# Patient Record
Sex: Female | Born: 1945
Health system: Southern US, Community
[De-identification: ages and names within clinical notes are randomized; demographics above are authoritative.]

## PROBLEM LIST (undated history)

## (undated) DIAGNOSIS — N3941 Urge incontinence: Secondary | ICD-10-CM

## (undated) DIAGNOSIS — Z8719 Personal history of other diseases of the digestive system: Secondary | ICD-10-CM

## (undated) DIAGNOSIS — R413 Other amnesia: Secondary | ICD-10-CM

## (undated) DIAGNOSIS — M1990 Unspecified osteoarthritis, unspecified site: Secondary | ICD-10-CM

## (undated) DIAGNOSIS — F329 Major depressive disorder, single episode, unspecified: Secondary | ICD-10-CM

## (undated) DIAGNOSIS — Z794 Long term (current) use of insulin: Secondary | ICD-10-CM

## (undated) DIAGNOSIS — Z8709 Personal history of other diseases of the respiratory system: Secondary | ICD-10-CM

## (undated) DIAGNOSIS — F319 Bipolar disorder, unspecified: Secondary | ICD-10-CM

## (undated) DIAGNOSIS — Z973 Presence of spectacles and contact lenses: Secondary | ICD-10-CM

## (undated) DIAGNOSIS — E785 Hyperlipidemia, unspecified: Secondary | ICD-10-CM

## (undated) DIAGNOSIS — F419 Anxiety disorder, unspecified: Secondary | ICD-10-CM

## (undated) DIAGNOSIS — Z853 Personal history of malignant neoplasm of breast: Secondary | ICD-10-CM

## (undated) DIAGNOSIS — M5030 Other cervical disc degeneration, unspecified cervical region: Secondary | ICD-10-CM

## (undated) DIAGNOSIS — Z923 Personal history of irradiation: Secondary | ICD-10-CM

## (undated) DIAGNOSIS — N393 Stress incontinence (female) (male): Secondary | ICD-10-CM

## (undated) DIAGNOSIS — E119 Type 2 diabetes mellitus without complications: Secondary | ICD-10-CM

## (undated) DIAGNOSIS — C801 Malignant (primary) neoplasm, unspecified: Secondary | ICD-10-CM

## (undated) DIAGNOSIS — I10 Essential (primary) hypertension: Secondary | ICD-10-CM

## (undated) DIAGNOSIS — E11319 Type 2 diabetes mellitus with unspecified diabetic retinopathy without macular edema: Secondary | ICD-10-CM

## (undated) DIAGNOSIS — N950 Postmenopausal bleeding: Secondary | ICD-10-CM

## (undated) DIAGNOSIS — F32A Depression, unspecified: Secondary | ICD-10-CM

## (undated) DIAGNOSIS — J45909 Unspecified asthma, uncomplicated: Secondary | ICD-10-CM

## (undated) DIAGNOSIS — M21379 Foot drop, unspecified foot: Secondary | ICD-10-CM

## (undated) HISTORY — PX: JOINT REPLACEMENT: SHX530

## (undated) HISTORY — PX: KNEE ARTHROSCOPY: SUR90

## (undated) HISTORY — DX: Other amnesia: R41.3

## (undated) HISTORY — PX: TONSILLECTOMY: SUR1361

## (undated) HISTORY — DX: Foot drop, unspecified foot: M21.379

---

## 1987-05-10 DIAGNOSIS — Z8719 Personal history of other diseases of the digestive system: Secondary | ICD-10-CM

## 1987-05-10 HISTORY — PX: DILATION AND CURETTAGE OF UTERUS: SHX78

## 1987-05-10 HISTORY — DX: Personal history of other diseases of the digestive system: Z87.19

## 1993-05-09 HISTORY — PX: BREAST LUMPECTOMY: SHX2

## 2001-09-04 ENCOUNTER — Encounter: Payer: Self-pay | Admitting: Family Medicine

## 2001-09-04 ENCOUNTER — Ambulatory Visit (HOSPITAL_COMMUNITY): Admission: RE | Admit: 2001-09-04 | Discharge: 2001-09-04 | Payer: Self-pay | Admitting: Family Medicine

## 2002-02-07 ENCOUNTER — Ambulatory Visit (HOSPITAL_COMMUNITY): Admission: RE | Admit: 2002-02-07 | Discharge: 2002-02-07 | Payer: Self-pay | Admitting: Gastroenterology

## 2002-05-22 ENCOUNTER — Encounter: Admission: RE | Admit: 2002-05-22 | Discharge: 2002-05-22 | Payer: Self-pay | Admitting: Family Medicine

## 2002-05-22 ENCOUNTER — Encounter: Payer: Self-pay | Admitting: Family Medicine

## 2002-09-16 ENCOUNTER — Encounter: Payer: Self-pay | Admitting: Family Medicine

## 2002-09-16 ENCOUNTER — Ambulatory Visit (HOSPITAL_COMMUNITY): Admission: RE | Admit: 2002-09-16 | Discharge: 2002-09-16 | Payer: Self-pay | Admitting: Family Medicine

## 2003-10-22 ENCOUNTER — Ambulatory Visit (HOSPITAL_COMMUNITY): Admission: RE | Admit: 2003-10-22 | Discharge: 2003-10-22 | Payer: Self-pay | Admitting: Family Medicine

## 2003-10-27 ENCOUNTER — Encounter: Admission: RE | Admit: 2003-10-27 | Discharge: 2003-10-27 | Payer: Self-pay | Admitting: Family Medicine

## 2004-01-27 ENCOUNTER — Encounter: Admission: RE | Admit: 2004-01-27 | Discharge: 2004-01-27 | Payer: Self-pay | Admitting: Family Medicine

## 2004-07-19 ENCOUNTER — Other Ambulatory Visit: Admission: RE | Admit: 2004-07-19 | Discharge: 2004-07-19 | Payer: Self-pay | Admitting: Family Medicine

## 2004-10-19 ENCOUNTER — Encounter: Admission: RE | Admit: 2004-10-19 | Discharge: 2004-10-19 | Payer: Self-pay | Admitting: Family Medicine

## 2005-01-14 ENCOUNTER — Encounter: Admission: RE | Admit: 2005-01-14 | Discharge: 2005-01-14 | Payer: Self-pay | Admitting: Family Medicine

## 2005-08-26 ENCOUNTER — Other Ambulatory Visit: Admission: RE | Admit: 2005-08-26 | Discharge: 2005-08-26 | Payer: Self-pay | Admitting: Family Medicine

## 2005-08-30 ENCOUNTER — Encounter: Admission: RE | Admit: 2005-08-30 | Discharge: 2005-08-30 | Payer: Self-pay | Admitting: Family Medicine

## 2005-10-27 ENCOUNTER — Encounter: Admission: RE | Admit: 2005-10-27 | Discharge: 2005-10-27 | Payer: Self-pay | Admitting: Family Medicine

## 2005-12-27 ENCOUNTER — Ambulatory Visit (HOSPITAL_COMMUNITY): Admission: RE | Admit: 2005-12-27 | Discharge: 2005-12-27 | Payer: Self-pay | Admitting: Orthopedic Surgery

## 2005-12-27 ENCOUNTER — Encounter: Payer: Self-pay | Admitting: Vascular Surgery

## 2006-09-18 ENCOUNTER — Other Ambulatory Visit: Admission: RE | Admit: 2006-09-18 | Discharge: 2006-09-18 | Payer: Self-pay | Admitting: Family Medicine

## 2006-12-04 ENCOUNTER — Encounter: Admission: RE | Admit: 2006-12-04 | Discharge: 2006-12-04 | Payer: Self-pay | Admitting: Family Medicine

## 2007-11-01 ENCOUNTER — Other Ambulatory Visit: Admission: RE | Admit: 2007-11-01 | Discharge: 2007-11-01 | Payer: Self-pay | Admitting: Family Medicine

## 2007-12-05 ENCOUNTER — Encounter: Admission: RE | Admit: 2007-12-05 | Discharge: 2007-12-05 | Payer: Self-pay | Admitting: Family Medicine

## 2008-01-09 ENCOUNTER — Encounter: Admission: RE | Admit: 2008-01-09 | Discharge: 2008-01-09 | Payer: Self-pay | Admitting: Family Medicine

## 2008-12-31 ENCOUNTER — Encounter: Admission: RE | Admit: 2008-12-31 | Discharge: 2008-12-31 | Payer: Self-pay | Admitting: Family Medicine

## 2009-11-04 ENCOUNTER — Other Ambulatory Visit: Admission: RE | Admit: 2009-11-04 | Discharge: 2009-11-04 | Payer: Self-pay | Admitting: Family Medicine

## 2009-12-16 ENCOUNTER — Encounter: Admission: RE | Admit: 2009-12-16 | Discharge: 2009-12-16 | Payer: Self-pay | Admitting: Family Medicine

## 2010-01-15 ENCOUNTER — Ambulatory Visit (HOSPITAL_COMMUNITY): Admission: RE | Admit: 2010-01-15 | Discharge: 2010-01-15 | Payer: Self-pay | Admitting: Family Medicine

## 2010-09-24 NOTE — Op Note (Signed)
   NAME:  [name], [name]                          ACCOUNT NO.:  [id number]   MEDICAL RECORD NO.:  [id number]                   PATIENT TYPE:  AMB   LOCATION:  ENDO                                 FACILITY:  [facility]   PHYSICIAN:  James L. Malon Kindle., M.D.          DATE OF BIRTH:  1945/09/30   DATE OF PROCEDURE:  02/07/2002  DATE OF DISCHARGE:                                 OPERATIVE REPORT   PROCEDURE:  Colonoscopy.   MEDICATIONS:  Fentanyl 50 mcg, Versed 5 mg IV.   INDICATIONS:  Colon cancer screening and change in bowel habits and  abdominal bloating.   DESCRIPTION OF PROCEDURE:  The procedure had been explained to the patient  and consent obtained.  With the patient in the left lateral decubitus  position, the Olympus pediatric video colonoscope inserted and advanced  under direct visualization.  The prep was adequate.  There was some sticky,  adherent stool.  We were able to reach the cecum and the ileocecal valve was  seen, as well as the appendiceal orifice.  The scope was withdrawn and the  cecum, ascending colon, hepatic flexure, transverse colon, splenic flexure,  descending and sigmoid colon were seen well.  Moderate diverticulosis in the  sigmoid colon.  No polyps or other lesions seen.  The scope was withdrawn.  The patient tolerated the procedure well, maintained on low-flow oxygen and  pulse oximeter throughout the procedure.   ASSESSMENT:  1. No evidence of colon polyps.  2. Moderate diverticulosis.   PLAN:  Will give a diverticulosis handout sheet and check the stool for  blood on a yearly basis.                                                James L. Malon Kindle., M.D.    Waldron Session  D:  02/07/2002  T:  02/07/2002  Job:  [id number]   cc:   Duncan Dull, M.D.  [address]  [place]  [place] [address]  Fax: [phone]

## 2010-12-20 ENCOUNTER — Other Ambulatory Visit (HOSPITAL_COMMUNITY): Payer: Self-pay | Admitting: Family Medicine

## 2010-12-20 DIAGNOSIS — Z1231 Encounter for screening mammogram for malignant neoplasm of breast: Secondary | ICD-10-CM

## 2011-01-17 ENCOUNTER — Ambulatory Visit (HOSPITAL_COMMUNITY)
Admission: RE | Admit: 2011-01-17 | Discharge: 2011-01-17 | Disposition: A | Discharge status: Home / Self Care | Payer: Federal, State, Local not specified - PPO | Source: Ambulatory Visit | Attending: Family Medicine | Admitting: Family Medicine

## 2011-01-17 DIAGNOSIS — Z1231 Encounter for screening mammogram for malignant neoplasm of breast: Secondary | ICD-10-CM | POA: Insufficient documentation

## 2011-05-18 DIAGNOSIS — F200 Paranoid schizophrenia: Secondary | ICD-10-CM | POA: Diagnosis not present

## 2011-07-26 DIAGNOSIS — E7800 Pure hypercholesterolemia, unspecified: Secondary | ICD-10-CM | POA: Diagnosis not present

## 2011-07-26 DIAGNOSIS — R809 Proteinuria, unspecified: Secondary | ICD-10-CM | POA: Diagnosis not present

## 2011-07-26 DIAGNOSIS — I10 Essential (primary) hypertension: Secondary | ICD-10-CM | POA: Diagnosis not present

## 2011-08-03 DIAGNOSIS — M79609 Pain in unspecified limb: Secondary | ICD-10-CM | POA: Diagnosis not present

## 2011-08-03 DIAGNOSIS — B351 Tinea unguium: Secondary | ICD-10-CM | POA: Diagnosis not present

## 2011-10-27 ENCOUNTER — Other Ambulatory Visit: Payer: Self-pay | Admitting: Family Medicine

## 2011-10-27 ENCOUNTER — Other Ambulatory Visit (HOSPITAL_COMMUNITY)
Admission: RE | Admit: 2011-10-27 | Discharge: 2011-10-27 | Disposition: A | Discharge status: Home / Self Care | Payer: Medicare Other | Source: Ambulatory Visit | Attending: Family Medicine | Admitting: Family Medicine

## 2011-10-27 DIAGNOSIS — M899 Disorder of bone, unspecified: Secondary | ICD-10-CM | POA: Diagnosis not present

## 2011-10-27 DIAGNOSIS — Z124 Encounter for screening for malignant neoplasm of cervix: Secondary | ICD-10-CM | POA: Insufficient documentation

## 2011-10-27 DIAGNOSIS — Z0000 Encounter for general adult medical examination without abnormal findings: Secondary | ICD-10-CM | POA: Diagnosis not present

## 2011-10-27 DIAGNOSIS — Z79899 Other long term (current) drug therapy: Secondary | ICD-10-CM | POA: Diagnosis not present

## 2011-10-27 DIAGNOSIS — Z1211 Encounter for screening for malignant neoplasm of colon: Secondary | ICD-10-CM | POA: Diagnosis not present

## 2011-10-27 DIAGNOSIS — E1169 Type 2 diabetes mellitus with other specified complication: Secondary | ICD-10-CM | POA: Diagnosis not present

## 2011-10-27 DIAGNOSIS — E7800 Pure hypercholesterolemia, unspecified: Secondary | ICD-10-CM | POA: Diagnosis not present

## 2011-12-02 DIAGNOSIS — G5760 Lesion of plantar nerve, unspecified lower limb: Secondary | ICD-10-CM | POA: Diagnosis not present

## 2011-12-02 DIAGNOSIS — M19079 Primary osteoarthritis, unspecified ankle and foot: Secondary | ICD-10-CM | POA: Diagnosis not present

## 2011-12-02 DIAGNOSIS — M779 Enthesopathy, unspecified: Secondary | ICD-10-CM | POA: Diagnosis not present

## 2011-12-02 DIAGNOSIS — M79609 Pain in unspecified limb: Secondary | ICD-10-CM | POA: Diagnosis not present

## 2011-12-19 ENCOUNTER — Other Ambulatory Visit (HOSPITAL_COMMUNITY): Payer: Self-pay | Admitting: Family Medicine

## 2011-12-19 DIAGNOSIS — Z1231 Encounter for screening mammogram for malignant neoplasm of breast: Secondary | ICD-10-CM

## 2011-12-23 DIAGNOSIS — M899 Disorder of bone, unspecified: Secondary | ICD-10-CM | POA: Diagnosis not present

## 2012-01-24 ENCOUNTER — Ambulatory Visit (HOSPITAL_COMMUNITY)
Admission: RE | Admit: 2012-01-24 | Discharge: 2012-01-24 | Disposition: A | Discharge status: Home / Self Care | Payer: Medicare Other | Source: Ambulatory Visit | Attending: Family Medicine | Admitting: Family Medicine

## 2012-01-24 DIAGNOSIS — Z1231 Encounter for screening mammogram for malignant neoplasm of breast: Secondary | ICD-10-CM | POA: Diagnosis not present

## 2012-01-26 DIAGNOSIS — E119 Type 2 diabetes mellitus without complications: Secondary | ICD-10-CM | POA: Diagnosis not present

## 2012-01-26 DIAGNOSIS — E7800 Pure hypercholesterolemia, unspecified: Secondary | ICD-10-CM | POA: Diagnosis not present

## 2012-02-02 DIAGNOSIS — I10 Essential (primary) hypertension: Secondary | ICD-10-CM | POA: Diagnosis not present

## 2012-02-02 DIAGNOSIS — Z23 Encounter for immunization: Secondary | ICD-10-CM | POA: Diagnosis not present

## 2012-02-02 DIAGNOSIS — E7800 Pure hypercholesterolemia, unspecified: Secondary | ICD-10-CM | POA: Diagnosis not present

## 2012-02-02 DIAGNOSIS — R809 Proteinuria, unspecified: Secondary | ICD-10-CM | POA: Diagnosis not present

## 2012-02-14 DIAGNOSIS — M1710 Unilateral primary osteoarthritis, unspecified knee: Secondary | ICD-10-CM | POA: Diagnosis not present

## 2012-03-14 DIAGNOSIS — H52209 Unspecified astigmatism, unspecified eye: Secondary | ICD-10-CM | POA: Diagnosis not present

## 2012-03-14 DIAGNOSIS — E119 Type 2 diabetes mellitus without complications: Secondary | ICD-10-CM | POA: Diagnosis not present

## 2012-08-01 DIAGNOSIS — R809 Proteinuria, unspecified: Secondary | ICD-10-CM | POA: Diagnosis not present

## 2012-08-01 DIAGNOSIS — E7800 Pure hypercholesterolemia, unspecified: Secondary | ICD-10-CM | POA: Diagnosis not present

## 2012-08-08 DIAGNOSIS — I10 Essential (primary) hypertension: Secondary | ICD-10-CM | POA: Diagnosis not present

## 2012-08-08 DIAGNOSIS — R809 Proteinuria, unspecified: Secondary | ICD-10-CM | POA: Diagnosis not present

## 2012-08-08 DIAGNOSIS — E7800 Pure hypercholesterolemia, unspecified: Secondary | ICD-10-CM | POA: Diagnosis not present

## 2012-10-02 DIAGNOSIS — Z1211 Encounter for screening for malignant neoplasm of colon: Secondary | ICD-10-CM | POA: Diagnosis not present

## 2012-11-27 DIAGNOSIS — M1710 Unilateral primary osteoarthritis, unspecified knee: Secondary | ICD-10-CM | POA: Diagnosis not present

## 2012-12-10 DIAGNOSIS — R809 Proteinuria, unspecified: Secondary | ICD-10-CM | POA: Diagnosis not present

## 2012-12-10 DIAGNOSIS — I10 Essential (primary) hypertension: Secondary | ICD-10-CM | POA: Diagnosis not present

## 2012-12-10 DIAGNOSIS — E7800 Pure hypercholesterolemia, unspecified: Secondary | ICD-10-CM | POA: Diagnosis not present

## 2013-01-08 DIAGNOSIS — M1710 Unilateral primary osteoarthritis, unspecified knee: Secondary | ICD-10-CM | POA: Diagnosis not present

## 2013-01-15 DIAGNOSIS — M1710 Unilateral primary osteoarthritis, unspecified knee: Secondary | ICD-10-CM | POA: Diagnosis not present

## 2013-01-22 DIAGNOSIS — M1710 Unilateral primary osteoarthritis, unspecified knee: Secondary | ICD-10-CM | POA: Diagnosis not present

## 2013-02-19 DIAGNOSIS — M1710 Unilateral primary osteoarthritis, unspecified knee: Secondary | ICD-10-CM | POA: Diagnosis not present

## 2013-02-20 DIAGNOSIS — E782 Mixed hyperlipidemia: Secondary | ICD-10-CM | POA: Diagnosis not present

## 2013-02-20 DIAGNOSIS — N952 Postmenopausal atrophic vaginitis: Secondary | ICD-10-CM | POA: Diagnosis not present

## 2013-02-20 DIAGNOSIS — Z23 Encounter for immunization: Secondary | ICD-10-CM | POA: Diagnosis not present

## 2013-02-20 DIAGNOSIS — Z853 Personal history of malignant neoplasm of breast: Secondary | ICD-10-CM | POA: Diagnosis not present

## 2013-02-20 DIAGNOSIS — Z79899 Other long term (current) drug therapy: Secondary | ICD-10-CM | POA: Diagnosis not present

## 2013-02-20 DIAGNOSIS — E119 Type 2 diabetes mellitus without complications: Secondary | ICD-10-CM | POA: Diagnosis not present

## 2013-02-20 DIAGNOSIS — M899 Disorder of bone, unspecified: Secondary | ICD-10-CM | POA: Diagnosis not present

## 2013-02-20 DIAGNOSIS — Z0000 Encounter for general adult medical examination without abnormal findings: Secondary | ICD-10-CM | POA: Diagnosis not present

## 2013-03-13 DIAGNOSIS — D235 Other benign neoplasm of skin of trunk: Secondary | ICD-10-CM | POA: Diagnosis not present

## 2013-03-18 ENCOUNTER — Other Ambulatory Visit (HOSPITAL_COMMUNITY): Payer: Self-pay | Admitting: Family Medicine

## 2013-03-18 DIAGNOSIS — Z1231 Encounter for screening mammogram for malignant neoplasm of breast: Secondary | ICD-10-CM

## 2013-03-25 DIAGNOSIS — E7800 Pure hypercholesterolemia, unspecified: Secondary | ICD-10-CM | POA: Diagnosis not present

## 2013-03-27 DIAGNOSIS — Z124 Encounter for screening for malignant neoplasm of cervix: Secondary | ICD-10-CM | POA: Diagnosis not present

## 2013-03-27 DIAGNOSIS — Z1231 Encounter for screening mammogram for malignant neoplasm of breast: Secondary | ICD-10-CM | POA: Diagnosis not present

## 2013-03-27 DIAGNOSIS — Z9189 Other specified personal risk factors, not elsewhere classified: Secondary | ICD-10-CM | POA: Diagnosis not present

## 2013-03-29 ENCOUNTER — Ambulatory Visit (HOSPITAL_COMMUNITY): Payer: Federal, State, Local not specified - PPO

## 2013-04-11 DIAGNOSIS — R809 Proteinuria, unspecified: Secondary | ICD-10-CM | POA: Diagnosis not present

## 2013-04-11 DIAGNOSIS — E7800 Pure hypercholesterolemia, unspecified: Secondary | ICD-10-CM | POA: Diagnosis not present

## 2013-04-15 DIAGNOSIS — R809 Proteinuria, unspecified: Secondary | ICD-10-CM | POA: Diagnosis not present

## 2013-04-15 DIAGNOSIS — E7800 Pure hypercholesterolemia, unspecified: Secondary | ICD-10-CM | POA: Diagnosis not present

## 2013-04-15 DIAGNOSIS — I10 Essential (primary) hypertension: Secondary | ICD-10-CM | POA: Diagnosis not present

## 2013-05-07 DIAGNOSIS — E119 Type 2 diabetes mellitus without complications: Secondary | ICD-10-CM | POA: Diagnosis not present

## 2013-05-07 DIAGNOSIS — H52209 Unspecified astigmatism, unspecified eye: Secondary | ICD-10-CM | POA: Diagnosis not present

## 2013-05-09 HISTORY — PX: KNEE ARTHROSCOPY: SUR90

## 2013-08-07 DIAGNOSIS — IMO0002 Reserved for concepts with insufficient information to code with codable children: Secondary | ICD-10-CM | POA: Diagnosis not present

## 2013-08-07 DIAGNOSIS — M1710 Unilateral primary osteoarthritis, unspecified knee: Secondary | ICD-10-CM | POA: Diagnosis not present

## 2013-08-13 DIAGNOSIS — M1710 Unilateral primary osteoarthritis, unspecified knee: Secondary | ICD-10-CM | POA: Diagnosis not present

## 2013-08-13 DIAGNOSIS — IMO0002 Reserved for concepts with insufficient information to code with codable children: Secondary | ICD-10-CM | POA: Diagnosis not present

## 2013-08-15 DIAGNOSIS — E7800 Pure hypercholesterolemia, unspecified: Secondary | ICD-10-CM | POA: Diagnosis not present

## 2013-08-15 DIAGNOSIS — IMO0001 Reserved for inherently not codable concepts without codable children: Secondary | ICD-10-CM | POA: Diagnosis not present

## 2013-10-21 DIAGNOSIS — IMO0001 Reserved for inherently not codable concepts without codable children: Secondary | ICD-10-CM | POA: Diagnosis not present

## 2013-10-21 DIAGNOSIS — E7800 Pure hypercholesterolemia, unspecified: Secondary | ICD-10-CM | POA: Diagnosis not present

## 2013-10-21 DIAGNOSIS — I10 Essential (primary) hypertension: Secondary | ICD-10-CM | POA: Diagnosis not present

## 2013-10-21 DIAGNOSIS — R809 Proteinuria, unspecified: Secondary | ICD-10-CM | POA: Diagnosis not present

## 2013-11-06 DIAGNOSIS — M1710 Unilateral primary osteoarthritis, unspecified knee: Secondary | ICD-10-CM | POA: Diagnosis not present

## 2013-11-13 DIAGNOSIS — M1710 Unilateral primary osteoarthritis, unspecified knee: Secondary | ICD-10-CM | POA: Diagnosis not present

## 2013-11-20 DIAGNOSIS — M1710 Unilateral primary osteoarthritis, unspecified knee: Secondary | ICD-10-CM | POA: Diagnosis not present

## 2013-12-16 DIAGNOSIS — N9089 Other specified noninflammatory disorders of vulva and perineum: Secondary | ICD-10-CM | POA: Diagnosis not present

## 2014-01-09 DIAGNOSIS — M1710 Unilateral primary osteoarthritis, unspecified knee: Secondary | ICD-10-CM | POA: Diagnosis not present

## 2014-02-14 DIAGNOSIS — E780 Pure hypercholesterolemia: Secondary | ICD-10-CM | POA: Diagnosis not present

## 2014-02-14 DIAGNOSIS — I10 Essential (primary) hypertension: Secondary | ICD-10-CM | POA: Diagnosis not present

## 2014-02-14 DIAGNOSIS — R809 Proteinuria, unspecified: Secondary | ICD-10-CM | POA: Diagnosis not present

## 2014-02-14 DIAGNOSIS — E1165 Type 2 diabetes mellitus with hyperglycemia: Secondary | ICD-10-CM | POA: Diagnosis not present

## 2014-02-21 DIAGNOSIS — Z23 Encounter for immunization: Secondary | ICD-10-CM | POA: Diagnosis not present

## 2014-03-05 DIAGNOSIS — D225 Melanocytic nevi of trunk: Secondary | ICD-10-CM | POA: Diagnosis not present

## 2014-03-05 DIAGNOSIS — L814 Other melanin hyperpigmentation: Secondary | ICD-10-CM | POA: Diagnosis not present

## 2014-03-05 DIAGNOSIS — L821 Other seborrheic keratosis: Secondary | ICD-10-CM | POA: Diagnosis not present

## 2014-03-05 DIAGNOSIS — L817 Pigmented purpuric dermatosis: Secondary | ICD-10-CM | POA: Diagnosis not present

## 2014-03-06 DIAGNOSIS — F339 Major depressive disorder, recurrent, unspecified: Secondary | ICD-10-CM | POA: Diagnosis not present

## 2014-03-06 DIAGNOSIS — Z0000 Encounter for general adult medical examination without abnormal findings: Secondary | ICD-10-CM | POA: Diagnosis not present

## 2014-03-06 DIAGNOSIS — Z23 Encounter for immunization: Secondary | ICD-10-CM | POA: Diagnosis not present

## 2014-03-06 DIAGNOSIS — E782 Mixed hyperlipidemia: Secondary | ICD-10-CM | POA: Diagnosis not present

## 2014-03-06 DIAGNOSIS — M8580 Other specified disorders of bone density and structure, unspecified site: Secondary | ICD-10-CM | POA: Diagnosis not present

## 2014-03-06 DIAGNOSIS — E1165 Type 2 diabetes mellitus with hyperglycemia: Secondary | ICD-10-CM | POA: Diagnosis not present

## 2014-03-06 DIAGNOSIS — I10 Essential (primary) hypertension: Secondary | ICD-10-CM | POA: Diagnosis not present

## 2014-04-25 DIAGNOSIS — Z1231 Encounter for screening mammogram for malignant neoplasm of breast: Secondary | ICD-10-CM | POA: Diagnosis not present

## 2014-04-25 DIAGNOSIS — Z01419 Encounter for gynecological examination (general) (routine) without abnormal findings: Secondary | ICD-10-CM | POA: Diagnosis not present

## 2014-05-07 DIAGNOSIS — E119 Type 2 diabetes mellitus without complications: Secondary | ICD-10-CM | POA: Diagnosis not present

## 2014-05-07 DIAGNOSIS — H524 Presbyopia: Secondary | ICD-10-CM | POA: Diagnosis not present

## 2014-05-07 DIAGNOSIS — H52202 Unspecified astigmatism, left eye: Secondary | ICD-10-CM | POA: Diagnosis not present

## 2014-06-13 DIAGNOSIS — E11359 Type 2 diabetes mellitus with proliferative diabetic retinopathy without macular edema: Secondary | ICD-10-CM | POA: Diagnosis not present

## 2014-06-13 DIAGNOSIS — Z01810 Encounter for preprocedural cardiovascular examination: Secondary | ICD-10-CM | POA: Diagnosis not present

## 2014-06-13 DIAGNOSIS — Z853 Personal history of malignant neoplasm of breast: Secondary | ICD-10-CM | POA: Diagnosis not present

## 2014-06-13 DIAGNOSIS — E785 Hyperlipidemia, unspecified: Secondary | ICD-10-CM | POA: Diagnosis not present

## 2014-06-17 ENCOUNTER — Ambulatory Visit: Payer: Self-pay | Admitting: Orthopedic Surgery

## 2014-06-17 DIAGNOSIS — E780 Pure hypercholesterolemia: Secondary | ICD-10-CM | POA: Diagnosis not present

## 2014-06-17 DIAGNOSIS — R809 Proteinuria, unspecified: Secondary | ICD-10-CM | POA: Diagnosis not present

## 2014-06-17 DIAGNOSIS — E1165 Type 2 diabetes mellitus with hyperglycemia: Secondary | ICD-10-CM | POA: Diagnosis not present

## 2014-06-26 DIAGNOSIS — E11359 Type 2 diabetes mellitus with proliferative diabetic retinopathy without macular edema: Secondary | ICD-10-CM | POA: Diagnosis not present

## 2014-06-26 DIAGNOSIS — E785 Hyperlipidemia, unspecified: Secondary | ICD-10-CM | POA: Diagnosis not present

## 2014-06-26 DIAGNOSIS — Z01810 Encounter for preprocedural cardiovascular examination: Secondary | ICD-10-CM | POA: Diagnosis not present

## 2014-06-26 DIAGNOSIS — Z853 Personal history of malignant neoplasm of breast: Secondary | ICD-10-CM | POA: Diagnosis not present

## 2014-06-30 ENCOUNTER — Other Ambulatory Visit (HOSPITAL_COMMUNITY): Payer: Self-pay | Admitting: *Deleted

## 2014-06-30 NOTE — Patient Instructions (Addendum)
[name]  06/30/2014   Your procedure is scheduled on: 07/07/14   Report to [facility] Main  Entrance and follow signs to               [place] at 8:30 AM.   Call this number if you have problems the morning of surgery [phone]   Remember:  Do not eat food or drink liquids :After Midnight.    Take these medicines the morning of surgery with A SIP OF WATER: ALLEGRA IF NEEDED                                You may not have any metal on your body including hair pins and              piercings  Do not wear jewelry, make-up, lotions, powders or perfumes.             Do not wear nail polish.  Do not shave  48 hours prior to surgery.              Men may shave face and neck.   Do not bring valuables to the hospital. [place].  Contacts, dentures or bridgework may not be worn into surgery.  Leave suitcase in the car. After surgery it may be brought to your room.     Patients discharged the day of surgery will not be allowed to drive home.  Name and phone number of your driver:  Special Instructions: N/A              Please read over the following fact sheets you were given: _____________________________________________________________________                                                     [place]  Before surgery, you can play an important role.  Because skin is not sterile, your skin needs to be as free of germs as possible.  You can reduce the number of germs on your skin by washing with CHG (chlorahexidine gluconate) soap before surgery.  CHG is an antiseptic cleaner which kills germs and bonds with the skin to continue killing germs even after washing. Please DO NOT use if you have an allergy to CHG or antibacterial soaps.  If your skin becomes reddened/irritated stop using the CHG and inform your nurse when you arrive at Short Stay. Do not shave  (including legs and underarms) for at least 48 hours prior to the first CHG shower.  You may shave your face. Please follow these instructions carefully:   1.  Shower with CHG Soap the night before surgery and the  morning of Surgery.   2.  If you choose to wash your hair, wash your hair first as usual with your  normal  Shampoo.   3.  After you shampoo, rinse your hair and body thoroughly to remove the  shampoo.  4.  Use CHG as you would any other liquid soap.  You can apply chg directly  to the skin and wash . Gently wash with scrungie or clean wascloth    5.  Apply the CHG Soap to your body ONLY FROM THE NECK DOWN.   Do not use on open                           Wound or open sores. Avoid contact with eyes, ears mouth and genitals (private parts).                        Genitals (private parts) with your normal soap.              6.  Wash thoroughly, paying special attention to the area where your surgery  will be performed.   7.  Thoroughly rinse your body with warm water from the neck down.   8.  DO NOT shower/wash with your normal soap after using and rinsing off  the CHG Soap .                9.  Pat yourself dry with a clean towel.             10.  Wear clean pajamas.             11.  Place clean sheets on your bed the night of your first shower and do not  sleep with pets.  Day of Surgery : Do not apply any lotions/deodorants the morning of surgery.  Please wear clean clothes to the hospital/surgery center.  FAILURE TO FOLLOW THESE INSTRUCTIONS MAY RESULT IN THE CANCELLATION OF YOUR SURGERY    PATIENT SIGNATURE_________________________________  ______________________________________________________________________     [name]  An incentive spirometer is a tool that can help keep your lungs clear and active. This tool measures how well you are filling your lungs with each breath. Taking long deep breaths may help  reverse or decrease the chance of developing breathing (pulmonary) problems (especially infection) following:  A long period of time when you are unable to move or be active. BEFORE THE PROCEDURE   If the spirometer includes an indicator to show your best effort, your nurse or respiratory therapist will set it to a desired goal.  If possible, sit up straight or lean slightly forward. Try not to slouch.  Hold the incentive spirometer in an upright position. INSTRUCTIONS FOR USE   Sit on the edge of your bed if possible, or sit up as far as you can in bed or on a chair.  Hold the incentive spirometer in an upright position.  Breathe out normally.  Place the mouthpiece in your mouth and seal your lips tightly around it.  Breathe in slowly and as deeply as possible, raising the piston or the ball toward the top of the column.  Hold your breath for 3-5 seconds or for as long as possible. Allow the piston or ball to fall to the bottom of the column.  Remove the mouthpiece from your mouth and breathe out normally.  Rest for a few seconds and repeat Steps 1 through 7 at least 10 times every 1-2 hours when you are awake. Take your time and take a few normal breaths between deep breaths.  The spirometer may include an indicator to show your best effort. Use the indicator as a goal to work toward during  each repetition.  After each set of 10 deep breaths, practice coughing to be sure your lungs are clear. If you have an incision (the cut made at the time of surgery), support your incision when coughing by placing a pillow or rolled up towels firmly against it. Once you are able to get out of bed, walk around indoors and cough well. You may stop using the incentive spirometer when instructed by your caregiver.  RISKS AND COMPLICATIONS  Take your time so you do not get dizzy or light-headed.  If you are in pain, you may need to take or ask for pain medication before doing incentive spirometry.  It is harder to take a deep breath if you are having pain. AFTER USE  Rest and breathe slowly and easily.  It can be helpful to keep track of a log of your progress. Your caregiver can provide you with a simple table to help with this. If you are using the spirometer at home, follow these instructions: [place] IF:   You are having difficultly using the spirometer.  You have trouble using the spirometer as often as instructed.  Your pain medication is not giving enough relief while using the spirometer.  You develop fever of 100.5 F (38.1 C) or higher. SEEK IMMEDIATE MEDICAL CARE IF:   You cough up bloody sputum that had not been present before.  You develop fever of 102 F (38.9 C) or greater.  You develop worsening pain at or near the incision site. MAKE SURE YOU:   Understand these instructions.  Will watch your condition.  Will get help right away if you are not doing well or get worse. Document Released: 09/05/2006 Document Revised: 07/18/2011 Document Reviewed: 11/06/2006 Exit[employer] Patient Information 2014 [employer], [place].   ________________________________________________________________________  WHAT IS A BLOOD TRANSFUSION? Blood Transfusion Information  A transfusion is the replacement of blood or some of its parts. Blood is made up of multiple cells which provide different functions.  Red blood cells carry oxygen and are used for blood loss replacement.  White blood cells fight against infection.  Platelets control bleeding.  Plasma helps clot blood.  Other blood products are available for specialized needs, such as hemophilia or other clotting disorders. BEFORE THE TRANSFUSION  Who gives blood for transfusions?   Healthy volunteers who are fully evaluated to make sure their blood is safe. This is blood bank blood. Transfusion therapy is the safest it has ever been in the practice of medicine. Before blood is taken from a donor, a complete  history is taken to make sure that person has no history of diseases nor engages in risky social behavior (examples are intravenous drug use or sexual activity with multiple partners). The donor's travel history is screened to minimize risk of transmitting infections, such as malaria. The donated blood is tested for signs of infectious diseases, such as HIV and hepatitis. The blood is then tested to be sure it is compatible with you in order to minimize the chance of a transfusion reaction. If you or a relative donates blood, this is often done in anticipation of surgery and is not appropriate for emergency situations. It takes many days to process the donated blood. RISKS AND COMPLICATIONS Although transfusion therapy is very safe and saves many lives, the main dangers of transfusion include:   Getting an infectious disease.  Developing a transfusion reaction. This is an allergic reaction to something in the blood you were given. Every precaution is taken to prevent  this. The decision to have a blood transfusion has been considered carefully by your caregiver before blood is given. Blood is not given unless the benefits outweigh the risks. AFTER THE TRANSFUSION  Right after receiving a blood transfusion, you will usually feel much better and more energetic. This is especially true if your red blood cells have gotten low (anemic). The transfusion raises the level of the red blood cells which carry oxygen, and this usually causes an energy increase.  The nurse administering the transfusion will monitor you carefully for complications. HOME CARE INSTRUCTIONS  No special instructions are needed after a transfusion. You may find your energy is better. Speak with your caregiver about any limitations on activity for underlying diseases you may have. SEEK MEDICAL CARE IF:   Your condition is not improving after your transfusion.  You develop redness or irritation at the intravenous (IV) site. SEEK  IMMEDIATE MEDICAL CARE IF:  Any of the following symptoms occur over the next 12 hours:  Shaking chills.  You have a temperature by mouth above 102 F (38.9 C), not controlled by medicine.  Chest, back, or muscle pain.  People around you feel you are not acting correctly or are confused.  Shortness of breath or difficulty breathing.  Dizziness and fainting.  You get a rash or develop hives.  You have a decrease in urine output.  Your urine turns a dark color or changes to pink, red, or brown. Any of the following symptoms occur over the next 10 days:  You have a temperature by mouth above 102 F (38.9 C), not controlled by medicine.  Shortness of breath.  Weakness after normal activity.  The white part of the eye turns yellow (jaundice).  You have a decrease in the amount of urine or are urinating less often.  Your urine turns a dark color or changes to pink, red, or brown. Document Released: 04/22/2000 Document Revised: 07/18/2011 Document Reviewed: 12/10/2007 [facility] Patient Information 2014 [place], [place].  _______________________________________________________________________

## 2014-07-01 ENCOUNTER — Encounter (HOSPITAL_COMMUNITY)
Admission: RE | Admit: 2014-07-01 | Discharge: 2014-07-01 | Disposition: A | Discharge status: Home / Self Care | Payer: Medicare Other | Source: Ambulatory Visit | Attending: Orthopedic Surgery | Admitting: Orthopedic Surgery

## 2014-07-01 ENCOUNTER — Encounter (HOSPITAL_COMMUNITY): Payer: Self-pay

## 2014-07-01 DIAGNOSIS — M179 Osteoarthritis of knee, unspecified: Secondary | ICD-10-CM | POA: Insufficient documentation

## 2014-07-01 DIAGNOSIS — Z01818 Encounter for other preprocedural examination: Secondary | ICD-10-CM | POA: Diagnosis not present

## 2014-07-01 HISTORY — DX: Bipolar disorder, unspecified: F31.9

## 2014-07-01 HISTORY — DX: Anxiety disorder, unspecified: F41.9

## 2014-07-01 HISTORY — DX: Personal history of malignant neoplasm of breast: Z85.3

## 2014-07-01 HISTORY — DX: Hyperlipidemia, unspecified: E78.5

## 2014-07-01 HISTORY — DX: Major depressive disorder, single episode, unspecified: F32.9

## 2014-07-01 HISTORY — DX: Type 2 diabetes mellitus without complications: E11.9

## 2014-07-01 HISTORY — DX: Stress incontinence (female) (male): N39.3

## 2014-07-01 HISTORY — DX: Unspecified osteoarthritis, unspecified site: M19.90

## 2014-07-01 HISTORY — DX: Personal history of other diseases of the digestive system: Z87.19

## 2014-07-01 HISTORY — DX: Essential (primary) hypertension: I10

## 2014-07-01 HISTORY — DX: Malignant (primary) neoplasm, unspecified: C80.1

## 2014-07-01 HISTORY — DX: Depression, unspecified: F32.A

## 2014-07-01 LAB — URINALYSIS, ROUTINE W REFLEX MICROSCOPIC
BILIRUBIN URINE: NEGATIVE
GLUCOSE, UA: NEGATIVE mg/dL
HGB URINE DIPSTICK: NEGATIVE
KETONES UR: NEGATIVE mg/dL
Leukocytes, UA: NEGATIVE
Nitrite: NEGATIVE
Protein, ur: NEGATIVE mg/dL
Specific Gravity, Urine: 1.014 (ref 1.005–1.030)
Urobilinogen, UA: 0.2 mg/dL (ref 0.0–1.0)
pH: 5.5 (ref 5.0–8.0)

## 2014-07-01 LAB — PROTIME-INR
INR: 0.92 (ref 0.00–1.49)
Prothrombin Time: 12.4 seconds (ref 11.6–15.2)

## 2014-07-01 LAB — CBC
HCT: 36.7 % (ref 36.0–46.0)
Hemoglobin: 12 g/dL (ref 12.0–15.0)
MCH: 28.1 pg (ref 26.0–34.0)
MCHC: 32.7 g/dL (ref 30.0–36.0)
MCV: 85.9 fL (ref 78.0–100.0)
Platelets: 331 10*3/uL (ref 150–400)
RBC: 4.27 MIL/uL (ref 3.87–5.11)
RDW: 13.9 % (ref 11.5–15.5)
WBC: 4.9 10*3/uL (ref 4.0–10.5)

## 2014-07-01 LAB — COMPREHENSIVE METABOLIC PANEL
ALK PHOS: 41 U/L (ref 39–117)
ALT: 14 U/L (ref 0–35)
AST: 22 U/L (ref 0–37)
Albumin: 4.2 g/dL (ref 3.5–5.2)
Anion gap: 10 (ref 5–15)
BUN: 18 mg/dL (ref 6–23)
CO2: 29 mmol/L (ref 19–32)
Calcium: 10.1 mg/dL (ref 8.4–10.5)
Chloride: 102 mmol/L (ref 96–112)
Creatinine, Ser: 0.68 mg/dL (ref 0.50–1.10)
GFR calc non Af Amer: 87 mL/min — ABNORMAL LOW (ref >90)
GLUCOSE: 80 mg/dL (ref 70–99)
POTASSIUM: 5.1 mmol/L (ref 3.5–5.1)
SODIUM: 141 mmol/L (ref 135–145)
TOTAL PROTEIN: 7.1 g/dL (ref 6.0–8.3)
Total Bilirubin: 0.5 mg/dL (ref 0.3–1.2)

## 2014-07-01 LAB — APTT: aPTT: 27 seconds (ref 24–37)

## 2014-07-01 LAB — ABO/RH: ABO/RH(D): A POS

## 2014-07-01 LAB — SURGICAL PCR SCREEN
MRSA, PCR: NEGATIVE
STAPHYLOCOCCUS AUREUS: NEGATIVE

## 2014-07-01 NOTE — Progress Notes (Signed)
EKG from office on chart with stress test / clearance from Dr. Wynonia Lawman

## 2014-07-01 NOTE — H&P (Signed)
TOTAL KNEE ADMISSION H&P  Patient is being admitted for right total knee arthroplasty.  Subjective:  Chief Complaint:right knee pain.  HPI: [name], 69 y.o. female, has a history of pain and functional disability in the right knee due to arthritis and has failed non-surgical conservative treatments for greater than 12 weeks to includeNSAID's and/or analgesics, corticosteriod injections, viscosupplementation injections and activity modification.  Onset of symptoms was gradual, starting 9 years ago with gradually worsening course since that time. The patient noted prior procedures on the knee to include  arthroscopy and menisectomy on the right knee(s).  Patient currently rates pain in the right knee(s) at 8 out of 10 with activity. Patient has night pain, worsening of pain with activity and weight bearing, pain that interferes with activities of daily living, pain with passive range of motion, crepitus and joint swelling.  Patient has evidence of periarticular osteophytes and joint space narrowing by imaging studies. There is no active infection.   Past Medical History  Diagnosis Date  . Hypertension   . Hyperlipidemia   . Arthritis   . History of duodenal ulcer 1989  . Stress incontinence   . Diabetes mellitus without complication   . Anxiety   . Depression   . Bipolar 1 disorder   . History of breast cancer 1995    lumpectomy and radiation left breast  . Cancer     hx breast cancer    Past Surgical History  Procedure Laterality Date  . Tonsillectomy    . Dilation and curettage of uterus  1989  . Knee arthroscopy      rt knee  . Breast lumpectomy  1995    left breast / radiation     Current outpatient prescriptions:  .  acetaminophen (TYLENOL) 500 MG tablet, Take 500-1,000 mg by mouth every 6 (six) hours as needed (For pain.)., Disp: , Rfl:  .  ARIPiprazole (ABILIFY) 30 MG tablet, Take 30 mg by mouth at bedtime., Disp: , Rfl:  .  aspirin EC 81 MG tablet, Take 81 mg by  mouth at bedtime., Disp: , Rfl:  .  Calcium Carbonate-Vitamin D 500-125 MG-UNIT TABS, Take 2 tablets by mouth daily with supper., Disp: , Rfl:  .  clonazePAM (KLONOPIN) 1 MG tablet, Take 2-3 mg by mouth at bedtime., Disp: , Rfl:  .  Coenzyme Q10 200 MG capsule, Take 200 mg by mouth daily after breakfast., Disp: , Rfl:  .  fexofenadine (ALLEGRA) 180 MG tablet, Take 180 mg by mouth daily as needed for allergies., Disp: , Rfl:  .  ibuprofen (ADVIL,MOTRIN) 200 MG tablet, Take 200 mg by mouth every 6 (six) hours as needed (For pain.)., Disp: , Rfl:  .  insulin lispro protamine-lispro (HUMALOG 75/25 MIX) (75-25) 100 UNIT/ML SUSP injection, Inject 10-14 Units into the skin 2 (two) times daily. She takes 10 units in the morning and 14 units in the evening., Disp: , Rfl:  .  Krill Oil 300 MG CAPS, Take 600 mg by mouth daily after supper., Disp: , Rfl:  .  metFORMIN (GLUCOPHAGE) 500 MG tablet, Take 1,000 mg by mouth 2 (two) times daily after a meal., Disp: , Rfl:  .  Multiple Vitamin (MULTIVITAMIN WITH MINERALS) TABS tablet, Take 1 tablet by mouth daily with supper., Disp: , Rfl:  .  OVER THE COUNTER MEDICATION, Take 1,000 mg by mouth every morning. Cumin Oil and Tumeric Capsule 500mg , Disp: , Rfl:  .  pravastatin (PRAVACHOL) 20 MG tablet, Take 20 mg by  mouth daily after breakfast., Disp: , Rfl:  .  ramipril (ALTACE) 5 MG capsule, Take 5 mg by mouth at bedtime., Disp: , Rfl:  .  sitaGLIPtin (JANUVIA) 100 MG tablet, Take 100 mg by mouth daily with breakfast., Disp: , Rfl:   Allergies  Allergen Reactions  . Latex Rash  . Penicillins Rash  . Sulfa Antibiotics Rash    History  Substance Use Topics  . Smoking status: Never Smoker   . Smokeless tobacco: No  . Alcohol Use: Yes     Comment: occasional      Review of Systems  Constitutional: Negative.   HENT: Negative.   Eyes: Negative.   Respiratory: Positive for shortness of breath. Negative for cough, hemoptysis, sputum production and wheezing.         SOB with exertion  Cardiovascular: Negative.   Gastrointestinal: Positive for constipation. Negative for heartburn, nausea, vomiting, abdominal pain, diarrhea, blood in stool and melena.  Genitourinary: Positive for frequency. Negative for dysuria, urgency, hematuria and flank pain.       Positive for incontinence  Musculoskeletal: Positive for joint pain. Negative for myalgias, back pain, falls and neck pain.       Right knee pain  Skin: Negative.   Neurological: Negative.   Endo/Heme/Allergies: Negative.   Psychiatric/Behavioral: Negative.     Objective:  Physical Exam  Constitutional: She is oriented to person, place, and time. She appears well-developed. No distress.  Overweight  HENT:  Head: Normocephalic and atraumatic.  Right Ear: External ear normal.  Left Ear: External ear normal.  Mouth/Throat: Oropharynx is clear and moist.  Eyes: Conjunctivae are normal.  Neck: Normal range of motion. Neck supple.  Cardiovascular: Normal rate, regular rhythm, normal heart sounds and intact distal pulses.   No murmur heard. Respiratory: Effort normal and breath sounds normal. No respiratory distress. She has no wheezes.  GI: Soft. Bowel sounds are normal. She exhibits no distension. There is no tenderness.  Musculoskeletal:       Right hip: Normal.       Left hip: Normal.       Right knee: She exhibits decreased range of motion and swelling. She exhibits no effusion and no erythema. Tenderness found. Medial joint line and lateral joint line tenderness noted.       Left knee: Normal.       Right lower leg: She exhibits no tenderness and no swelling.       Left lower leg: She exhibits no tenderness and no swelling.  There is marked crepitus on range of motion in the right knee. Range of motion about 10-120 degrees. There is no instability.  Neurological: She is alert and oriented to person, place, and time. She has normal strength and normal reflexes. No sensory deficit.  Skin: No  rash noted. She is not diaphoretic. No erythema.  Psychiatric: She has a normal mood and affect. Her behavior is normal.    Vital signs in last 24 hours: Temp:  [98.6 F (37 C)] 98.6 F (37 C) (02/23 0910) Pulse Rate:  [68] 68 (02/23 0910) Resp:  [16] 16 (02/23 0910) BP: (112)/(59) 112/59 mmHg (02/23 0910) SpO2:  [98 %] 98 % (02/23 0910) Weight:  [79.153 kg (174 lb 8 oz)] 79.153 kg (174 lb 8 oz) (02/23 0910)    Imaging Review Plain radiographs demonstrate severe degenerative joint disease of the right knee(s). The overall alignment ismild varus. The bone quality appears to be good for age and reported activity level.  Assessment/Plan:  End stage primary osteoarthritis, right knee   The patient history, physical examination, clinical judgment of the provider and imaging studies are consistent with end stage degenerative joint disease of the right knee(s) and total knee arthroplasty is deemed medically necessary. The treatment options including medical management, injection therapy arthroscopy and arthroplasty were discussed at length. The risks and benefits of total knee arthroplasty were presented and reviewed. The risks due to aseptic loosening, infection, stiffness, patella tracking problems, thromboembolic complications and other imponderables were discussed. The patient acknowledged the explanation, agreed to proceed with the plan and consent was signed. Patient is being admitted for inpatient treatment for surgery, pain control, PT, OT, prophylactic antibiotics, VTE prophylaxis, progressive ambulation and ADL's and discharge planning. The patient is planning to be discharged home with home health services    TXA IV  PCP: Dr. Darcus Austin Cardio. Dr. Carolan Shiver, PA-C

## 2014-07-07 ENCOUNTER — Encounter (HOSPITAL_COMMUNITY)
Admission: RE | Disposition: A | Discharge status: Home Health | Payer: Self-pay | Source: Ambulatory Visit | Attending: Orthopedic Surgery

## 2014-07-07 ENCOUNTER — Inpatient Hospital Stay (HOSPITAL_COMMUNITY): Payer: Medicare Other | Admitting: Anesthesiology

## 2014-07-07 ENCOUNTER — Inpatient Hospital Stay (HOSPITAL_COMMUNITY)
Admission: RE | Admit: 2014-07-07 | Discharge: 2014-07-09 | DRG: 470 | Disposition: A | Discharge status: Home Health | Payer: Medicare Other | Source: Ambulatory Visit | Attending: Orthopedic Surgery | Admitting: Orthopedic Surgery

## 2014-07-07 ENCOUNTER — Encounter (HOSPITAL_COMMUNITY): Payer: Self-pay | Admitting: *Deleted

## 2014-07-07 DIAGNOSIS — E785 Hyperlipidemia, unspecified: Secondary | ICD-10-CM | POA: Diagnosis present

## 2014-07-07 DIAGNOSIS — Z8711 Personal history of peptic ulcer disease: Secondary | ICD-10-CM

## 2014-07-07 DIAGNOSIS — E119 Type 2 diabetes mellitus without complications: Secondary | ICD-10-CM | POA: Diagnosis present

## 2014-07-07 DIAGNOSIS — M1990 Unspecified osteoarthritis, unspecified site: Secondary | ICD-10-CM | POA: Diagnosis not present

## 2014-07-07 DIAGNOSIS — M179 Osteoarthritis of knee, unspecified: Secondary | ICD-10-CM | POA: Diagnosis not present

## 2014-07-07 DIAGNOSIS — Z79899 Other long term (current) drug therapy: Secondary | ICD-10-CM

## 2014-07-07 DIAGNOSIS — Z794 Long term (current) use of insulin: Secondary | ICD-10-CM | POA: Diagnosis not present

## 2014-07-07 DIAGNOSIS — M25561 Pain in right knee: Secondary | ICD-10-CM | POA: Diagnosis not present

## 2014-07-07 DIAGNOSIS — Z7982 Long term (current) use of aspirin: Secondary | ICD-10-CM | POA: Diagnosis not present

## 2014-07-07 DIAGNOSIS — F319 Bipolar disorder, unspecified: Secondary | ICD-10-CM | POA: Diagnosis present

## 2014-07-07 DIAGNOSIS — Z853 Personal history of malignant neoplasm of breast: Secondary | ICD-10-CM

## 2014-07-07 DIAGNOSIS — I10 Essential (primary) hypertension: Secondary | ICD-10-CM | POA: Diagnosis present

## 2014-07-07 DIAGNOSIS — M1711 Unilateral primary osteoarthritis, right knee: Principal | ICD-10-CM | POA: Diagnosis present

## 2014-07-07 DIAGNOSIS — M1710 Unilateral primary osteoarthritis, unspecified knee: Secondary | ICD-10-CM | POA: Diagnosis present

## 2014-07-07 DIAGNOSIS — Z01812 Encounter for preprocedural laboratory examination: Secondary | ICD-10-CM

## 2014-07-07 HISTORY — PX: TOTAL KNEE ARTHROPLASTY: SHX125

## 2014-07-07 LAB — GLUCOSE, CAPILLARY
GLUCOSE-CAPILLARY: 107 mg/dL — AB (ref 70–99)
Glucose-Capillary: 111 mg/dL — ABNORMAL HIGH (ref 70–99)
Glucose-Capillary: 108 mg/dL — ABNORMAL HIGH (ref 70–99)
Glucose-Capillary: 161 mg/dL — ABNORMAL HIGH (ref 70–99)
Glucose-Capillary: 326 mg/dL — ABNORMAL HIGH (ref 70–99)

## 2014-07-07 LAB — TYPE AND SCREEN
ABO/RH(D): A POS
Antibody Screen: NEGATIVE

## 2014-07-07 SURGERY — ARTHROPLASTY, KNEE, TOTAL
Anesthesia: Spinal | Site: Knee | Laterality: Right

## 2014-07-07 MED ORDER — INSULIN ASPART PROT & ASPART (70-30 MIX) 100 UNIT/ML ~~LOC~~ SUSP
10.0000 [IU] | Freq: Every day | SUBCUTANEOUS | Status: DC
  Administered 2014-07-08 – 2014-07-09 (×2): 10 [IU] via SUBCUTANEOUS
  Filled 2014-07-07: qty 10

## 2014-07-07 MED ORDER — MIDAZOLAM HCL 2 MG/2ML IJ SOLN
INTRAMUSCULAR | Status: AC
  Filled 2014-07-07: qty 2

## 2014-07-07 MED ORDER — BUPIVACAINE HCL (PF) 0.25 % IJ SOLN
INTRAMUSCULAR | Status: AC
  Filled 2014-07-07: qty 30

## 2014-07-07 MED ORDER — 0.9 % SODIUM CHLORIDE (POUR BTL) OPTIME
TOPICAL | Status: DC | PRN
  Administered 2014-07-07: 1000 mL

## 2014-07-07 MED ORDER — TRAMADOL HCL 50 MG PO TABS: 50.0000 mg | ORAL_TABLET | Freq: Four times a day (QID) | ORAL | Status: DC | PRN

## 2014-07-07 MED ORDER — FLEET ENEMA 7-19 GM/118ML RE ENEM: 1.0000 | ENEMA | Freq: Once | RECTAL | Status: AC | PRN

## 2014-07-07 MED ORDER — BISACODYL 10 MG RE SUPP: 10.0000 mg | Freq: Every day | RECTAL | Status: DC | PRN

## 2014-07-07 MED ORDER — INSULIN ASPART 100 UNIT/ML ~~LOC~~ SOLN
5.0000 [IU] | Freq: Once | SUBCUTANEOUS | Status: AC
  Administered 2014-07-07: 5 [IU] via SUBCUTANEOUS

## 2014-07-07 MED ORDER — PHENOL 1.4 % MT LIQD
1.0000 | OROMUCOSAL | Status: DC | PRN
  Filled 2014-07-07: qty 177

## 2014-07-07 MED ORDER — ACETAMINOPHEN 10 MG/ML IV SOLN
1000.0000 mg | Freq: Once | INTRAVENOUS | Status: AC
  Administered 2014-07-07: 1000 mg via INTRAVENOUS
  Filled 2014-07-07: qty 100

## 2014-07-07 MED ORDER — RIVAROXABAN 10 MG PO TABS
10.0000 mg | ORAL_TABLET | Freq: Every day | ORAL | Status: DC
  Administered 2014-07-08 – 2014-07-09 (×2): 10 mg via ORAL
  Filled 2014-07-07 (×3): qty 1

## 2014-07-07 MED ORDER — PHENYLEPHRINE HCL 10 MG/ML IJ SOLN
INTRAMUSCULAR | Status: DC | PRN
  Administered 2014-07-07 (×3): 80 ug via INTRAVENOUS

## 2014-07-07 MED ORDER — ONDANSETRON HCL 4 MG/2ML IJ SOLN: 4.0000 mg | Freq: Four times a day (QID) | INTRAMUSCULAR | Status: DC | PRN

## 2014-07-07 MED ORDER — MIDAZOLAM HCL 5 MG/5ML IJ SOLN
INTRAMUSCULAR | Status: DC | PRN
  Administered 2014-07-07: 1 mg via INTRAVENOUS

## 2014-07-07 MED ORDER — METHOCARBAMOL 500 MG PO TABS
500.0000 mg | ORAL_TABLET | Freq: Four times a day (QID) | ORAL | Status: DC | PRN
  Administered 2014-07-08 – 2014-07-09 (×2): 500 mg via ORAL
  Filled 2014-07-07 (×3): qty 1

## 2014-07-07 MED ORDER — FENTANYL CITRATE 0.05 MG/ML IJ SOLN
INTRAMUSCULAR | Status: AC
  Filled 2014-07-07: qty 2

## 2014-07-07 MED ORDER — SODIUM CHLORIDE 0.9 % IJ SOLN
INTRAMUSCULAR | Status: DC | PRN
  Administered 2014-07-07: 30 mL

## 2014-07-07 MED ORDER — CEFAZOLIN SODIUM-DEXTROSE 2-3 GM-% IV SOLR
2.0000 g | Freq: Four times a day (QID) | INTRAVENOUS | Status: AC
  Administered 2014-07-07 (×2): 2 g via INTRAVENOUS
  Filled 2014-07-07 (×2): qty 50

## 2014-07-07 MED ORDER — TRANEXAMIC ACID 100 MG/ML IV SOLN
1000.0000 mg | INTRAVENOUS | Status: AC
  Administered 2014-07-07: 1000 mg via INTRAVENOUS
  Filled 2014-07-07: qty 10

## 2014-07-07 MED ORDER — PROPOFOL 10 MG/ML IV BOLUS
INTRAVENOUS | Status: AC
  Filled 2014-07-07: qty 20

## 2014-07-07 MED ORDER — METOCLOPRAMIDE HCL 10 MG PO TABS: 5.0000 mg | ORAL_TABLET | Freq: Three times a day (TID) | ORAL | Status: DC | PRN

## 2014-07-07 MED ORDER — DOCUSATE SODIUM 100 MG PO CAPS
100.0000 mg | ORAL_CAPSULE | Freq: Two times a day (BID) | ORAL | Status: DC
  Administered 2014-07-07 – 2014-07-09 (×5): 100 mg via ORAL

## 2014-07-07 MED ORDER — CEFAZOLIN SODIUM-DEXTROSE 2-3 GM-% IV SOLR
INTRAVENOUS | Status: AC
  Filled 2014-07-07: qty 50

## 2014-07-07 MED ORDER — POLYETHYLENE GLYCOL 3350 17 G PO PACK: 17.0000 g | PACK | Freq: Every day | ORAL | Status: DC | PRN

## 2014-07-07 MED ORDER — DIPHENHYDRAMINE HCL 12.5 MG/5ML PO ELIX: 12.5000 mg | ORAL_SOLUTION | ORAL | Status: DC | PRN

## 2014-07-07 MED ORDER — BUPIVACAINE IN DEXTROSE 0.75-8.25 % IT SOLN
INTRATHECAL | Status: DC | PRN
  Administered 2014-07-07: 2 mL via INTRATHECAL

## 2014-07-07 MED ORDER — CLONAZEPAM 1 MG PO TABS
2.0000 mg | ORAL_TABLET | Freq: Every day | ORAL | Status: DC
  Administered 2014-07-07 – 2014-07-08 (×2): 2 mg via ORAL
  Filled 2014-07-07 (×2): qty 2

## 2014-07-07 MED ORDER — MORPHINE SULFATE 2 MG/ML IJ SOLN: 1.0000 mg | INTRAMUSCULAR | Status: DC | PRN

## 2014-07-07 MED ORDER — ONDANSETRON HCL 4 MG/2ML IJ SOLN
INTRAMUSCULAR | Status: DC | PRN
  Administered 2014-07-07: 4 mg via INTRAVENOUS

## 2014-07-07 MED ORDER — ACETAMINOPHEN 650 MG RE SUPP: 650.0000 mg | Freq: Four times a day (QID) | RECTAL | Status: DC | PRN

## 2014-07-07 MED ORDER — BUPIVACAINE LIPOSOME 1.3 % IJ SUSP
20.0000 mL | Freq: Once | INTRAMUSCULAR | Status: DC
  Filled 2014-07-07: qty 20

## 2014-07-07 MED ORDER — ACETAMINOPHEN 325 MG PO TABS
650.0000 mg | ORAL_TABLET | Freq: Four times a day (QID) | ORAL | Status: DC | PRN
  Administered 2014-07-09: 650 mg via ORAL
  Filled 2014-07-07 (×2): qty 2

## 2014-07-07 MED ORDER — DEXAMETHASONE SODIUM PHOSPHATE 10 MG/ML IJ SOLN
INTRAMUSCULAR | Status: DC | PRN
  Administered 2014-07-07: 10 mg via INTRAVENOUS

## 2014-07-07 MED ORDER — LINAGLIPTIN 5 MG PO TABS
5.0000 mg | ORAL_TABLET | Freq: Every day | ORAL | Status: DC
  Administered 2014-07-08 – 2014-07-09 (×2): 5 mg via ORAL
  Filled 2014-07-07 (×2): qty 1

## 2014-07-07 MED ORDER — SODIUM CHLORIDE 0.9 % IV SOLN: INTRAVENOUS | Status: DC

## 2014-07-07 MED ORDER — BUPIVACAINE LIPOSOME 1.3 % IJ SUSP
INTRAMUSCULAR | Status: DC | PRN
  Administered 2014-07-07: 20 mL

## 2014-07-07 MED ORDER — METOCLOPRAMIDE HCL 5 MG/ML IJ SOLN: 5.0000 mg | Freq: Three times a day (TID) | INTRAMUSCULAR | Status: DC | PRN

## 2014-07-07 MED ORDER — PHENYLEPHRINE 40 MCG/ML (10ML) SYRINGE FOR IV PUSH (FOR BLOOD PRESSURE SUPPORT)
PREFILLED_SYRINGE | INTRAVENOUS | Status: AC
  Filled 2014-07-07: qty 10

## 2014-07-07 MED ORDER — ONDANSETRON HCL 4 MG PO TABS: 4.0000 mg | ORAL_TABLET | Freq: Four times a day (QID) | ORAL | Status: DC | PRN

## 2014-07-07 MED ORDER — PROPOFOL INFUSION 10 MG/ML OPTIME
INTRAVENOUS | Status: DC | PRN
  Administered 2014-07-07: 75 ug/kg/min via INTRAVENOUS

## 2014-07-07 MED ORDER — LACTATED RINGERS IV SOLN
INTRAVENOUS | Status: DC
  Administered 2014-07-07: 1000 mL via INTRAVENOUS

## 2014-07-07 MED ORDER — SODIUM CHLORIDE 0.9 % IJ SOLN
INTRAMUSCULAR | Status: AC
  Filled 2014-07-07: qty 50

## 2014-07-07 MED ORDER — ARIPIPRAZOLE 15 MG PO TABS
30.0000 mg | ORAL_TABLET | Freq: Every day | ORAL | Status: DC
  Administered 2014-07-07 – 2014-07-08 (×2): 30 mg via ORAL
  Filled 2014-07-07 (×3): qty 2

## 2014-07-07 MED ORDER — ACETAMINOPHEN 500 MG PO TABS
1000.0000 mg | ORAL_TABLET | Freq: Four times a day (QID) | ORAL | Status: AC
  Administered 2014-07-07 – 2014-07-08 (×4): 1000 mg via ORAL
  Filled 2014-07-07 (×4): qty 2

## 2014-07-07 MED ORDER — BUPIVACAINE HCL 0.25 % IJ SOLN
INTRAMUSCULAR | Status: DC | PRN
  Administered 2014-07-07: 20 mL

## 2014-07-07 MED ORDER — LACTATED RINGERS IV SOLN
INTRAVENOUS | Status: DC | PRN
  Administered 2014-07-07 (×2): via INTRAVENOUS

## 2014-07-07 MED ORDER — METHOCARBAMOL 1000 MG/10ML IJ SOLN
500.0000 mg | Freq: Four times a day (QID) | INTRAVENOUS | Status: DC | PRN
  Administered 2014-07-07: 500 mg via INTRAVENOUS
  Filled 2014-07-07 (×2): qty 5

## 2014-07-07 MED ORDER — METFORMIN HCL 500 MG PO TABS
1000.0000 mg | ORAL_TABLET | Freq: Two times a day (BID) | ORAL | Status: DC
  Administered 2014-07-07 – 2014-07-08 (×2): 1000 mg via ORAL
  Filled 2014-07-07 (×4): qty 2

## 2014-07-07 MED ORDER — LORATADINE 10 MG PO TABS
10.0000 mg | ORAL_TABLET | Freq: Every day | ORAL | Status: DC
  Filled 2014-07-07 (×3): qty 1

## 2014-07-07 MED ORDER — FENTANYL CITRATE 0.05 MG/ML IJ SOLN
INTRAMUSCULAR | Status: DC | PRN
  Administered 2014-07-07: 100 ug via INTRAVENOUS

## 2014-07-07 MED ORDER — RAMIPRIL 5 MG PO CAPS
5.0000 mg | ORAL_CAPSULE | Freq: Every day | ORAL | Status: DC
  Administered 2014-07-08: 5 mg via ORAL
  Filled 2014-07-07 (×3): qty 1

## 2014-07-07 MED ORDER — CHLORHEXIDINE GLUCONATE 4 % EX LIQD: 60.0000 mL | Freq: Once | CUTANEOUS | Status: DC

## 2014-07-07 MED ORDER — MENTHOL 3 MG MT LOZG
1.0000 | LOZENGE | OROMUCOSAL | Status: DC | PRN
  Filled 2014-07-07: qty 9

## 2014-07-07 MED ORDER — KETOROLAC TROMETHAMINE 15 MG/ML IJ SOLN
7.5000 mg | Freq: Four times a day (QID) | INTRAMUSCULAR | Status: AC | PRN
  Administered 2014-07-07: 7.5 mg via INTRAVENOUS

## 2014-07-07 MED ORDER — KETOROLAC TROMETHAMINE 15 MG/ML IJ SOLN
INTRAMUSCULAR | Status: AC
  Filled 2014-07-07: qty 1

## 2014-07-07 MED ORDER — DEXAMETHASONE SODIUM PHOSPHATE 10 MG/ML IJ SOLN
10.0000 mg | Freq: Once | INTRAMUSCULAR | Status: DC
  Filled 2014-07-07: qty 1

## 2014-07-07 MED ORDER — INSULIN ASPART 100 UNIT/ML ~~LOC~~ SOLN
0.0000 [IU] | Freq: Three times a day (TID) | SUBCUTANEOUS | Status: DC
  Administered 2014-07-07 – 2014-07-08 (×4): 3 [IU] via SUBCUTANEOUS
  Administered 2014-07-09 (×2): 5 [IU] via SUBCUTANEOUS

## 2014-07-07 MED ORDER — SODIUM CHLORIDE 0.9 % IV SOLN
INTRAVENOUS | Status: DC
  Administered 2014-07-07: 17:00:00 via INTRAVENOUS

## 2014-07-07 MED ORDER — SODIUM CHLORIDE 0.9 % IR SOLN
Status: DC | PRN
  Administered 2014-07-07: 1000 mL

## 2014-07-07 MED ORDER — DEXAMETHASONE SODIUM PHOSPHATE 10 MG/ML IJ SOLN: 10.0000 mg | Freq: Once | INTRAMUSCULAR | Status: DC

## 2014-07-07 MED ORDER — CEFAZOLIN SODIUM-DEXTROSE 2-3 GM-% IV SOLR
2.0000 g | INTRAVENOUS | Status: AC
  Administered 2014-07-07: 2 g via INTRAVENOUS

## 2014-07-07 MED ORDER — OXYCODONE HCL 5 MG PO TABS
5.0000 mg | ORAL_TABLET | ORAL | Status: DC | PRN
  Administered 2014-07-07: 10 mg via ORAL
  Administered 2014-07-07: 5 mg via ORAL
  Administered 2014-07-08: 10 mg via ORAL
  Administered 2014-07-08 – 2014-07-09 (×4): 5 mg via ORAL
  Administered 2014-07-09: 10 mg via ORAL
  Filled 2014-07-07 (×2): qty 1
  Filled 2014-07-07: qty 2
  Filled 2014-07-07 (×2): qty 1
  Filled 2014-07-07 (×3): qty 2

## 2014-07-07 SURGICAL SUPPLY — 72 items
BAG DECANTER FOR FLEXI CONT (MISCELLANEOUS) ×1 IMPLANT
BAG SPEC THK2 15X12 ZIP CLS (MISCELLANEOUS) ×1
BAG ZIPLOCK 12X15 (MISCELLANEOUS) ×3 IMPLANT
BANDAGE ELASTIC 6 VELCRO ST LF (GAUZE/BANDAGES/DRESSINGS) ×3 IMPLANT
BANDAGE ESMARK 6X9 LF (GAUZE/BANDAGES/DRESSINGS) ×1 IMPLANT
BLADE SAG 18X100X1.27 (BLADE) ×3 IMPLANT
BLADE SAW SGTL 11.0X1.19X90.0M (BLADE) ×3 IMPLANT
BNDG CMPR 9X6 STRL LF SNTH (GAUZE/BANDAGES/DRESSINGS) ×1
BNDG ESMARK 6X9 LF (GAUZE/BANDAGES/DRESSINGS) ×3
BOWL SMART MIX CTS (DISPOSABLE) ×3 IMPLANT
CAPT KNEE TOTAL 3 ATTUNE ×2 IMPLANT
CATH FOLEY LATEX FREE 16FR (CATHETERS) ×2 IMPLANT
CEMENT HV SMART SET (Cement) ×6 IMPLANT
CLOSURE WOUND 1/2 X4 (GAUZE/BANDAGES/DRESSINGS) ×1
CUFF TOURN SGL QUICK 34 (TOURNIQUET CUFF) ×3
CUFF TRNQT CYL 34X4X40X1 (TOURNIQUET CUFF) ×1 IMPLANT
DECANTER SPIKE VIAL GLASS SM (MISCELLANEOUS) ×3 IMPLANT
DRAPE EXTREMITY T 121X128X90 (DRAPE) ×3 IMPLANT
DRAPE POUCH INSTRU U-SHP 10X18 (DRAPES) ×3 IMPLANT
DRAPE U-SHAPE 47X51 STRL (DRAPES) ×3 IMPLANT
DRSG ADAPTIC 3X8 NADH LF (GAUZE/BANDAGES/DRESSINGS) ×3 IMPLANT
DRSG PAD ABDOMINAL 8X10 ST (GAUZE/BANDAGES/DRESSINGS) ×3 IMPLANT
DURAPREP 26ML APPLICATOR (WOUND CARE) ×3 IMPLANT
ELECT REM PT RETURN 9FT ADLT (ELECTROSURGICAL) ×3
ELECTRODE REM PT RTRN 9FT ADLT (ELECTROSURGICAL) ×1 IMPLANT
EVACUATOR 1/8 PVC DRAIN (DRAIN) ×3 IMPLANT
FACESHIELD WRAPAROUND (MASK) ×12 IMPLANT
FACESHIELD WRAPAROUND OR TEAM (MASK) ×5 IMPLANT
GAUZE SPONGE 4X4 12PLY STRL (GAUZE/BANDAGES/DRESSINGS) ×3 IMPLANT
GLOVE BIO SURGEON STRL SZ7.5 (GLOVE) IMPLANT
GLOVE BIO SURGEON STRL SZ8 (GLOVE) ×1 IMPLANT
GLOVE BIOGEL PI IND STRL 6.5 (GLOVE) IMPLANT
GLOVE BIOGEL PI IND STRL 7.0 (GLOVE) IMPLANT
GLOVE BIOGEL PI IND STRL 7.5 (GLOVE) IMPLANT
GLOVE BIOGEL PI IND STRL 8 (GLOVE) ×1 IMPLANT
GLOVE BIOGEL PI INDICATOR 6.5 (GLOVE) ×2
GLOVE BIOGEL PI INDICATOR 7.0 (GLOVE) ×2
GLOVE BIOGEL PI INDICATOR 7.5 (GLOVE) ×2
GLOVE BIOGEL PI INDICATOR 8 (GLOVE) ×2
GLOVE SURG SS PI 6.5 STRL IVOR (GLOVE) ×2 IMPLANT
GLOVE SURG SS PI 7.5 STRL IVOR (GLOVE) ×2 IMPLANT
GLOVE SURG SS PI 8.0 STRL IVOR (GLOVE) ×4 IMPLANT
GOWN STRL REIN 2XL XLG LVL4 (GOWN DISPOSABLE) ×2 IMPLANT
GOWN STRL REUS W/TWL LRG LVL3 (GOWN DISPOSABLE) ×5 IMPLANT
GOWN STRL REUS W/TWL XL LVL3 (GOWN DISPOSABLE) ×2 IMPLANT
HANDPIECE INTERPULSE COAX TIP (DISPOSABLE) ×3
IMMOBILIZER KNEE 20 (SOFTGOODS) ×3
IMMOBILIZER KNEE 20 THIGH 36 (SOFTGOODS) ×1 IMPLANT
KIT BASIN OR (CUSTOM PROCEDURE TRAY) ×3 IMPLANT
MANIFOLD NEPTUNE II (INSTRUMENTS) ×3 IMPLANT
NDL SAFETY ECLIPSE 18X1.5 (NEEDLE) ×2 IMPLANT
NEEDLE HYPO 18GX1.5 SHARP (NEEDLE) ×6
NS IRRIG 1000ML POUR BTL (IV SOLUTION) ×3 IMPLANT
PACK TOTAL JOINT (CUSTOM PROCEDURE TRAY) ×3 IMPLANT
PADDING CAST COTTON 6X4 STRL (CAST SUPPLIES) ×9 IMPLANT
PEN SKIN MARKING BROAD (MISCELLANEOUS) ×3 IMPLANT
POSITIONER SURGICAL ARM (MISCELLANEOUS) ×3 IMPLANT
SET HNDPC FAN SPRY TIP SCT (DISPOSABLE) ×1 IMPLANT
STRIP CLOSURE SKIN 1/2X4 (GAUZE/BANDAGES/DRESSINGS) ×3 IMPLANT
SUCTION FRAZIER 12FR DISP (SUCTIONS) ×3 IMPLANT
SUT MNCRL AB 4-0 PS2 18 (SUTURE) ×3 IMPLANT
SUT VIC AB 2-0 CT1 27 (SUTURE) ×9
SUT VIC AB 2-0 CT1 TAPERPNT 27 (SUTURE) ×3 IMPLANT
SUT VLOC 180 0 24IN GS25 (SUTURE) ×3 IMPLANT
SYR 20CC LL (SYRINGE) ×3 IMPLANT
SYR 50ML LL SCALE MARK (SYRINGE) ×3 IMPLANT
TOWEL OR 17X26 10 PK STRL BLUE (TOWEL DISPOSABLE) ×3 IMPLANT
TOWEL OR NON WOVEN STRL DISP B (DISPOSABLE) IMPLANT
TRAY FOLEY CATH 14FRSI W/METER (CATHETERS) ×1 IMPLANT
WATER STERILE IRR 1500ML POUR (IV SOLUTION) ×3 IMPLANT
WRAP KNEE MAXI GEL POST OP (GAUZE/BANDAGES/DRESSINGS) ×3 IMPLANT
YANKAUER SUCT BULB TIP 10FT TU (MISCELLANEOUS) ×3 IMPLANT

## 2014-07-07 NOTE — Interval H&P Note (Signed)
History and Physical Interval Note:  07/07/2014 9:30 AM  [name]  has presented today for surgery, with the diagnosis of OA RIGHT KNEE  The various methods of treatment have been discussed with the patient and family. After consideration of risks, benefits and other options for treatment, the patient has consented to  Procedure(s): RIGHT TOTAL KNEE ARTHROPLASTY (Right) as a surgical intervention .  The patient's history has been reviewed, patient examined, no change in status, stable for surgery.  I have reviewed the patient's chart and labs.  Questions were answered to the patient's satisfaction.     [name]

## 2014-07-07 NOTE — Op Note (Signed)
Pre-operative diagnosis- Osteoarthritis  Right knee(s)  Post-operative diagnosis- Osteoarthritis Right knee(s)  Procedure-  Right  Total Knee Arthroplasty  Surgeon- Dione Plover. Tanija Germani, MD  Assistant- Ardeen Jourdain, PA-C   Anesthesia-  Spinal  EBL-* No blood loss amount entered *   Drains Hemovac  Tourniquet time-  Total Tourniquet Time Documented: Thigh (Right) - 30 minutes Total: Thigh (Right) - 30 minutes     Complications- None  Condition-PACU - hemodynamically stable.   Brief Clinical Note  [name] is a 69 y.o. year old female with end stage OA of her right knee with progressively worsening pain and dysfunction. She has constant pain, with activity and at rest and significant functional deficits with difficulties even with ADLs. She has had extensive non-op management including analgesics, injections of cortisone, and home exercise program, but remains in significant pain with significant dysfunction.Radiographs show bone on bone arthritis lateral and patellofemoral. She presents now for right Total Knee Arthroplasty.    Procedure in detail---   The patient is brought into the operating room and positioned supine on the operating table. After successful administration of  Spinal,   a tourniquet is placed high on the  Right thigh(s) and the lower extremity is prepped and draped in the usual sterile fashion. Time out is performed by the operating team and then the  Right lower extremity is wrapped in Esmarch, knee flexed and the tourniquet inflated to 300 mmHg.       A midline incision is made with a ten blade through the subcutaneous tissue to the level of the extensor mechanism. A fresh blade is used to make a medial parapatellar arthrotomy. Soft tissue over the proximal medial tibia is subperiosteally elevated to the joint line with a knife and into the semimembranosus bursa with a Cobb elevator. Soft tissue over the proximal lateral tibia is elevated with attention being  paid to avoiding the patellar tendon on the tibial tubercle. The patella is everted, knee flexed 90 degrees and the ACL and PCL are removed. Findings are sevee patellofemoral bone on bone changes with bony erosion of the patella and exposed bone medial and lateral compartments.        The drill is used to create a starting hole in the distal femur and the canal is thoroughly irrigated with sterile saline to remove the fatty contents. The 5 degree Right  valgus alignment guide is placed into the femoral canal and the distal femoral cutting block is pinned to remove 9 mm off the distal femur. Resection is made with an oscillating saw.      The tibia is subluxed forward and the menisci are removed. The extramedullary alignment guide is placed referencing proximally at the medial aspect of the tibial tubercle and distally along the second metatarsal axis and tibial crest. The block is pinned to remove 32mm off the more deficient medial  side. Resection is made with an oscillating saw. Size 5is the most appropriate size for the tibia and the proximal tibia is prepared with the modular drill and keel punch for that size.      The femoral sizing guide is placed and size 5 is most appropriate. Rotation is marked off the epicondylar axis and confirmed by creating a rectangular flexion gap at 90 degrees. The size 5 cutting block is pinned in this rotation and the anterior, posterior and chamfer cuts are made with the oscillating saw. The intercondylar block is then placed and that cut is made.  Trial size 5 tibial component, trial size 5 posterior stabilized femur and a 6  mm posterior stabilized rotating platform insert trial is placed. Full extension is achieved with excellent varus/valgus and anterior/posterior balance throughout full range of motion. The patella is everted and thickness measured to be 22  mm. Free hand resection is taken to 12 mm, a 38 template is placed, lug holes are drilled, trial patella is  placed, and it tracks normally. Osteophytes are removed off the posterior femur with the trial in place. All trials are removed and the cut bone surfaces prepared with pulsatile lavage. Cement is mixed and once ready for implantation, the size 5 tibial implant, size  5 posterior stabilized femoral component, and the size 38 patella are cemented in place and the patella is held with the clamp. The trial insert is placed and the knee held in full extension. The Exparel (20 ml mixed with 30 ml saline) and .25% Bupivicaine, are injected into the extensor mechanism, posterior capsule, medial and lateral gutters and subcutaneous tissues.  All extruded cement is removed and once the cement is hard the permanent 6 mm posterior stabilized rotating platform insert is placed into the tibial tray.      The wound is copiously irrigated with saline solution and the extensor mechanism closed over a hemovac drain with #1 V-loc suture. The tourniquet is released for a total tourniquet time of 30  minutes. Flexion against gravity is 140 degrees and the patella tracks normally. Subcutaneous tissue is closed with 2.0 vicryl and subcuticular with running 4.0 Monocryl. The incision is cleaned and dried and steri-strips and a bulky sterile dressing are applied. The limb is placed into a knee immobilizer and the patient is awakened and transported to recovery in stable condition.      Please note that a surgical assistant was a medical necessity for this procedure in order to perform it in a safe and expeditious manner. Surgical assistant was necessary to retract the ligaments and vital neurovascular structures to prevent injury to them and also necessary for proper positioning of the limb to allow for anatomic placement of the prosthesis.   Dione Plover Harim Bi, MD    07/07/2014, 12:17 PM

## 2014-07-07 NOTE — Anesthesia Postprocedure Evaluation (Signed)
  Anesthesia Post-op Note  Patient: [name]  Procedure(s) Performed: Procedure(s): RIGHT TOTAL KNEE ARTHROPLASTY (Right)  Patient Location: PACU  Anesthesia Type:Spinal  Level of Consciousness: awake and alert   Airway and Oxygen Therapy: Patient Spontanous Breathing  Post-op Pain: none  Post-op Assessment: Post-op Vital signs reviewed  Post-op Vital Signs: Reviewed  Last Vitals:  Filed Vitals:   07/07/14 1528  BP: 121/64  Pulse: 72  Temp: 36.4 C  Resp: 16    Complications: No apparent anesthesia complications

## 2014-07-07 NOTE — Transfer of Care (Signed)
Immediate Anesthesia Transfer of Care Note  Patient: [name]  Procedure(s) Performed: Procedure(s) (LRB): RIGHT TOTAL KNEE ARTHROPLASTY (Right)  Patient Location: PACU  Anesthesia Type: Spinal  Level of Consciousness: sedated, patient cooperative and responds to stimulation  Airway & Oxygen Therapy: Patient Spontanous Breathing and Patient connected to face mask oxgen  Post-op Assessment: Report given to PACU RN and Post -op Vital signs reviewed and stable  Post vital signs: Reviewed and stable  Complications: No apparent anesthesia complications

## 2014-07-07 NOTE — Anesthesia Preprocedure Evaluation (Addendum)
Anesthesia Evaluation  Patient identified by MRN, date of birth, ID band Patient awake    Reviewed: Allergy & Precautions, NPO status , Patient's Chart, lab work & pertinent test results  Airway Mallampati: II  TM Distance: >3 FB Neck ROM: Full    Dental  (+) Teeth Intact, Dental Advisory Given   Pulmonary neg pulmonary ROS,  breath sounds clear to auscultation        Cardiovascular hypertension, Pt. on medications Rhythm:Regular Rate:Normal     Neuro/Psych Anxiety Depression Bipolar Disorder negative neurological ROS     GI/Hepatic negative GI ROS, Neg liver ROS,   Endo/Other  diabetes, Type 2, Insulin Dependent  Renal/GU negative Renal ROS     Musculoskeletal  (+) Arthritis -,   Abdominal   Peds  Hematology negative hematology ROS (+)   Anesthesia Other Findings   Reproductive/Obstetrics                            Anesthesia Physical Anesthesia Plan  ASA: II  Anesthesia Plan: Spinal   Post-op Pain Management:    Induction: Intravenous  Airway Management Planned: Natural Airway and Simple Face Mask  Additional Equipment:   Intra-op Plan:   Post-operative Plan:   Informed Consent: I have reviewed the patients History and Physical, chart, labs and discussed the procedure including the risks, benefits and alternatives for the proposed anesthesia with the patient or authorized representative who has indicated his/her understanding and acceptance.     Plan Discussed with: CRNA  Anesthesia Plan Comments:        Anesthesia Quick Evaluation

## 2014-07-07 NOTE — Anesthesia Procedure Notes (Addendum)
Spinal Patient location during procedure: OR Start time: 07/07/2014 11:25 AM Staffing Anesthesiologist: Suzette Battiest E Resident/CRNA: KEY, KRISTOPHER Performed by: resident/CRNA  Preanesthetic Checklist Completed: patient identified, site marked, surgical consent, pre-op evaluation, timeout performed, IV checked, risks and benefits discussed and monitors and equipment checked Spinal Block Patient position: sitting Prep: ChloraPrep Patient monitoring: heart rate, cardiac monitor, continuous pulse ox and blood pressure Location: L3-4 Injection technique: single-shot Needle Needle type: Spinocan  Needle gauge: 24 G Needle length: 9 cm Needle insertion depth: 6.5 cm Assessment Sensory level: T10 Additional Notes Easy pass, first attempt, skin infiltration with Lidocaine 1.5cc.  +CSF start and end of injection, -heme, -para.

## 2014-07-07 NOTE — Progress Notes (Signed)
Utilization review completed.  

## 2014-07-08 LAB — BASIC METABOLIC PANEL
Anion gap: 7 (ref 5–15)
BUN: 15 mg/dL (ref 6–23)
CHLORIDE: 103 mmol/L (ref 96–112)
CO2: 26 mmol/L (ref 19–32)
CREATININE: 0.62 mg/dL (ref 0.50–1.10)
Calcium: 8.7 mg/dL (ref 8.4–10.5)
GFR calc non Af Amer: 90 mL/min — ABNORMAL LOW (ref >90)
GLUCOSE: 193 mg/dL — AB (ref 70–99)
Potassium: 4.1 mmol/L (ref 3.5–5.1)
Sodium: 136 mmol/L (ref 135–145)

## 2014-07-08 LAB — CBC
HCT: 29.8 % — ABNORMAL LOW (ref 36.0–46.0)
Hemoglobin: 9.9 g/dL — ABNORMAL LOW (ref 12.0–15.0)
MCH: 28.1 pg (ref 26.0–34.0)
MCHC: 33.2 g/dL (ref 30.0–36.0)
MCV: 84.7 fL (ref 78.0–100.0)
Platelets: 293 10*3/uL (ref 150–400)
RBC: 3.52 MIL/uL — AB (ref 3.87–5.11)
RDW: 13.9 % (ref 11.5–15.5)
WBC: 6.4 10*3/uL (ref 4.0–10.5)

## 2014-07-08 LAB — GLUCOSE, CAPILLARY
Glucose-Capillary: 218 mg/dL — ABNORMAL HIGH (ref 70–99)
Glucose-Capillary: 181 mg/dL — ABNORMAL HIGH (ref 70–99)
Glucose-Capillary: 250 mg/dL — ABNORMAL HIGH (ref 70–99)

## 2014-07-08 MED ORDER — SODIUM CHLORIDE 0.9 % IV BOLUS (SEPSIS)
250.0000 mL | Freq: Once | INTRAVENOUS | Status: AC
  Administered 2014-07-08: 250 mL via INTRAVENOUS

## 2014-07-08 NOTE — Progress Notes (Signed)
Physical Therapy Treatment Patient Details Name: [name] MRN: [id number] DOB: 08-31-1945 Today's Date: 07/08/2014    History of Present Illness 69 yo female s/p RTKA 07/07/14.     PT Comments    Progressing with mobility. Slow but steady gait. Plan is for d/c on tomorrow. Will plan to practice steps with husband present.   Follow Up Recommendations  Home health PT;Supervision/Assistance - 24 hour     Equipment Recommendations  None recommended by PT    Recommendations for Other Services OT consult     Precautions / Restrictions Precautions Precautions: Knee;Fall Required Braces or Orthoses: Knee Immobilizer - Right Knee Immobilizer - Right: Discontinue once straight leg raise with < 10 degree lag Restrictions Weight Bearing Restrictions: No RLE Weight Bearing: Weight bearing as tolerated    Mobility  Bed Mobility Overal bed mobility: Needs Assistance Bed Mobility: Sit to Supine     Supine to sit: Min assist Sit to supine: Min assist   General bed mobility comments: small amount of assist for R LE onto bed  Transfers Overall transfer level: Needs assistance Equipment used: Rolling walker (2 wheeled) Transfers: Sit to/from Stand Sit to Stand: Min assist         General transfer comment: assist to rise, stabilize, control descent. VCs safety, technique, hand placement  Ambulation/Gait Ambulation/Gait assistance: Min assist Ambulation Distance (Feet): 65 Feet Assistive device: Rolling walker (2 wheeled) Gait Pattern/deviations: Step-to pattern;Decreased stride length;Antalgic;Trunk flexed     General Gait Details: assist to stabilize. VCs safety, sequence. slow gait speed.    Stairs            Wheelchair Mobility    Modified Rankin (Stroke Patients Only)       Balance                                    Cognition Arousal/Alertness: Awake/alert Behavior During Therapy: WFL for tasks assessed/performed Overall Cognitive  Status: Within Functional Limits for tasks assessed                      Exercises    General Comments        Pertinent Vitals/Pain Pain Assessment: 0-10 Pain Score: 5  Pain Location: R knee Pain Descriptors / Indicators: Aching;Sore;Throbbing Pain Intervention(s): Monitored during session;Ice applied;Repositioned    Home Living Family/patient expects to be discharged to:: Private residence Living Arrangements: Spouse/significant other   Type of Home: House Home Access: Stairs to enter   Home Layout: Two level;Able to live on main level with bedroom/bathroom Home Equipment: Gilford Rile - 2 wheels;Crutches;Cane - single point Additional Comments: borrowed walker    Prior Function Level of Independence: Independent          PT Goals (current goals can now be found in the care plan section) Acute Rehab PT Goals Patient Stated Goal: regain independence PT Goal Formulation: With patient Time For Goal Achievement: 07/15/14 Potential to Achieve Goals: Good Progress towards PT goals: Progressing toward goals    Frequency  7X/week    PT Plan Current plan remains appropriate    Co-evaluation             End of Session Equipment Utilized During Treatment: Gait belt;Right knee immobilizer Activity Tolerance: Patient tolerated treatment well Patient left: in bed;with call bell/phone within reach;with family/visitor present     Time: 1093-2355 PT Time Calculation (min) (ACUTE ONLY): 18 min  Charges:  $  Gait Training: 8-22 mins                    G Codes:      Weston Anna, MPT Pager: [phone]

## 2014-07-08 NOTE — Progress Notes (Signed)
   Subjective: 1 Day Post-Op Procedure(s) (LRB): RIGHT TOTAL KNEE ARTHROPLASTY (Right) Patient reports pain as mild.   Patient seen in rounds with Dr. Wynelle Link.  Able to get some rest last night. Patient is well, but has had some minor complaints of pain in the knee, requiring pain medications We will start therapy today.  Plan is to go Home after hospital stay.  Objective: Vital signs in last 24 hours: Temp:  [97 F (36.1 C)-98.1 F (36.7 C)] 97.8 F (36.6 C) (03/01 0532) Pulse Rate:  [55-91] 64 (03/01 0532) Resp:  [14-19] 16 (03/01 0532) BP: (97-127)/(47-64) 105/47 mmHg (03/01 0532) SpO2:  [95 %-100 %] 99 % (03/01 0532) Weight:  [79.153 kg (174 lb 8 oz)] 79.153 kg (174 lb 8 oz) (02/29 0853)  Intake/Output from previous day:  Intake/Output Summary (Last 24 hours) at 07/08/14 0840 Last data filed at 07/08/14 0631  Gross per 24 hour  Intake   2295 ml  Output   3540 ml  Net  -1245 ml    Intake/Output this shift: UOP 1000  Labs:  Recent Labs  07/08/14 0440  HGB 9.9*    Recent Labs  07/08/14 0440  WBC 6.4  RBC 3.52*  HCT 29.8*  PLT 293    Recent Labs  07/08/14 0440  NA 136  K 4.1  CL 103  CO2 26  BUN 15  CREATININE 0.62  GLUCOSE 193*  CALCIUM 8.7   No results for input(s): LABPT, INR in the last 72 hours.  EXAM General - Patient is Alert, Appropriate and Oriented Extremity - Neurovascular intact Sensation intact distally Dorsiflexion/Plantar flexion intact Dressing - dressing C/D/I Motor Function - intact, moving foot and toes well on exam.  Hemovac pulled without difficulty.  Past Medical History  Diagnosis Date  . Hypertension   . Hyperlipidemia   . Arthritis   . History of duodenal ulcer 1989  . Stress incontinence   . Diabetes mellitus without complication   . Anxiety   . Depression   . Bipolar 1 disorder   . History of breast cancer 1995    lumpectomy and radiation left breast  . Cancer     hx breast cancer     Assessment/Plan: 1 Day Post-Op Procedure(s) (LRB): RIGHT TOTAL KNEE ARTHROPLASTY (Right) Principal Problem:   OA (osteoarthritis) of knee  Estimated body mass index is 29.25 kg/(m^2) as calculated from the following:   Height as of this encounter: 5' 4.75" (1.645 m).   Weight as of this encounter: 79.153 kg (174 lb 8 oz). Up with therapy Plan for discharge tomorrow Discharge home with home health  DVT Prophylaxis - Xarelto Weight-Bearing as tolerated to right leg D/C O2 and Pulse OX and try on Room Air Soft pressures last night.  Fluids this morning and recheck BP after bolus.  Arlee Muslim, PA-C Orthopaedic Surgery 07/08/2014, 8:40 AM

## 2014-07-08 NOTE — Evaluation (Signed)
Physical Therapy Evaluation Patient Details Name: ANNALEESE GUIER MRN: [id number] DOB: 10/01/1945 Today's Date: 07/08/2014   History of Present Illness  69 yo female s/p RTKA 07/07/14.   Clinical Impression  On eval, pt required Min assist for mobility-able to ambulate ~65 feet with RW. Pt tolerated activity well. Pain rated 5-6/10 with activity.     Follow Up Recommendations Home health PT;Supervision/Assistance - 24 hour    Equipment Recommendations  None recommended by PT (pt/husband state they will borrow DME)    Recommendations for Other Services OT consult     Precautions / Restrictions Precautions Precautions: Fall;Knee Required Braces or Orthoses: Knee Immobilizer - Right Knee Immobilizer - Right: Discontinue once straight leg raise with < 10 degree lag Restrictions Weight Bearing Restrictions: No RLE Weight Bearing: Weight bearing as tolerated      Mobility  Bed Mobility Overal bed mobility: Needs Assistance Bed Mobility: Supine to Sit     Supine to sit: Min assist     General bed mobility comments: Assist for R LE.   Transfers Overall transfer level: Needs assistance Equipment used: Rolling walker (2 wheeled) Transfers: Sit to/from Stand Sit to Stand: Min assist         General transfer comment: assist to rise, stabilize, control descent. VCs safety, technique, hand placement  Ambulation/Gait Ambulation/Gait assistance: Min assist Ambulation Distance (Feet): 65 Feet Assistive device: Rolling walker (2 wheeled) Gait Pattern/deviations: Step-to pattern;Trunk flexed;Decreased stride length     General Gait Details: assist to stabilize. VCs safety, sequence. slow gait speed.   Stairs            Wheelchair Mobility    Modified Rankin (Stroke Patients Only)       Balance                                             Pertinent Vitals/Pain Pain Assessment: 0-10 Pain Score: 5  Pain Location: R knee Pain Descriptors /  Indicators: Aching;Sore Pain Intervention(s): Monitored during session;Ice applied;Repositioned    Home Living Family/patient expects to be discharged to:: Private residence Living Arrangements: Spouse/significant other   Type of Home: House Home Access: Stairs to enter   [employer] of Steps: 1 Home Layout: Two level;Able to live on main level with bedroom/bathroom Home Equipment: Gilford Rile - 2 wheels;Crutches;Cane - single point Additional Comments: borrowed walker    Prior Function Level of Independence: Independent               Hand Dominance        Extremity/Trunk Assessment   Upper Extremity Assessment: Defer to OT evaluation           Lower Extremity Assessment: RLE deficits/detail RLE Deficits / Details: at least: hip flex 2/5, hip abd/add 2/5, moves ankle well    Cervical / Trunk Assessment: Normal  Communication   Communication: No difficulties  Cognition Arousal/Alertness: Awake/alert Behavior During Therapy: WFL for tasks assessed/performed Overall Cognitive Status: Within Functional Limits for tasks assessed                      General Comments      Exercises Total Joint Exercises Ankle Circles/Pumps: AROM;Both;10 reps;Supine Quad Sets: AROM;Right;10 reps;Supine Heel Slides: AAROM;Right;10 reps;Supine Hip ABduction/ADduction: AAROM;Right;10 reps;Supine Straight Leg Raises: AAROM;Right;10 reps;Supine Goniometric ROM: ~10-55 degrees      Assessment/Plan    PT Assessment Patient  needs continued PT services  PT Diagnosis Difficulty walking;Acute pain   PT Problem List Decreased strength;Decreased range of motion;Decreased activity tolerance;Decreased balance;Decreased mobility;Decreased knowledge of use of DME;Pain;Decreased knowledge of precautions  PT Treatment Interventions Gait training;DME instruction;Stair training;Functional mobility training;Therapeutic activities;Therapeutic exercise;Patient/family  education;Balance training   PT Goals (Current goals can be found in the Care Plan section) Acute Rehab PT Goals Patient Stated Goal: regain independence PT Goal Formulation: With patient Time For Goal Achievement: 07/15/14 Potential to Achieve Goals: Good    Frequency 7X/week   Barriers to discharge        Co-evaluation               End of Session Equipment Utilized During Treatment: Gait belt Activity Tolerance: Patient tolerated treatment well Patient left: in chair;with call bell/phone within reach;with family/visitor present           Time: 7106-2694 PT Time Calculation (min) (ACUTE ONLY): 34 min   Charges:   PT Evaluation $Initial PT Evaluation Tier I: 1 Procedure PT Treatments $Gait Training: 8-22 mins   PT G Codes:        Weston Anna, MPT Pager: [phone]

## 2014-07-09 LAB — BASIC METABOLIC PANEL
Anion gap: 10 (ref 5–15)
BUN: 8 mg/dL (ref 6–23)
CHLORIDE: 99 mmol/L (ref 96–112)
CO2: 23 mmol/L (ref 19–32)
CREATININE: 0.58 mg/dL (ref 0.50–1.10)
Calcium: 8.7 mg/dL (ref 8.4–10.5)
GFR calc Af Amer: >90 mL/min (ref >90)
GFR calc non Af Amer: >90 mL/min (ref >90)
Glucose, Bld: 233 mg/dL — ABNORMAL HIGH (ref 70–99)
Potassium: 4.3 mmol/L (ref 3.5–5.1)
SODIUM: 132 mmol/L — AB (ref 135–145)

## 2014-07-09 LAB — CBC
HCT: 28.9 % — ABNORMAL LOW (ref 36.0–46.0)
Hemoglobin: 9.5 g/dL — ABNORMAL LOW (ref 12.0–15.0)
MCH: 27.7 pg (ref 26.0–34.0)
MCHC: 32.9 g/dL (ref 30.0–36.0)
MCV: 84.3 fL (ref 78.0–100.0)
Platelets: 285 10*3/uL (ref 150–400)
RBC: 3.43 MIL/uL — ABNORMAL LOW (ref 3.87–5.11)
RDW: 13.8 % (ref 11.5–15.5)
WBC: 7.9 10*3/uL (ref 4.0–10.5)

## 2014-07-09 LAB — GLUCOSE, CAPILLARY
Glucose-Capillary: 221 mg/dL — ABNORMAL HIGH (ref 70–99)
Glucose-Capillary: 222 mg/dL — ABNORMAL HIGH (ref 70–99)

## 2014-07-09 MED ORDER — METHOCARBAMOL 500 MG PO TABS
500.0000 mg | ORAL_TABLET | Freq: Four times a day (QID) | ORAL | Status: DC | PRN
Start: 2014-07-09 — End: 2015-07-08

## 2014-07-09 MED ORDER — OXYCODONE HCL 5 MG PO TABS: 5.0000 mg | ORAL_TABLET | ORAL | Status: DC | PRN

## 2014-07-09 MED ORDER — RIVAROXABAN 10 MG PO TABS: 10.0000 mg | ORAL_TABLET | Freq: Every day | ORAL | Status: DC

## 2014-07-09 MED ORDER — TRAMADOL HCL 50 MG PO TABS: 50.0000 mg | ORAL_TABLET | Freq: Four times a day (QID) | ORAL | Status: DC | PRN

## 2014-07-09 NOTE — Progress Notes (Signed)
   Subjective: 2 Days Post-Op Procedure(s) (LRB): RIGHT TOTAL KNEE ARTHROPLASTY (Right) Patient reports pain as mild.   Patient seen in rounds for Dr. Wynelle Link. Little drowsy but just woke up. Patient is well, but has had some minor complaints of pain in the knee, requiring pain medications Patient is ready to go home today.  Objective: Vital signs in last 24 hours: Temp:  [97.7 F (36.5 C)-98.4 F (36.9 C)] 97.7 F (36.5 C) (03/02 0559) Pulse Rate:  [66-91] 90 (03/02 0559) Resp:  [16-19] 19 (03/02 0800) BP: (120-136)/(55-59) 125/55 mmHg (03/02 0559) SpO2:  [95 %-98 %] 95 % (03/02 0559)  Intake/Output from previous day:  Intake/Output Summary (Last 24 hours) at 07/09/14 1019 Last data filed at 07/08/14 2200  Gross per 24 hour  Intake 1435.5 ml  Output    950 ml  Net  485.5 ml    Labs:  Recent Labs  07/08/14 0440 07/09/14 0525  HGB 9.9* 9.5*    Recent Labs  07/08/14 0440 07/09/14 0525  WBC 6.4 7.9  RBC 3.52* 3.43*  HCT 29.8* 28.9*  PLT 293 285    Recent Labs  07/08/14 0440 07/09/14 0525  NA 136 132*  K 4.1 4.3  CL 103 99  CO2 26 23  BUN 15 8  CREATININE 0.62 0.58  GLUCOSE 193* 233*  CALCIUM 8.7 8.7   No results for input(s): LABPT, INR in the last 72 hours.  EXAM: General - Patient is Alert, Appropriate and Oriented Extremity - Neurovascular intact Sensation intact distally Dorsiflexion/Plantar flexion intact Incision - clean, dry, no drainage Motor Function - intact, moving foot and toes well on exam.   Assessment/Plan: 2 Days Post-Op Procedure(s) (LRB): RIGHT TOTAL KNEE ARTHROPLASTY (Right) Procedure(s) (LRB): RIGHT TOTAL KNEE ARTHROPLASTY (Right) Past Medical History  Diagnosis Date  . Hypertension   . Hyperlipidemia   . Arthritis   . History of duodenal ulcer 1989  . Stress incontinence   . Diabetes mellitus without complication   . Anxiety   . Depression   . Bipolar 1 disorder   . History of breast cancer 1995    lumpectomy  and radiation left breast  . Cancer     hx breast cancer   Principal Problem:   OA (osteoarthritis) of knee  Estimated body mass index is 29.25 kg/(m^2) as calculated from the following:   Height as of this encounter: 5' 4.75" (1.645 m).   Weight as of this encounter: 79.153 kg (174 lb 8 oz). Up with therapy Discharge home with home health Diet - Cardiac diet and Diabetic diet Follow up - in 2 weeks Activity - WBAT Disposition - Home Condition Upon Discharge - Good D/C Meds - See DC Summary DVT Prophylaxis - Xarelto  Arlee Muslim, PA-C Orthopaedic Surgery 07/09/2014, 10:19 AM

## 2014-07-09 NOTE — Progress Notes (Signed)
Physical Therapy Treatment Patient Details Name: [name] MRN: [id number] DOB: 1945/11/29 Today's Date: 07/09/2014    History of Present Illness 69 yo female s/p RTKA 07/07/14.     PT Comments    Progressing with mobility. Pt still drowsy, slow to process most likely due to meds. Pt participated well. Practiced exercises, ambulation, stair negotiation. Discussed car transfer. Husband present to observe/participate in session as well. All education completed.   Follow Up Recommendations  Home health PT;Supervision/Assistance - 24 hour     Equipment Recommendations  None recommended by PT    Recommendations for Other Services OT consult     Precautions / Restrictions Precautions Precautions: Knee;Fall Required Braces or Orthoses: Knee Immobilizer - Right Knee Immobilizer - Right: Discontinue once straight leg raise with < 10 degree lag Restrictions Weight Bearing Restrictions: No RLE Weight Bearing: Weight bearing as tolerated    Mobility  Bed Mobility Overal bed mobility: Needs Assistance Bed Mobility: Supine to Sit     Supine to sit: Min assist     General bed mobility comments: small amount of assist for R LE  Transfers Overall transfer level: Needs assistance Equipment used: Rolling walker (2 wheeled) Transfers: Sit to/from Stand Sit to Stand: Min guard         General transfer comment: close guard for safety. VCs safety, technique, hand placement  Ambulation/Gait Ambulation/Gait assistance: Min guard Ambulation Distance (Feet): 70 Feet Assistive device: Rolling walker (2 wheeled) Gait Pattern/deviations: Step-to pattern;Trunk flexed;Decreased stride length;Antalgic     General Gait Details: close guard for safety.  VCs safety, sequence. slow gait speed.    Stairs Stairs: Yes   Stair Management: Forwards;With walker;Step to pattern Number of Stairs: 1 (x2) General stair comments: VCs safety, technique, sequence. Assist to stabilize. Husband  present as well.   Wheelchair Mobility    Modified Rankin (Stroke Patients Only)       Balance                                    Cognition Arousal/Alertness: Awake/alert Behavior During Therapy: WFL for tasks assessed/performed Overall Cognitive Status: Within Functional Limits for tasks assessed                      Exercises Total Joint Exercises Ankle Circles/Pumps: AROM;Both;10 reps;Supine Quad Sets: AROM;Right;10 reps;Supine Heel Slides: AAROM;Right;10 reps;Supine Hip ABduction/ADduction: AAROM;Right;10 reps;Supine Straight Leg Raises: AAROM;Right;10 reps;Supine Goniometric ROM: ~10-50 degrees -increased pain on today    General Comments        Pertinent Vitals/Pain Pain Assessment: 0-10 Pain Score: 5  Pain Location: R knee Pain Descriptors / Indicators: Aching;Sore;Throbbing Pain Intervention(s): Monitored during session;Ice applied;Repositioned    Home Living                      Prior Function            PT Goals (current goals can now be found in the care plan section) Progress towards PT goals: Progressing toward goals    Frequency  7X/week    PT Plan Current plan remains appropriate    Co-evaluation             End of Session Equipment Utilized During Treatment: Gait belt;Right knee immobilizer Activity Tolerance: Patient tolerated treatment well Patient left: in chair;with call bell/phone within reach;with family/visitor present     Time: 3810-1751 PT Time Calculation (min) (ACUTE  ONLY): 57 min  Charges:  $Gait Training: 38-52 mins $Therapeutic Exercise: 8-22 mins                    G Codes:      Weston Anna, MPT Pager: [phone]

## 2014-07-09 NOTE — Progress Notes (Signed)
Occupational Therapy Treatment Patient Details Name: JAMILEX BOHNSACK MRN: [id number] DOB: 13-Aug-1945 Today's Date: 07/09/2014    History of present illness 69 yo female s/p RTKA 07/07/14.    OT comments  Ready for DC from OT standpoint  Follow Up Recommendations  No OT follow up    Equipment Recommendations       Recommendations for Other Services      Precautions / Restrictions Precautions Precautions: Knee;Fall Required Braces or Orthoses: Knee Immobilizer - Right Knee Immobilizer - Right: Discontinue once straight leg raise with < 10 degree lag Restrictions Weight Bearing Restrictions: No RLE Weight Bearing: Weight bearing as tolerated       Mobility Bed Mobility Overal bed mobility: Needs Assistance Bed Mobility: Supine to Sit     Supine to sit: Min assist     General bed mobility comments: pt in chair  Transfers Overall transfer level: Needs assistance Equipment used: Rolling walker (2 wheeled) Transfers: Sit to/from Stand Sit to Stand: Supervision         General transfer comment: close guard for safety. VCs safety, technique, hand placement    Balance                                   ADL Overall ADL's : Needs assistance/impaired Eating/Feeding: Set up;Sitting   Grooming: Set up;Standing       Lower Body Bathing: Sit to/from stand;Minimal assistance   Upper Body Dressing : Sitting;Set up   Lower Body Dressing: Sit to/from stand;Minimal assistance   Toilet Transfer: Minimal assistance;Comfort height toilet;Ambulation   Toileting- Clothing Manipulation and Hygiene: Minimal assistance;Sit to/from stand         General ADL Comments: increased time and encouragement      Vision                            Cognition   Behavior During Therapy: [facility] for tasks assessed/performed Overall Cognitive Status: Within Functional Limits for tasks assessed                         Exercises Total Joint  Exercises Ankle Circles/Pumps: AROM;Both;10 reps;Supine Quad Sets: AROM;Right;10 reps;Supine Heel Slides: AAROM;Right;10 reps;Supine Hip ABduction/ADduction: AAROM;Right;10 reps;Supine Straight Leg Raises: AAROM;Right;10 reps;Supine Goniometric ROM: ~10-50 degrees -increased pain on today   Shoulder Instructions       General Comments      Pertinent Vitals/ Pain       Pain Assessment: 0-10 Pain Score: 5  Pain Location: R knee Pain Descriptors / Indicators: Aching;Sore;Throbbing Pain Intervention(s): Monitored during session;Ice applied;Repositioned         Frequency Min 2X/week     Progress Toward Goals  OT Goals(current goals can now be found in the care plan section)  Progress towards OT goals: Progressing toward goals     Plan Discharge plan remains appropriate    Co-evaluation                 End of Session Equipment Utilized During Treatment: Rolling walker   Activity Tolerance Patient tolerated treatment well   Patient Left in chair   Nurse Communication Mobility status        Time: 8882-8003 OT Time Calculation (min): 30 min  Charges: OT General Charges $OT Visit: 1 Procedure OT Treatments $Self Care/Home Management : 23-37 mins  Shirle Provencal, Thereasa Parkin 07/09/2014, 12:15 PM

## 2014-07-09 NOTE — Care Management Note (Signed)
    Page 1 of 2   07/09/2014     12:58:02 PM CARE MANAGEMENT NOTE 07/09/2014  Patient:  [name], [name]   Account Number:  [id number]  Date Initiated:  07/09/2014  Documentation initiated by:  [facility]  Subjective/Objective Assessment:   adm: RIGHT TOTAL KNEE ARTHROPLASTY (Right)     Action/Plan:   discharge planning   Anticipated DC Date:  07/09/2014   Anticipated DC Plan:  [place]  CM consult      [facility] Choice  HOME HEALTH   Choice offered to / List presented to:  C-1 Patient   DME arranged  3-N-1      DME agency  [place] arranged  [facility]   Status of service:  Completed, signed off Medicare Important Message given?   (If response is "NO", the following Medicare IM given date fields will be blank) Date Medicare IM given:   Medicare IM given by:   Date Additional Medicare IM given:   Additional Medicare IM given by:    Discharge Disposition:  [place]  Per UR Regulation:    If discussed at Long Length of Stay Meetings, dates discussed:    Comments:  07/09/14 09:45 CM met with pt in room to offer choice of home health agency.  Pt chooses [facility] to render HHPT.  Address and contact information verified by pt.  CM called [facility] DME rep, Lecretia to please deliver the 3n1 to room prior to discharge.  Referral emailed to [employer], Tim. No other CM needs were communicated.  Mariane Masters, BSn, Cm [phone].

## 2014-07-09 NOTE — Discharge Summary (Signed)
Physician Discharge Summary   Patient ID: JASALYN Cooke MRN: [id number] DOB/AGE: [birth year] 69 y.o.  Admit date: 07/07/2014 Discharge date: 07/09/2014  Primary Diagnosis:  Osteoarthritis Right knee(s) Admission Diagnoses:  Past Medical History  Diagnosis Date  . Hypertension   . Hyperlipidemia   . Arthritis   . History of duodenal ulcer 1989  . Stress incontinence   . Diabetes mellitus without complication   . Anxiety   . Depression   . Bipolar 1 disorder   . History of breast cancer 1995    lumpectomy and radiation left breast  . Cancer     hx breast cancer   Discharge Diagnoses:   Principal Problem:   OA (osteoarthritis) of knee  Estimated body mass index is 29.25 kg/(m^2) as calculated from the following:   Height as of this encounter: 5' 4.75" (1.645 m).   Weight as of this encounter: 79.153 kg (174 lb 8 oz).  Procedure:  Procedure(s) (LRB): RIGHT TOTAL KNEE ARTHROPLASTY (Right)   Consults: None  HPI: [name] is a 69 y.o. year old female with end stage OA of her right knee with progressively worsening pain and dysfunction. She has constant pain, with activity and at rest and significant functional deficits with difficulties even with ADLs. She has had extensive non-op management including analgesics, injections of cortisone, and home exercise program, but remains in significant pain with significant dysfunction.Radiographs show bone on bone arthritis lateral and patellofemoral. She presents now for right Total Knee Arthroplasty.  Laboratory Data: Admission on 07/07/2014, Discharged on 07/09/2014  Component Date Value Ref Range Status  . Glucose-Capillary 07/07/2014 111* 70 - 99 mg/dL Final  . Comment 1 07/07/2014 Notify RN   Final  . Glucose-Capillary 07/07/2014 108* 70 - 99 mg/dL Final  . Comment 1 07/07/2014 Document in Chart   Final  . Glucose-Capillary 07/07/2014 107* 70 - 99 mg/dL Final  . Glucose-Capillary 07/07/2014 161* 70 - 99 mg/dL Final  .  WBC 07/08/2014 6.4  4.0 - 10.5 K/uL Final  . RBC 07/08/2014 3.52* 3.87 - 5.11 MIL/uL Final  . Hemoglobin 07/08/2014 9.9* 12.0 - 15.0 g/dL Final  . HCT 07/08/2014 29.8* 36.0 - 46.0 % Final  . MCV 07/08/2014 84.7  78.0 - 100.0 fL Final  . MCH 07/08/2014 28.1  26.0 - 34.0 pg Final  . MCHC 07/08/2014 33.2  30.0 - 36.0 g/dL Final  . RDW 07/08/2014 13.9  11.5 - 15.5 % Final  . Platelets 07/08/2014 293  150 - 400 K/uL Final  . Sodium 07/08/2014 136  135 - 145 mmol/L Final  . Potassium 07/08/2014 4.1  3.5 - 5.1 mmol/L Final  . Chloride 07/08/2014 103  96 - 112 mmol/L Final  . CO2 07/08/2014 26  19 - 32 mmol/L Final  . Glucose, Bld 07/08/2014 193* 70 - 99 mg/dL Final  . BUN 07/08/2014 15  6 - 23 mg/dL Final  . Creatinine, Ser 07/08/2014 0.62  0.50 - 1.10 mg/dL Final  . Calcium 07/08/2014 8.7  8.4 - 10.5 mg/dL Final  . GFR calc non Af Amer 07/08/2014 90* >90 mL/min Final  . GFR calc Af Amer 07/08/2014 >90  >90 mL/min Final   Comment: (NOTE) The eGFR has been calculated using the CKD EPI equation. This calculation has not been validated in all clinical situations. eGFR's persistently <90 mL/min signify possible Chronic Kidney Disease.   . Anion gap 07/08/2014 7  5 - 15 Final  . Glucose-Capillary 07/07/2014 326* 70 - 99 mg/dL Final  .  Glucose-Capillary 07/08/2014 218* 70 - 99 mg/dL Final  . Glucose-Capillary 07/08/2014 181* 70 - 99 mg/dL Final  . WBC 07/09/2014 7.9  4.0 - 10.5 K/uL Final   WHITE COUNT CONFIRMED ON SMEAR  . RBC 07/09/2014 3.43* 3.87 - 5.11 MIL/uL Final  . Hemoglobin 07/09/2014 9.5* 12.0 - 15.0 g/dL Final  . HCT 07/09/2014 28.9* 36.0 - 46.0 % Final  . MCV 07/09/2014 84.3  78.0 - 100.0 fL Final  . MCH 07/09/2014 27.7  26.0 - 34.0 pg Final  . MCHC 07/09/2014 32.9  30.0 - 36.0 g/dL Final  . RDW 07/09/2014 13.8  11.5 - 15.5 % Final  . Platelets 07/09/2014 285  150 - 400 K/uL Final  . Sodium 07/09/2014 132* 135 - 145 mmol/L Final  . Potassium 07/09/2014 4.3  3.5 - 5.1 mmol/L  Final  . Chloride 07/09/2014 99  96 - 112 mmol/L Final  . CO2 07/09/2014 23  19 - 32 mmol/L Final  . Glucose, Bld 07/09/2014 233* 70 - 99 mg/dL Final  . BUN 07/09/2014 8  6 - 23 mg/dL Final  . Creatinine, Ser 07/09/2014 0.58  0.50 - 1.10 mg/dL Final  . Calcium 07/09/2014 8.7  8.4 - 10.5 mg/dL Final  . GFR calc non Af Amer 07/09/2014 >90  >90 mL/min Final  . GFR calc Af Amer 07/09/2014 >90  >90 mL/min Final   Comment: (NOTE) The eGFR has been calculated using the CKD EPI equation. This calculation has not been validated in all clinical situations. eGFR's persistently <90 mL/min signify possible Chronic Kidney Disease.   . Anion gap 07/09/2014 10  5 - 15 Final  . Glucose-Capillary 07/08/2014 250* 70 - 99 mg/dL Final  . Comment 1 07/08/2014 Notify RN   Final  . Comment 2 07/08/2014 Document in Chart   Final  . Glucose-Capillary 07/09/2014 221* 70 - 99 mg/dL Final  . Glucose-Capillary 07/09/2014 222* 70 - 99 mg/dL Final  Hospital Outpatient Visit on 07/01/2014  Component Date Value Ref Range Status  . MRSA, PCR 07/01/2014 NEGATIVE  NEGATIVE Final  . Staphylococcus aureus 07/01/2014 NEGATIVE  NEGATIVE Final   Comment:        The Xpert SA Assay (FDA approved for NASAL specimens in patients over 61 years of age), is one component of a comprehensive surveillance program.  Test performance has been validated by [facility] for patients greater than or equal to 45 year old. It is not intended to diagnose infection nor to guide or monitor treatment.   Marland Kitchen aPTT 07/01/2014 27  24 - 37 seconds Final  . WBC 07/01/2014 4.9  4.0 - 10.5 K/uL Final  . RBC 07/01/2014 4.27  3.87 - 5.11 MIL/uL Final  . Hemoglobin 07/01/2014 12.0  12.0 - 15.0 g/dL Final  . HCT 07/01/2014 36.7  36.0 - 46.0 % Final  . MCV 07/01/2014 85.9  78.0 - 100.0 fL Final  . MCH 07/01/2014 28.1  26.0 - 34.0 pg Final  . MCHC 07/01/2014 32.7  30.0 - 36.0 g/dL Final  . RDW 07/01/2014 13.9  11.5 - 15.5 % Final  . Platelets  07/01/2014 331  150 - 400 K/uL Final  . Sodium 07/01/2014 141  135 - 145 mmol/L Final  . Potassium 07/01/2014 5.1  3.5 - 5.1 mmol/L Final  . Chloride 07/01/2014 102  96 - 112 mmol/L Final  . CO2 07/01/2014 29  19 - 32 mmol/L Final  . Glucose, Bld 07/01/2014 80  70 - 99 mg/dL Final  . BUN 07/01/2014  18  6 - 23 mg/dL Final  . Creatinine, Ser 07/01/2014 0.68  0.50 - 1.10 mg/dL Final  . Calcium 07/01/2014 10.1  8.4 - 10.5 mg/dL Final  . Total Protein 07/01/2014 7.1  6.0 - 8.3 g/dL Final  . Albumin 07/01/2014 4.2  3.5 - 5.2 g/dL Final  . AST 07/01/2014 22  0 - 37 U/L Final  . ALT 07/01/2014 14  0 - 35 U/L Final  . Alkaline Phosphatase 07/01/2014 41  39 - 117 U/L Final  . Total Bilirubin 07/01/2014 0.5  0.3 - 1.2 mg/dL Final  . GFR calc non Af Amer 07/01/2014 87* >90 mL/min Final  . GFR calc Af Amer 07/01/2014 >90  >90 mL/min Final   Comment: (NOTE) The eGFR has been calculated using the CKD EPI equation. This calculation has not been validated in all clinical situations. eGFR's persistently <90 mL/min signify possible Chronic Kidney Disease.   . Anion gap 07/01/2014 10  5 - 15 Final  . Prothrombin Time 07/01/2014 12.4  11.6 - 15.2 seconds Final  . INR 07/01/2014 0.92  0.00 - 1.49 Final  . ABO/RH(D) 07/01/2014 A POS   Final  . Antibody Screen 07/01/2014 NEG   Final  . Sample Expiration 07/01/2014 07/10/2014   Final  . Color, Urine 07/01/2014 YELLOW  YELLOW Final  . APPearance 07/01/2014 CLEAR  CLEAR Final  . Specific Gravity, Urine 07/01/2014 1.014  1.005 - 1.030 Final  . pH 07/01/2014 5.5  5.0 - 8.0 Final  . Glucose, UA 07/01/2014 NEGATIVE  NEGATIVE mg/dL Final  . Hgb urine dipstick 07/01/2014 NEGATIVE  NEGATIVE Final  . Bilirubin Urine 07/01/2014 NEGATIVE  NEGATIVE Final  . Ketones, ur 07/01/2014 NEGATIVE  NEGATIVE mg/dL Final  . Protein, ur 07/01/2014 NEGATIVE  NEGATIVE mg/dL Final  . Urobilinogen, UA 07/01/2014 0.2  0.0 - 1.0 mg/dL Final  . Nitrite 07/01/2014 NEGATIVE  NEGATIVE  Final  . Leukocytes, UA 07/01/2014 NEGATIVE  NEGATIVE Final   MICROSCOPIC NOT DONE ON URINES WITH NEGATIVE PROTEIN, BLOOD, LEUKOCYTES, NITRITE, OR GLUCOSE <1000 mg/dL.  . ABO/RH(D) 07/01/2014 A POS   Final     X-Rays:No results found.  EKG:No orders found for this or any previous visit.   Hospital Course: Porche EMMERIE BATTAGLIA is a 69 y.o. who was admitted to [facility]. They were brought to the operating room on 07/07/2014 and underwent Procedure(s): RIGHT TOTAL KNEE ARTHROPLASTY.  Patient tolerated the procedure well and was later transferred to the recovery room and then to the orthopaedic floor for postoperative care.  They were given PO and IV analgesics for pain control following their surgery.  They were given 24 hours of postoperative antibiotics of  Anti-infectives    Start     Dose/Rate Route Frequency Ordered Stop   07/07/14 1700  ceFAZolin (ANCEF) IVPB 2 g/50 mL premix     2 g 100 mL/hr over 30 Minutes Intravenous 4 times per day 07/07/14 1431 07/07/14 2335   07/07/14 0827  ceFAZolin (ANCEF) IVPB 2 g/50 mL premix     2 g 100 mL/hr over 30 Minutes Intravenous On call to O.R. 07/07/14 0827 07/07/14 1123     and started on DVT prophylaxis in the form of Xarelto.   PT and OT were ordered for total joint protocol.  Discharge planning consulted to help with postop disposition and equipment needs.  Patient had a decent night on the evening of surgery.  They started to get up OOB with therapy on day one. Hemovac drain  was pulled without difficulty.  Continued to work with therapy into day two.  Dressing was changed on day two and the incision was healing well.  Patient was seen in rounds and was ready to go home on day two.  Discharge home with home health Diet - Cardiac diet and Diabetic diet Follow up - in 2 weeks Activity - WBAT Disposition - Home Condition Upon Discharge - Good D/C Meds - See DC Summary DVT Prophylaxis - Xarelto        Discharge Instructions    Call  MD / Call 911    Complete by:  As directed   If you experience chest pain or shortness of breath, CALL 911 and be transported to the hospital emergency room.  If you develope a fever above 101 F, pus (white drainage) or increased drainage or redness at the wound, or calf pain, call your surgeon's office.     Change dressing    Complete by:  As directed   Change dressing daily with sterile 4 x 4 inch gauze dressing and apply TED hose. Do not submerge the incision under water.     Constipation Prevention    Complete by:  As directed   Drink plenty of fluids.  Prune juice may be helpful.  You may use a stool softener, such as Colace (over the counter) 100 mg twice a day.  Use MiraLax (over the counter) for constipation as needed.     Diet - low sodium heart healthy    Complete by:  As directed      Diet Carb Modified    Complete by:  As directed      Discharge instructions    Complete by:  As directed   Pick up stool softner and laxative for home use following surgery while on pain medications. Do not submerge incision under water. Please use good hand washing techniques while changing dressing each day. May shower starting three days after surgery. Please use a clean towel to pat the incision dry following showers. Continue to use ice for pain and swelling after surgery. Do not use any lotions or creams on the incision until instructed by your surgeon.  Take Xarelto for two and a half more weeks, then discontinue Xarelto. Once the patient has completed the Xarelto, they may resume the 81 mg Aspirin.  Postoperative Constipation Protocol  Constipation - defined medically as fewer than three stools per week and severe constipation as less than one stool per week.  One of the most common issues patients have following surgery is constipation.  Even if you have a regular bowel pattern at home, your normal regimen is likely to be disrupted due to multiple reasons following surgery.  Combination  of anesthesia, postoperative narcotics, change in appetite and fluid intake all can affect your bowels.  In order to avoid complications following surgery, here are some recommendations in order to help you during your recovery period.  Colace (docusate) - Pick up an over-the-counter form of Colace or another stool softener and take twice a day as long as you are requiring postoperative pain medications.  Take with a full glass of water daily.  If you experience loose stools or diarrhea, hold the colace until you stool forms back up.  If your symptoms do not get better within 1 week or if they get worse, check with your doctor.  Dulcolax (bisacodyl) - Pick up over-the-counter and take as directed by the product packaging as needed to assist with the  movement of your bowels.  Take with a full glass of water.  Use this product as needed if not relieved by Colace only.   MiraLax (polyethylene glycol) - Pick up over-the-counter to have on hand.  MiraLax is a solution that will increase the amount of water in your bowels to assist with bowel movements.  Take as directed and can mix with a glass of water, juice, soda, coffee, or tea.  Take if you go more than two days without a movement. Do not use MiraLax more than once per day. Call your doctor if you are still constipated or irregular after using this medication for 7 days in a row.  If you continue to have problems with postoperative constipation, please contact the office for further assistance and recommendations.  If you experience "the worst abdominal pain ever" or develop nausea or vomiting, please contact the office immediatly for further recommendations for treatment.     Do not put a pillow under the knee. Place it under the heel.    Complete by:  As directed      Do not sit on low chairs, stoools or toilet seats, as it may be difficult to get up from low surfaces    Complete by:  As directed      Driving restrictions    Complete by:  As  directed   No driving until released by the physician.     Increase activity slowly as tolerated    Complete by:  As directed      Lifting restrictions    Complete by:  As directed   No lifting until released by the physician.     Patient may shower    Complete by:  As directed   You may shower without a dressing once there is no drainage.  Do not wash over the wound.  If drainage remains, do not shower until drainage stops.     TED hose    Complete by:  As directed   Use stockings (TED hose) for 3 weeks on both leg(s).  You may remove them at night for sleeping.     Weight bearing as tolerated    Complete by:  As directed   Laterality:  right  Extremity:  Lower            Medication List    STOP taking these medications        aspirin EC 81 MG tablet     Calcium Carbonate-Vitamin D 500-125 MG-UNIT Tabs     Coenzyme Q10 200 MG capsule     ibuprofen 200 MG tablet  Commonly known as:  ADVIL,MOTRIN     Krill Oil 300 MG Caps     multivitamin with minerals Tabs tablet     OVER THE COUNTER MEDICATION      TAKE these medications        acetaminophen 500 MG tablet  Commonly known as:  TYLENOL  Take 500-1,000 mg by mouth every 6 (six) hours as needed (For pain.).     ARIPiprazole 30 MG tablet  Commonly known as:  ABILIFY  Take 30 mg by mouth at bedtime.     clonazePAM 1 MG tablet  Commonly known as:  KLONOPIN  Take 2-3 mg by mouth at bedtime.     fexofenadine 180 MG tablet  Commonly known as:  ALLEGRA  Take 180 mg by mouth daily as needed for allergies.     insulin lispro protamine-lispro (75-25) 100 UNIT/ML Susp injection  Commonly known as:  HUMALOG 75/25 MIX  Inject 10-14 Units into the skin 2 (two) times daily. She takes 10 units in the morning and 14 units in the evening.     metFORMIN 500 MG tablet  Commonly known as:  GLUCOPHAGE  Take 1,000 mg by mouth 2 (two) times daily after a meal.     methocarbamol 500 MG tablet  Commonly known as:  ROBAXIN    Take 1 tablet (500 mg total) by mouth every 6 (six) hours as needed for muscle spasms.     oxyCODONE 5 MG immediate release tablet  Commonly known as:  Oxy IR/ROXICODONE  Take 1-2 tablets (5-10 mg total) by mouth every 3 (three) hours as needed for moderate pain, severe pain or breakthrough pain.     pravastatin 20 MG tablet  Commonly known as:  PRAVACHOL  Take 20 mg by mouth daily after breakfast.     ramipril 5 MG capsule  Commonly known as:  ALTACE  Take 5 mg by mouth at bedtime.     rivaroxaban 10 MG Tabs tablet  Commonly known as:  XARELTO  - Take 1 tablet (10 mg total) by mouth daily with breakfast. Take Xarelto for two and a half more weeks, then discontinue Xarelto.  - Once the patient has completed the Xarelto, they may resume the 81 mg Aspirin.     sitaGLIPtin 100 MG tablet  Commonly known as:  JANUVIA  Take 100 mg by mouth daily with breakfast.     traMADol 50 MG tablet  Commonly known as:  ULTRAM  Take 1-2 tablets (50-100 mg total) by mouth every 6 (six) hours as needed (mild pain).       Follow-up Information    Follow up with [name], MD. Schedule an appointment as soon as possible for a visit on 07/22/2014.   Specialty:  Orthopedic Surgery   Why:  Call office at [phone] to setup appointment with Dr. Wynelle Link on Tuesday 07/22/2014   Contact information:   [address] [place] [address] [phone]       Follow up with [facility].   Why:  home health physical therapy   Contact information:   [place] [address] [phone]       Follow up with [facility].   Why:  [id number] (commode)   Contact information:   [place] [address] [phone]       Signed: Arlee Muslim, PA-C Orthopaedic Surgery 07/31/2014, 4:53 AM

## 2014-07-09 NOTE — Discharge Instructions (Signed)
° °Dr. Frank Aluisio °Total Joint Specialist °[facility] °[address] °[place], [place] [address] °[phone] ° °TOTAL KNEE REPLACEMENT POSTOPERATIVE DIRECTIONS ° ° ° °Knee Rehabilitation, Guidelines Following Surgery  °Results after knee surgery are often greatly improved when you follow the exercise, range of motion and muscle strengthening exercises prescribed by your doctor. Safety measures are also important to protect the knee from further injury. Any time any of these exercises cause you to have increased pain or swelling in your knee joint, decrease the amount until you are comfortable again and slowly increase them. If you have problems or questions, call your caregiver or physical therapist for advice.  ° °HOME CARE INSTRUCTIONS  °Remove items at home which could result in a fall. This includes throw rugs or furniture in walking pathways.  °Continue medications as instructed at time of discharge. °You may have some home medications which will be placed on hold until you complete the course of blood thinner medication.  °You may start showering once you are discharged home but do not submerge the incision under water. Just pat the incision dry and apply a dry gauze dressing on daily. °Walk with walker as instructed.  °You may resume a sexual relationship in one month or when given the OK by  your doctor.  °· Use walker as long as suggested by your caregivers. °· Avoid periods of inactivity such as sitting longer than an hour when not asleep. This helps prevent blood clots.  °You may put full weight on your legs and walk as much as is comfortable.  °You may return to work once you are cleared by your doctor.  °Do not drive a car for 6 weeks or until released by you surgeon.  °· Do not drive while taking narcotics.  °Wear the elastic stockings for three weeks following surgery during the day but you may remove then at night. °Make sure you keep all of your appointments after your  operation with all of your doctors and caregivers. You should call the office at the above phone number and make an appointment for approximately two weeks after the date of your surgery. °Change the dressing daily and reapply a dry dressing each time. °Please pick up a stool softener and laxative for home use as long as you are requiring pain medications. °· ICE to the affected knee every three hours for 30 minutes at a time and then as needed for pain and swelling.  Continue to use ice on the knee for pain and swelling from surgery. You may notice swelling that will progress down to the foot and ankle.  This is normal after surgery.  Elevate the leg when you are not up walking on it.   °It is important for you to complete the blood thinner medication as prescribed by your doctor. °· Continue to use the breathing machine which will help keep your temperature down.  It is common for your temperature to cycle up and down following surgery, especially at night when you are not up moving around and exerting yourself.  The breathing machine keeps your lungs expanded and your temperature down. ° °RANGE OF MOTION AND STRENGTHENING EXERCISES  °Rehabilitation of the knee is important following a knee injury or an operation. After just a few days of immobilization, the muscles of the thigh which control the knee become weakened and shrink (atrophy). Knee exercises are designed to build up the tone and strength of the thigh muscles and to improve knee   motion. Often times heat used for twenty to thirty minutes before working out will loosen up your tissues and help with improving the range of motion but do not use heat for the first two weeks following surgery. These exercises can be done on a training (exercise) mat, on the floor, on a table or on a bed. Use what ever works the best and is most comfortable for you Knee exercises include:  °Leg Lifts - While your knee is still immobilized in a splint or cast, you can do  straight leg raises. Lift the leg to 60 degrees, hold for 3 sec, and slowly lower the leg. Repeat 10-20 times 2-3 times daily. Perform this exercise against resistance later as your knee gets better.  °Quad and Hamstring Sets - Tighten up the muscle on the front of the thigh (Quad) and hold for 5-10 sec. Repeat this 10-20 times hourly. Hamstring sets are done by pushing the foot backward against an object and holding for 5-10 sec. Repeat as with quad sets.  °A rehabilitation program following serious knee injuries can speed recovery and prevent re-injury in the future due to weakened muscles. Contact your doctor or a physical therapist for more information on knee rehabilitation.  ° °SKILLED REHAB INSTRUCTIONS: °If the patient is transferred to a skilled rehab facility following release from the hospital, a list of the current medications will be sent to the facility for the patient to continue.  When discharged from the skilled rehab facility, please have the facility set up the patient's Home Health Physical Therapy prior to being released. Also, the skilled facility will be responsible for providing the patient with their medications at time of release from the facility to include their pain medication, the muscle relaxants, and their blood thinner medication. If the patient is still at the rehab facility at time of the two week follow up appointment, the skilled rehab facility will also need to assist the patient in arranging follow up appointment in our office and any transportation needs. ° °MAKE SURE YOU:  °Understand these instructions.  °Will watch your condition.  °Will get help right away if you are not doing well or get worse.  ° ° °Pick up stool softner and laxative for home use following surgery while on pain medications. °Do not submerge incision under water. °Please use good hand washing techniques while changing dressing each day. °May shower starting three days after surgery. °Please use a clean  towel to pat the incision dry following showers. °Continue to use ice for pain and swelling after surgery. °Do not use any lotions or creams on the incision until instructed by your surgeon. ° °Take Xarelto for two and a half more weeks, then discontinue Xarelto. °Once the patient has completed the Xarelto, they may resume the 81 mg Aspirin. ° °Postoperative Constipation Protocol ° °Constipation - defined medically as fewer than three stools per week and severe constipation as less than one stool per week. ° °One of the most common issues patients have following surgery is constipation.  Even if you have a regular bowel pattern at home, your normal regimen is likely to be disrupted due to multiple reasons following surgery.  Combination of anesthesia, postoperative narcotics, change in appetite and fluid intake all can affect your bowels.  In order to avoid complications following surgery, here are some recommendations in order to help you during your recovery period. ° °Colace (docusate) - Pick up an over-the-counter form of Colace or another stool softener and take   twice a day as long as you are requiring postoperative pain medications.  Take with a full glass of water daily.  If you experience loose stools or diarrhea, hold the colace until you stool forms back up.  If your symptoms do not get better within 1 week or if they get worse, check with your doctor.  Dulcolax (bisacodyl) - Pick up over-the-counter and take as directed by the product packaging as needed to assist with the movement of your bowels.  Take with a full glass of water.  Use this product as needed if not relieved by Colace only.   MiraLax (polyethylene glycol) - Pick up over-the-counter to have on hand.  MiraLax is a solution that will increase the amount of water in your bowels to assist with bowel movements.  Take as directed and can mix with a glass of water, juice, soda, coffee, or tea.  Take if you go more than two days without a  movement. Do not use MiraLax more than once per day. Call your doctor if you are still constipated or irregular after using this medication for 7 days in a row.  If you continue to have problems with postoperative constipation, please contact the office for further assistance and recommendations.  If you experience "the worst abdominal pain ever" or develop nausea or vomiting, please contact the office immediatly for further recommendations for treatment.    Information on my medicine - XARELTO (Rivaroxaban)  This medication education was reviewed with me or my healthcare representative as part of my discharge preparation.  The pharmacist that spoke with me during my hospital stay was:  Lolita Patella, [facility]  Why was Xarelto prescribed for you? Xarelto was prescribed for you to reduce the risk of blood clots forming after orthopedic surgery. The medical term for these abnormal blood clots is venous thromboembolism (VTE).  What do you need to know about xarelto ? Take your Xarelto ONCE DAILY at the same time every day. You may take it either with or without food.  If you have difficulty swallowing the tablet whole, you may crush it and mix in applesauce just prior to taking your dose.  Take Xarelto exactly as prescribed by your doctor and DO NOT stop taking Xarelto without talking to the doctor who prescribed the medication.  Stopping without other VTE prevention medication to take the place of Xarelto may increase your risk of developing a clot.  After discharge, you should have regular check-up appointments with your healthcare provider that is prescribing your Xarelto.    What do you do if you miss a dose? If you miss a dose, take it as soon as you remember on the same day then continue your regularly scheduled once daily regimen the next day. Do not take two doses of Xarelto on the same day.   Important Safety Information A possible side effect of Xarelto is  bleeding. You should call your healthcare provider right away if you experience any of the following: ? Bleeding from an injury or your nose that does not stop. ? Unusual colored urine (red or dark brown) or unusual colored stools (red or black). ? Unusual bruising for unknown reasons. ? A serious fall or if you hit your head (even if there is no bleeding).  Some medicines may interact with Xarelto and might increase your risk of bleeding while on Xarelto. To help avoid this, consult your healthcare provider or pharmacist prior to using any new prescription or non-prescription medications, including herbals,  vitamins, non-steroidal anti-inflammatory drugs (NSAIDs) and supplements.  This website has more information on Xarelto: [email or web address]

## 2014-07-09 NOTE — Progress Notes (Signed)
Discharge instructions given. Pt verbalized understanding and all questions were answered.  

## 2014-07-10 DIAGNOSIS — F319 Bipolar disorder, unspecified: Secondary | ICD-10-CM | POA: Diagnosis not present

## 2014-07-10 DIAGNOSIS — F419 Anxiety disorder, unspecified: Secondary | ICD-10-CM | POA: Diagnosis not present

## 2014-07-10 DIAGNOSIS — I10 Essential (primary) hypertension: Secondary | ICD-10-CM | POA: Diagnosis not present

## 2014-07-10 DIAGNOSIS — Z471 Aftercare following joint replacement surgery: Secondary | ICD-10-CM | POA: Diagnosis not present

## 2014-07-10 DIAGNOSIS — M1990 Unspecified osteoarthritis, unspecified site: Secondary | ICD-10-CM | POA: Diagnosis not present

## 2014-07-10 DIAGNOSIS — E119 Type 2 diabetes mellitus without complications: Secondary | ICD-10-CM | POA: Diagnosis not present

## 2014-07-11 DIAGNOSIS — M1990 Unspecified osteoarthritis, unspecified site: Secondary | ICD-10-CM | POA: Diagnosis not present

## 2014-07-11 DIAGNOSIS — I10 Essential (primary) hypertension: Secondary | ICD-10-CM | POA: Diagnosis not present

## 2014-07-11 DIAGNOSIS — E119 Type 2 diabetes mellitus without complications: Secondary | ICD-10-CM | POA: Diagnosis not present

## 2014-07-11 DIAGNOSIS — Z471 Aftercare following joint replacement surgery: Secondary | ICD-10-CM | POA: Diagnosis not present

## 2014-07-11 DIAGNOSIS — F319 Bipolar disorder, unspecified: Secondary | ICD-10-CM | POA: Diagnosis not present

## 2014-07-11 DIAGNOSIS — F419 Anxiety disorder, unspecified: Secondary | ICD-10-CM | POA: Diagnosis not present

## 2014-07-14 DIAGNOSIS — E119 Type 2 diabetes mellitus without complications: Secondary | ICD-10-CM | POA: Diagnosis not present

## 2014-07-14 DIAGNOSIS — F419 Anxiety disorder, unspecified: Secondary | ICD-10-CM | POA: Diagnosis not present

## 2014-07-14 DIAGNOSIS — I10 Essential (primary) hypertension: Secondary | ICD-10-CM | POA: Diagnosis not present

## 2014-07-14 DIAGNOSIS — F319 Bipolar disorder, unspecified: Secondary | ICD-10-CM | POA: Diagnosis not present

## 2014-07-14 DIAGNOSIS — M1990 Unspecified osteoarthritis, unspecified site: Secondary | ICD-10-CM | POA: Diagnosis not present

## 2014-07-14 DIAGNOSIS — Z471 Aftercare following joint replacement surgery: Secondary | ICD-10-CM | POA: Diagnosis not present

## 2014-07-15 DIAGNOSIS — F419 Anxiety disorder, unspecified: Secondary | ICD-10-CM | POA: Diagnosis not present

## 2014-07-15 DIAGNOSIS — M1990 Unspecified osteoarthritis, unspecified site: Secondary | ICD-10-CM | POA: Diagnosis not present

## 2014-07-15 DIAGNOSIS — E119 Type 2 diabetes mellitus without complications: Secondary | ICD-10-CM | POA: Diagnosis not present

## 2014-07-15 DIAGNOSIS — Z471 Aftercare following joint replacement surgery: Secondary | ICD-10-CM | POA: Diagnosis not present

## 2014-07-15 DIAGNOSIS — I10 Essential (primary) hypertension: Secondary | ICD-10-CM | POA: Diagnosis not present

## 2014-07-15 DIAGNOSIS — F319 Bipolar disorder, unspecified: Secondary | ICD-10-CM | POA: Diagnosis not present

## 2014-07-17 DIAGNOSIS — F319 Bipolar disorder, unspecified: Secondary | ICD-10-CM | POA: Diagnosis not present

## 2014-07-17 DIAGNOSIS — E119 Type 2 diabetes mellitus without complications: Secondary | ICD-10-CM | POA: Diagnosis not present

## 2014-07-17 DIAGNOSIS — Z471 Aftercare following joint replacement surgery: Secondary | ICD-10-CM | POA: Diagnosis not present

## 2014-07-17 DIAGNOSIS — I10 Essential (primary) hypertension: Secondary | ICD-10-CM | POA: Diagnosis not present

## 2014-07-17 DIAGNOSIS — M1990 Unspecified osteoarthritis, unspecified site: Secondary | ICD-10-CM | POA: Diagnosis not present

## 2014-07-17 DIAGNOSIS — F419 Anxiety disorder, unspecified: Secondary | ICD-10-CM | POA: Diagnosis not present

## 2014-07-18 DIAGNOSIS — M1711 Unilateral primary osteoarthritis, right knee: Secondary | ICD-10-CM | POA: Diagnosis not present

## 2014-07-21 DIAGNOSIS — M1711 Unilateral primary osteoarthritis, right knee: Secondary | ICD-10-CM | POA: Diagnosis not present

## 2014-07-22 DIAGNOSIS — Z96651 Presence of right artificial knee joint: Secondary | ICD-10-CM | POA: Diagnosis not present

## 2014-07-22 DIAGNOSIS — Z471 Aftercare following joint replacement surgery: Secondary | ICD-10-CM | POA: Diagnosis not present

## 2014-07-24 DIAGNOSIS — M1711 Unilateral primary osteoarthritis, right knee: Secondary | ICD-10-CM | POA: Diagnosis not present

## 2014-07-28 DIAGNOSIS — M1711 Unilateral primary osteoarthritis, right knee: Secondary | ICD-10-CM | POA: Diagnosis not present

## 2014-07-31 DIAGNOSIS — M1711 Unilateral primary osteoarthritis, right knee: Secondary | ICD-10-CM | POA: Diagnosis not present

## 2014-08-04 DIAGNOSIS — E1165 Type 2 diabetes mellitus with hyperglycemia: Secondary | ICD-10-CM | POA: Diagnosis not present

## 2014-08-04 DIAGNOSIS — E780 Pure hypercholesterolemia: Secondary | ICD-10-CM | POA: Diagnosis not present

## 2014-08-04 DIAGNOSIS — R809 Proteinuria, unspecified: Secondary | ICD-10-CM | POA: Diagnosis not present

## 2014-08-04 DIAGNOSIS — I10 Essential (primary) hypertension: Secondary | ICD-10-CM | POA: Diagnosis not present

## 2014-08-04 DIAGNOSIS — M1711 Unilateral primary osteoarthritis, right knee: Secondary | ICD-10-CM | POA: Diagnosis not present

## 2014-08-07 DIAGNOSIS — M1711 Unilateral primary osteoarthritis, right knee: Secondary | ICD-10-CM | POA: Diagnosis not present

## 2014-08-11 DIAGNOSIS — M1711 Unilateral primary osteoarthritis, right knee: Secondary | ICD-10-CM | POA: Diagnosis not present

## 2014-08-12 DIAGNOSIS — Z96651 Presence of right artificial knee joint: Secondary | ICD-10-CM | POA: Diagnosis not present

## 2014-08-12 DIAGNOSIS — Z471 Aftercare following joint replacement surgery: Secondary | ICD-10-CM | POA: Diagnosis not present

## 2014-08-14 DIAGNOSIS — M1711 Unilateral primary osteoarthritis, right knee: Secondary | ICD-10-CM | POA: Diagnosis not present

## 2014-08-19 DIAGNOSIS — M1711 Unilateral primary osteoarthritis, right knee: Secondary | ICD-10-CM | POA: Diagnosis not present

## 2014-08-21 DIAGNOSIS — M1711 Unilateral primary osteoarthritis, right knee: Secondary | ICD-10-CM | POA: Diagnosis not present

## 2014-08-25 DIAGNOSIS — M1711 Unilateral primary osteoarthritis, right knee: Secondary | ICD-10-CM | POA: Diagnosis not present

## 2014-08-28 DIAGNOSIS — M1711 Unilateral primary osteoarthritis, right knee: Secondary | ICD-10-CM | POA: Diagnosis not present

## 2014-09-01 DIAGNOSIS — M1711 Unilateral primary osteoarthritis, right knee: Secondary | ICD-10-CM | POA: Diagnosis not present

## 2014-09-04 DIAGNOSIS — Z471 Aftercare following joint replacement surgery: Secondary | ICD-10-CM | POA: Diagnosis not present

## 2014-09-04 DIAGNOSIS — Z96651 Presence of right artificial knee joint: Secondary | ICD-10-CM | POA: Diagnosis not present

## 2014-10-17 DIAGNOSIS — Z96651 Presence of right artificial knee joint: Secondary | ICD-10-CM | POA: Diagnosis not present

## 2014-10-17 DIAGNOSIS — Z471 Aftercare following joint replacement surgery: Secondary | ICD-10-CM | POA: Diagnosis not present

## 2014-12-04 DIAGNOSIS — R809 Proteinuria, unspecified: Secondary | ICD-10-CM | POA: Diagnosis not present

## 2014-12-04 DIAGNOSIS — I10 Essential (primary) hypertension: Secondary | ICD-10-CM | POA: Diagnosis not present

## 2014-12-04 DIAGNOSIS — E1165 Type 2 diabetes mellitus with hyperglycemia: Secondary | ICD-10-CM | POA: Diagnosis not present

## 2014-12-04 DIAGNOSIS — E780 Pure hypercholesterolemia: Secondary | ICD-10-CM | POA: Diagnosis not present

## 2015-01-14 DIAGNOSIS — E1165 Type 2 diabetes mellitus with hyperglycemia: Secondary | ICD-10-CM | POA: Diagnosis not present

## 2015-01-14 DIAGNOSIS — I10 Essential (primary) hypertension: Secondary | ICD-10-CM | POA: Diagnosis not present

## 2015-01-14 DIAGNOSIS — R809 Proteinuria, unspecified: Secondary | ICD-10-CM | POA: Diagnosis not present

## 2015-01-14 DIAGNOSIS — E780 Pure hypercholesterolemia: Secondary | ICD-10-CM | POA: Diagnosis not present

## 2015-01-23 DIAGNOSIS — M1712 Unilateral primary osteoarthritis, left knee: Secondary | ICD-10-CM | POA: Diagnosis not present

## 2015-01-23 DIAGNOSIS — Z96651 Presence of right artificial knee joint: Secondary | ICD-10-CM | POA: Diagnosis not present

## 2015-01-23 DIAGNOSIS — Z471 Aftercare following joint replacement surgery: Secondary | ICD-10-CM | POA: Diagnosis not present

## 2015-02-11 DIAGNOSIS — M1712 Unilateral primary osteoarthritis, left knee: Secondary | ICD-10-CM | POA: Diagnosis not present

## 2015-02-18 DIAGNOSIS — M1712 Unilateral primary osteoarthritis, left knee: Secondary | ICD-10-CM | POA: Diagnosis not present

## 2015-02-20 DIAGNOSIS — Z23 Encounter for immunization: Secondary | ICD-10-CM | POA: Diagnosis not present

## 2015-02-27 DIAGNOSIS — M1712 Unilateral primary osteoarthritis, left knee: Secondary | ICD-10-CM | POA: Diagnosis not present

## 2015-03-25 DIAGNOSIS — Z0000 Encounter for general adult medical examination without abnormal findings: Secondary | ICD-10-CM | POA: Diagnosis not present

## 2015-03-25 DIAGNOSIS — E1165 Type 2 diabetes mellitus with hyperglycemia: Secondary | ICD-10-CM | POA: Diagnosis not present

## 2015-03-25 DIAGNOSIS — F339 Major depressive disorder, recurrent, unspecified: Secondary | ICD-10-CM | POA: Diagnosis not present

## 2015-03-25 DIAGNOSIS — M8580 Other specified disorders of bone density and structure, unspecified site: Secondary | ICD-10-CM | POA: Diagnosis not present

## 2015-03-25 DIAGNOSIS — Z1159 Encounter for screening for other viral diseases: Secondary | ICD-10-CM | POA: Diagnosis not present

## 2015-03-25 DIAGNOSIS — I10 Essential (primary) hypertension: Secondary | ICD-10-CM | POA: Diagnosis not present

## 2015-03-25 DIAGNOSIS — E782 Mixed hyperlipidemia: Secondary | ICD-10-CM | POA: Diagnosis not present

## 2015-03-30 DIAGNOSIS — D225 Melanocytic nevi of trunk: Secondary | ICD-10-CM | POA: Diagnosis not present

## 2015-03-30 DIAGNOSIS — L812 Freckles: Secondary | ICD-10-CM | POA: Diagnosis not present

## 2015-03-30 DIAGNOSIS — L821 Other seborrheic keratosis: Secondary | ICD-10-CM | POA: Diagnosis not present

## 2015-04-09 ENCOUNTER — Ambulatory Visit: Payer: Self-pay | Admitting: Orthopedic Surgery

## 2015-04-09 DIAGNOSIS — M1712 Unilateral primary osteoarthritis, left knee: Secondary | ICD-10-CM | POA: Diagnosis not present

## 2015-04-09 NOTE — Progress Notes (Signed)
Preoperative surgical orders have been place into the Epic hospital system for [name] on 04/09/2015, 12:11 PM  by Mickel Crow for surgery on 07-05-2014.  Preop Total Knee orders including Experal, IV Tylenol, and IV Decadron as long as there are no contraindications to the above medications. Arlee Muslim, PA-C

## 2015-05-11 DIAGNOSIS — E119 Type 2 diabetes mellitus without complications: Secondary | ICD-10-CM | POA: Diagnosis not present

## 2015-05-11 DIAGNOSIS — H524 Presbyopia: Secondary | ICD-10-CM | POA: Diagnosis not present

## 2015-06-10 DIAGNOSIS — E113599 Type 2 diabetes mellitus with proliferative diabetic retinopathy without macular edema, unspecified eye: Secondary | ICD-10-CM | POA: Diagnosis not present

## 2015-06-10 DIAGNOSIS — Z01810 Encounter for preprocedural cardiovascular examination: Secondary | ICD-10-CM | POA: Diagnosis not present

## 2015-06-12 ENCOUNTER — Ambulatory Visit: Payer: Self-pay | Admitting: Orthopedic Surgery

## 2015-06-12 NOTE — H&P (Signed)
[name] DOB: 11/09/1945 Married / Language: English / Race: White Female Date of Admission: 07/06/2015 CC: Left Knee Pain History of Present Illness The patient is a 70 year old female who comes in for a preoperative History and Physical. The patient is scheduled for a left total knee arthroplasty to be performed by Dr. Dione Plover. Aluisio, MD at [facility] on 07-06-2015. The patient is a 70 year old female who presented for follow up of their knee. The patient is being followed for their left knee pain and osteoarthritis. They are now out from Euflexxa series (helpful). She got temporary partial relilef with the gel series. The following medication has been used for pain control: Tylenol, Ultram and Methocarbamol. She feels as though the right knee is doing very well at this time. She ended up having a series of euflexxa, but it has now worked. She is a little better, but she still has activity limiting discomfort. She is eagerly anticipating going ahead and getting the knee replaced. She is ready to proceed with her left knee replacement. They have been treated conservatively in the past for the above stated problem and despite conservative measures, they continue to have progressive pain and severe functional limitations and dysfunction. They have failed non-operative management including home exercise, medications, and injections. It is felt that they would benefit from undergoing total joint replacement. Risks and benefits of the procedure have been discussed with the patient and they elect to proceed with surgery. There are no active contraindications to surgery such as ongoing infection or rapidly progressive neurological disease.  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 Sulfa drugs Penicillin V Potassium *PENICILLINS*  Rash. Latex  Itching, Rash.  Family History Cancer  First Degree Relatives. father Depression  grandmother fathers side Diabetes Mellitus  father Osteoarthritis  father Rheumatoid Arthritis  child First Degree Relatives   Social History  Alcohol use  current drinker; drinks wine and hard liquor; less than 5 per week Tobacco use  Never smoker. never smoker Children  1 Current work status  unemployed Drug/Alcohol Rehab (Currently)  no Drug/Alcohol Rehab (Previously)  no Exercise  Exercises weekly; does running / walking Illicit drug use  no Living situation  live with spouse Marital status  married Number of flights of stairs before winded  4-5 Pain Contract  no Tobacco / smoke exposure  no Advance Directives  Living Will, Healthcare POA [place] following the Left TKA performed on 07/06/2015.  Medication History  Abilify (30MG  Tablet, Oral) Active. (once per day) ClonazePAM (1MG  Tablet, Oral) Active. (2 at bedtime) Ramipril (5MG  Capsule, Oral) Active. (one per day) Methocarbamol (500MG  Tablet, Oral) Active. Pravachol (20MG  Tablet, Oral) Active. (once a day) MetFORMIN HCl ER (500MG  Tablet ER 24HR, Oral) Active. (2 twice daily after eating) Januvia (100MG  Tablet, Oral) Active.  (one per day) HumaLOG Mix 75/25  KwikPen ((75-25) 100UNIT/ML Suspension, Subcutaneous) Active. (twice daily/10 to 12 units) TraMADol HCl (50MG  Tablet, Oral) Active. Tylenol Extra Strength (500MG  Tablet, Oral) Active. (as needed) Tylenol with Codeine #3 (300-30MG  Tablet, 1 (one) Tablet Oral q8, Taken starting 02/18/2015) Active. Chlor-Trimeton (4MG  Tablet, Oral) Active. Co Q10 (200MG  Capsule, Oral) Active. (once per day) PreserVision AREDS (Oral) Active. Calcium-Vitamin D (600MG  Tablet Chewable, Oral) Active. Turmeric (500MG  Capsule, Oral) Active. (once per day) Cumin Oil Specific strength unknown - Active. (500mg /2 tablets once daily) Krill Oil (300MG  Capsule, Oral) Active. (once per day) Glucosamine Chondroitin Complx (Oral) Active. Multiple Vitamin (1 (one) Oral) Active. (one per day)  Past Surgical History Tonsillectomy  Date: 4. Breast Biopsy  left Arthroscopy of Knee  right Dilation and Curettage of Uterus  Date: 1989. Breast Mass; Local Excision  Date: 1995. left   Review of Systems (Alexzandrew L. Perkins III PA-C; 06/09/2015 2:48 PM) General Not Present- Chills, Fatigue, Fever, Memory Loss, Night Sweats, Weight Gain and Weight Loss. Skin Not Present- Eczema, Hives, Itching, Lesions and Rash. HEENT Not Present- Dentures, Double Vision, Headache, Hearing Loss, Tinnitus and Visual Loss. Respiratory Not Present- Allergies, Chronic Cough, Coughing up blood, Shortness of breath at rest and Shortness of breath with exertion. Cardiovascular Not Present- Chest Pain, Difficulty Breathing Lying Down, Murmur, Palpitations, Racing/skipping heartbeats and Swelling. Gastrointestinal Not Present- Abdominal Pain, Bloody Stool, Constipation, Diarrhea, Difficulty Swallowing, Heartburn, Jaundice, Loss of appetitie, Nausea and Vomiting. Female Genitourinary Not Present- Blood in Urine, Discharge, Flank Pain, Incontinence, Painful Urination, Urgency, Urinary frequency, Urinary  Retention, Urinating at Night and Weak urinary stream. Musculoskeletal Present- Back Pain (sometimes), Morning Stiffness and Muscle Pain. Not Present- Joint Pain, Joint Swelling, Muscle Weakness and Spasms. Neurological Not Present- Blackout spells, Difficulty with balance, Dizziness, Paralysis, Tremor and Weakness. Psychiatric Not Present- Insomnia.  Vitals  Weight: 167 lb Height: 64.75in Weight was reported by patient. Height was reported by patient. Body Surface Area: 1.83 m Body Mass Index: 28 kg/m  Pulse: 76 (Regular)  BP: 134/76 (Sitting, Right Arm, Standard)   Physical Exam General Mental Status -Alert, cooperative and good historian. General Appearance-pleasant, Not in acute distress. Orientation-Oriented X3. Build & Nutrition-Well nourished and Well developed.  Head and Neck Head-normocephalic, atraumatic . Neck Global Assessment - supple, no bruit auscultated on the right, no bruit auscultated on the left.  Eye Vision-Wears corrective lenses. Pupil - Bilateral-Round and Unequal. Motion - Bilateral-EOMI.  Chest and Lung Exam Auscultation Breath sounds - clear at anterior chest wall and clear at posterior chest wall. Adventitious sounds - No Adventitious sounds.  Cardiovascular Auscultation Rhythm - Regular rate and rhythm. Heart Sounds - S1 WNL and S2 WNL. Murmurs & Other Heart Sounds - Auscultation of the heart reveals - No Murmurs.  Abdomen Palpation/Percussion Tenderness - Abdomen is non-tender to palpation. Rigidity (guarding) - Abdomen is soft. Auscultation Auscultation of the abdomen reveals - Bowel sounds normal.  Female Genitourinary Note: Not done, not pertinent to present illness   Musculoskeletal Note: On exam, she is alert and oriented, in no apparent distress. Her right knee looks great. There is no swelling. Ranged 0 to 125. No tenderness or instability. Left knee ranged about 5 to 120. Marked crepitus on range of  motion with tenderness, medial greater than lateral, no instability noted.  Assessment & Plan Primary osteoarthritis of left knee (M17.12)  Note:Surgical Plans: Left Total Knee Replacement  Disposition: Home  PCP: Dr. Darcus Austin - Patient has been seen preoperatively and felt to be stable for surgery. Cards: Dr.  Tilley  Topical TXA - Breast Cancer  Anesthesia Issues: None, did well with the spinal last knee surgery.  Signed electronically by Joelene Millin, III PA-C

## 2015-06-12 NOTE — H&P (Signed)
[name] DOB: 20-Feb-1946 Married / Language: English / Race: White Female Date of Admission:  07/06/2015 CC: Left Knee Pain History of Present Illness The patient is a 70 year old female who comes in for a preoperative History and Physical. The patient is scheduled for a left total knee arthroplasty to be performed by Dr. Dione Plover. Aluisio, MD at [facility] on 07-06-2015. The patient is a 70 year old female who presented for follow up of their knee. The patient is being followed for their left knee pain and osteoarthritis. They are now out from Euflexxa series (helpful). She got temporary partial relilef with the gel series. The following medication has been used for pain control: Tylenol, Ultram and Methocarbamol. She feels as though the right knee is doing very well at this time. She ended up having a series of euflexxa, but it has now worked. She is a little better, but she still has activity limiting discomfort. She is eagerly anticipating going ahead and getting the knee replaced. She is ready to proceed with her left knee replacement. They have been treated conservatively in the past for the above stated problem and despite conservative measures, they continue to have progressive pain and severe functional limitations and dysfunction. They have failed non-operative management including home exercise, medications, and injections. It is felt that they would benefit from undergoing total joint replacement. Risks and benefits of the procedure have been discussed with the patient and they elect to proceed with surgery. There are no active contraindications to surgery such as ongoing infection or rapidly progressive neurological disease.  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 Sulfa drugs Penicillin V Potassium *PENICILLINS*  Rash. Latex  Itching, Rash.  Family History Cancer  First Degree Relatives. father Depression  grandmother fathers side Diabetes Mellitus  father Osteoarthritis  father Rheumatoid Arthritis  child First Degree Relatives   Social History  Alcohol use  current drinker; drinks wine and hard liquor; less than 5 per week Tobacco use  Never smoker. never smoker Children  1 Current work status  unemployed Drug/Alcohol Rehab (Currently)  no Drug/Alcohol Rehab (Previously)  no Exercise  Exercises weekly; does running / walking Illicit drug use  no Living situation  live with spouse Marital status  married Number of flights of stairs before winded  4-5 Pain Contract  no Tobacco / smoke exposure  no Advance Directives  Living Will, Healthcare POA [place] following the Left TKA performed on 07/06/2015.  Medication History  Abilify (30MG  Tablet, Oral) Active. (once per day) ClonazePAM (1MG  Tablet, Oral) Active. (2 at bedtime) Ramipril (5MG  Capsule, Oral) Active. (one per day) Methocarbamol (500MG  Tablet, Oral) Active. Pravachol (20MG  Tablet, Oral) Active. (once a day) MetFORMIN HCl ER (500MG  Tablet ER 24HR, Oral) Active. (2 twice daily after eating) Januvia (100MG  Tablet, Oral) Active. (one  per day) HumaLOG Mix  75/25 KwikPen ((75-25) 100UNIT/ML Suspension, Subcutaneous) Active. (twice daily/10 to 12 units) TraMADol HCl (50MG  Tablet, Oral) Active. Tylenol Extra Strength (500MG  Tablet, Oral) Active. (as needed) Tylenol with Codeine #3 (300-30MG  Tablet, 1 (one) Tablet Oral q8, Taken starting 02/18/2015) Active. Chlor-Trimeton (4MG  Tablet, Oral) Active. Co Q10 (200MG  Capsule, Oral) Active. (once per day) PreserVision AREDS (Oral) Active. Calcium-Vitamin D (600MG  Tablet Chewable, Oral) Active. Turmeric (500MG  Capsule, Oral) Active. (once per day) Cumin Oil Specific strength unknown - Active. (500mg /2 tablets once daily) Krill Oil (300MG  Capsule, Oral) Active. (once per day) Glucosamine Chondroitin Complx (Oral) Active. Multiple Vitamin (1 (one) Oral) Active. (one per day)  Past Surgical History Tonsillectomy  Date: 63. Breast Biopsy  left Arthroscopy of Knee  right Dilation and Curettage of Uterus  Date: 1989. Breast Mass; Local Excision  Date: 1995. left   Review of Systems ([name]; 06/09/2015 2:48 PM) General Not Present- Chills, Fatigue, Fever, Memory Loss, Night Sweats, Weight Gain and Weight Loss. Skin Not Present- Eczema, Hives, Itching, Lesions and Rash. HEENT Not Present- Dentures, Double Vision, Headache, Hearing Loss, Tinnitus and Visual Loss. Respiratory Not Present- Allergies, Chronic Cough, Coughing up blood, Shortness of breath at rest and Shortness of breath with exertion. Cardiovascular Not Present- Chest Pain, Difficulty Breathing Lying Down, Murmur, Palpitations, Racing/skipping heartbeats and Swelling. Gastrointestinal Not Present- Abdominal Pain, Bloody Stool, Constipation, Diarrhea, Difficulty Swallowing, Heartburn, Jaundice, Loss of appetitie, Nausea and Vomiting. Female Genitourinary Not Present- Blood in Urine, Discharge, Flank Pain, Incontinence, Painful Urination, Urgency, Urinary frequency, Urinary  Retention, Urinating at Night and Weak urinary stream. Musculoskeletal Present- Back Pain (sometimes), Morning Stiffness and Muscle Pain. Not Present- Joint Pain, Joint Swelling, Muscle Weakness and Spasms. Neurological Not Present- Blackout spells, Difficulty with balance, Dizziness, Paralysis, Tremor and Weakness. Psychiatric Not Present- Insomnia.  Vitals  Weight: 167 lb Height: 64.75in Weight was reported by patient. Height was reported by patient. Body Surface Area: 1.83 m Body Mass Index: 28 kg/m  Pulse: 76 (Regular)  BP: 134/76 (Sitting, Right Arm, Standard)   Physical Exam General Mental Status -Alert, cooperative and good historian. General Appearance-pleasant, Not in acute distress. Orientation-Oriented X3. Build & Nutrition-Well nourished and Well developed.  Head and Neck Head-normocephalic, atraumatic . Neck Global Assessment - supple, no bruit auscultated on the right, no bruit auscultated on the left.  Eye Vision-Wears corrective lenses. Pupil - Bilateral-Round and Unequal. Motion - Bilateral-EOMI.  Chest and Lung Exam Auscultation Breath sounds - clear at anterior chest wall and clear at posterior chest wall. Adventitious sounds - No Adventitious sounds.  Cardiovascular Auscultation Rhythm - Regular rate and rhythm. Heart Sounds - S1 WNL and S2 WNL. Murmurs & Other Heart Sounds - Auscultation of the heart reveals - No Murmurs.  Abdomen Palpation/Percussion Tenderness - Abdomen is non-tender to palpation. Rigidity (guarding) - Abdomen is soft. Auscultation Auscultation of the abdomen reveals - Bowel sounds normal.  Female Genitourinary Note: Not done, not pertinent to present illness   Musculoskeletal Note: On exam, she is alert and oriented, in no apparent distress. Her right knee looks great. There is no swelling. Ranged 0 to 125. No tenderness or instability. Left knee ranged about 5 to 120. Marked crepitus on range of  motion with tenderness, medial greater than lateral, no instability noted.  Assessment & Plan Primary osteoarthritis of left knee (M17.12)  Note:Surgical Plans: Left Total Knee Replacement  Disposition: Home  PCP: Dr. Darcus Austin - Patient has been seen preoperatively and felt to be stable for surgery. Cards:  Dr. Wynonia Lawman  Topical TXA - Breast Cancer  Anesthesia Issues: None, did well with the spinal last knee surgery.  Signed electronically by Joelene Millin, III Cooke

## 2015-06-24 NOTE — Patient Instructions (Addendum)
[name]  06/24/2015   Your procedure is scheduled on: Monday  07/06/2015  Report to [facility] Main  Entrance take [place]  elevators to 3rd floor to  [place] at  [place] AM.  Call this number if you have problems the morning of surgery [phone]   Remember: ONLY 1 PERSON MAY GO WITH YOU TO SHORT STAY TO GET  READY MORNING OF [place].   Do not eat food or drink liquids : After Midnight.  Have a healthy snack before bed the night before surgery.  1/2 your dose of insulin the night before surgery   Take these medicines the morning of surgery with A SIP OF WATER: pravastatin              DO NOT TAKE ANY DIABETIC MEDICATIONS DAY OF YOUR SURGERY!                               You may not have any metal on your body including hair pins and              piercings  Do not wear jewelry, make-up, lotions, powders or perfumes, deodorant             Do not wear nail polish.  Do not shave  48 hours prior to surgery.              Men may shave face and neck.   Do not bring valuables to the hospital. [employer].  Contacts, dentures or bridgework may not be worn into surgery.  Leave suitcase in the car. After surgery it may be brought to your room.                   Please read over the following fact sheets you were given: _____________________________________________________________________             [facility] - Preparing for Surgery Before surgery, you can play an important role.  Because skin is not sterile, your skin needs to be as free of germs as possible.  You can reduce the number of germs on your skin by washing with CHG (chlorahexidine gluconate) soap before surgery.  CHG is an antiseptic cleaner which kills germs and bonds with the skin to continue killing germs even after washing. Please DO NOT use if you have an allergy to CHG or antibacterial soaps.  If your skin becomes  reddened/irritated stop using the CHG and inform your nurse when you arrive at Short Stay. Do not shave (including legs and underarms) for at least 48 hours prior to the first CHG shower.  You may shave your face/neck. Please follow these instructions carefully:  1.  Shower with CHG Soap the night before surgery and the  morning of Surgery.  2.  If you choose to wash your hair, wash your hair first as usual with your  normal  shampoo.  3.  After you shampoo, rinse your hair and body thoroughly to remove the  shampoo.                           4.  Use CHG as you would any other liquid soap.  You can apply chg directly  to the skin and wash                       Gently with a scrungie or clean washcloth.  5.  Apply the CHG Soap to your body ONLY FROM THE NECK DOWN.   Do not use on face/ open                           Wound or open sores. Avoid contact with eyes, ears mouth and genitals (private parts).                       Wash face,  Genitals (private parts) with your normal soap.             6.  Wash thoroughly, paying special attention to the area where your surgery  will be performed.  7.  Thoroughly rinse your body with warm water from the neck down.  8.  DO NOT shower/wash with your normal soap after using and rinsing off  the CHG Soap.                9.  Pat yourself dry with a clean towel.            10.  Wear clean pajamas.            11.  Place clean sheets on your bed the night of your first shower and do not  sleep with pets. Day of Surgery : Do not apply any lotions/deodorants the morning of surgery.  Please wear clean clothes to the hospital/surgery center.  FAILURE TO FOLLOW THESE INSTRUCTIONS MAY RESULT IN THE CANCELLATION OF YOUR SURGERY PATIENT SIGNATURE_________________________________  NURSE SIGNATURE__________________________________  ________________________________________________________________________   [name]  An incentive spirometer is a tool that  can help keep your lungs clear and active. This tool measures how well you are filling your lungs with each breath. Taking long deep breaths may help reverse or decrease the chance of developing breathing (pulmonary) problems (especially infection) following:  A long period of time when you are unable to move or be active. BEFORE THE PROCEDURE   If the spirometer includes an indicator to show your best effort, your nurse or respiratory therapist will set it to a desired goal.  If possible, sit up straight or lean slightly forward. Try not to slouch.  Hold the incentive spirometer in an upright position. INSTRUCTIONS FOR USE   Sit on the edge of your bed if possible, or sit up as far as you can in bed or on a chair.  Hold the incentive spirometer in an upright position.  Breathe out normally.  Place the mouthpiece in your mouth and seal your lips tightly around it.  Breathe in slowly and as deeply as possible, raising the piston or the ball toward the top of the column.  Hold your breath for 3-5 seconds or for as long as possible. Allow the piston or ball to fall to the bottom of the column.  Remove the mouthpiece from your mouth and breathe out normally.  Rest for a few seconds and repeat Steps 1 through 7 at least 10 times every 1-2 hours when you are awake. Take your time and take a few normal breaths between deep breaths.  The spirometer may include an indicator to show your best effort. Use the indicator as a goal to work toward  during each repetition.  After each set of 10 deep breaths, practice coughing to be sure your lungs are clear. If you have an incision (the cut made at the time of surgery), support your incision when coughing by placing a pillow or rolled up towels firmly against it. Once you are able to get out of bed, walk around indoors and cough well. You may stop using the incentive spirometer when instructed by your caregiver.  RISKS AND COMPLICATIONS  Take your  time so you do not get dizzy or light-headed.  If you are in pain, you may need to take or ask for pain medication before doing incentive spirometry. It is harder to take a deep breath if you are having pain. AFTER USE  Rest and breathe slowly and easily.  It can be helpful to keep track of a log of your progress. Your caregiver can provide you with a simple table to help with this. If you are using the spirometer at home, follow these instructions: [place] IF:   You are having difficultly using the spirometer.  You have trouble using the spirometer as often as instructed.  Your pain medication is not giving enough relief while using the spirometer.  You develop fever of 100.5 F (38.1 C) or higher. SEEK IMMEDIATE MEDICAL CARE IF:   You cough up bloody sputum that had not been present before.  You develop fever of 102 F (38.9 C) or greater.  You develop worsening pain at or near the incision site. MAKE SURE YOU:   Understand these instructions.  Will watch your condition.  Will get help right away if you are not doing well or get worse. Document Released: 09/05/2006 Document Revised: 07/18/2011 Document Reviewed: 11/06/2006 ExitCare Patient Information 2014 ExitCare, [place].   ________________________________________________________________________  WHAT IS A BLOOD TRANSFUSION? Blood Transfusion Information  A transfusion is the replacement of blood or some of its parts. Blood is made up of multiple cells which provide different functions.  Red blood cells carry oxygen and are used for blood loss replacement.  White blood cells fight against infection.  Platelets control bleeding.  Plasma helps clot blood.  Other blood products are available for specialized needs, such as hemophilia or other clotting disorders. BEFORE THE TRANSFUSION  Who gives blood for transfusions?   Healthy volunteers who are fully evaluated to make sure their blood is safe. This  is blood bank blood. Transfusion therapy is the safest it has ever been in the practice of medicine. Before blood is taken from a donor, a complete history is taken to make sure that person has no history of diseases nor engages in risky social behavior (examples are intravenous drug use or sexual activity with multiple partners). The donor's travel history is screened to minimize risk of transmitting infections, such as malaria. The donated blood is tested for signs of infectious diseases, such as HIV and hepatitis. The blood is then tested to be sure it is compatible with you in order to minimize the chance of a transfusion reaction. If you or a relative donates blood, this is often done in anticipation of surgery and is not appropriate for emergency situations. It takes many days to process the donated blood. RISKS AND COMPLICATIONS Although transfusion therapy is very safe and saves many lives, the main dangers of transfusion include:   Getting an infectious disease.  Developing a transfusion reaction. This is an allergic reaction to something in the blood you were given. Every precaution is taken to  prevent this. The decision to have a blood transfusion has been considered carefully by your caregiver before blood is given. Blood is not given unless the benefits outweigh the risks. AFTER THE TRANSFUSION  Right after receiving a blood transfusion, you will usually feel much better and more energetic. This is especially true if your red blood cells have gotten low (anemic). The transfusion raises the level of the red blood cells which carry oxygen, and this usually causes an energy increase.  The nurse administering the transfusion will monitor you carefully for complications. HOME CARE INSTRUCTIONS  No special instructions are needed after a transfusion. You may find your energy is better. Speak with your caregiver about any limitations on activity for underlying diseases you may have. SEEK  MEDICAL CARE IF:   Your condition is not improving after your transfusion.  You develop redness or irritation at the intravenous (IV) site. SEEK IMMEDIATE MEDICAL CARE IF:  Any of the following symptoms occur over the next 12 hours:  Shaking chills.  You have a temperature by mouth above 102 F (38.9 C), not controlled by medicine.  Chest, back, or muscle pain.  People around you feel you are not acting correctly or are confused.  Shortness of breath or difficulty breathing.  Dizziness and fainting.  You get a rash or develop hives.  You have a decrease in urine output.  Your urine turns a dark color or changes to pink, red, or brown. Any of the following symptoms occur over the next 10 days:  You have a temperature by mouth above 102 F (38.9 C), not controlled by medicine.  Shortness of breath.  Weakness after normal activity.  The white part of the eye turns yellow (jaundice).  You have a decrease in the amount of urine or are urinating less often.  Your urine turns a dark color or changes to pink, red, or brown. Document Released: 04/22/2000 Document Revised: 07/18/2011 Document Reviewed: 12/10/2007 [facility] Patient Information 2014 [place], [place].  _______________________________________________________________________

## 2015-06-24 NOTE — Progress Notes (Signed)
06/10/2015-Last Office visit with Dr. Lucy Chris and EKG on chart. 03/25/2015-Pre-operative clearance from Dr. Inda Merlin on chart. 01/14/2015-Last Office visit from Dr. Jacelyn Pi on chart. 06/26/2014-Lexiscan Cardiolite from Dr. Thurman Coyer on chart.

## 2015-06-26 ENCOUNTER — Encounter (HOSPITAL_COMMUNITY): Payer: Self-pay

## 2015-06-26 ENCOUNTER — Encounter (HOSPITAL_COMMUNITY)
Admission: RE | Admit: 2015-06-26 | Discharge: 2015-06-26 | Disposition: A | Discharge status: Home / Self Care | Payer: Medicare Other | Source: Ambulatory Visit | Attending: Orthopedic Surgery | Admitting: Orthopedic Surgery

## 2015-06-26 DIAGNOSIS — M1712 Unilateral primary osteoarthritis, left knee: Secondary | ICD-10-CM | POA: Insufficient documentation

## 2015-06-26 DIAGNOSIS — Z01812 Encounter for preprocedural laboratory examination: Secondary | ICD-10-CM | POA: Insufficient documentation

## 2015-06-26 DIAGNOSIS — Z0183 Encounter for blood typing: Secondary | ICD-10-CM | POA: Diagnosis not present

## 2015-06-26 HISTORY — DX: Unspecified asthma, uncomplicated: J45.909

## 2015-06-26 LAB — COMPREHENSIVE METABOLIC PANEL
ALT: 14 U/L (ref 14–54)
AST: 21 U/L (ref 15–41)
Albumin: 4.1 g/dL (ref 3.5–5.0)
Alkaline Phosphatase: 42 U/L (ref 38–126)
Anion gap: 9 (ref 5–15)
BUN: 16 mg/dL (ref 6–20)
CHLORIDE: 102 mmol/L (ref 101–111)
CO2: 26 mmol/L (ref 22–32)
CREATININE: 0.64 mg/dL (ref 0.44–1.00)
Calcium: 10 mg/dL (ref 8.9–10.3)
Glucose, Bld: 41 mg/dL — CL (ref 65–99)
Potassium: 4.8 mmol/L (ref 3.5–5.1)
Sodium: 137 mmol/L (ref 135–145)
TOTAL PROTEIN: 7.1 g/dL (ref 6.5–8.1)
Total Bilirubin: 0.4 mg/dL (ref 0.3–1.2)

## 2015-06-26 LAB — URINE MICROSCOPIC-ADD ON: RBC / HPF: NONE SEEN RBC/hpf (ref 0–5)

## 2015-06-26 LAB — URINALYSIS, ROUTINE W REFLEX MICROSCOPIC
Bilirubin Urine: NEGATIVE
GLUCOSE, UA: NEGATIVE mg/dL
Hgb urine dipstick: NEGATIVE
KETONES UR: NEGATIVE mg/dL
Nitrite: NEGATIVE
PH: 5.5 (ref 5.0–8.0)
Protein, ur: NEGATIVE mg/dL
SPECIFIC GRAVITY, URINE: 1.017 (ref 1.005–1.030)

## 2015-06-26 LAB — CBC
HCT: 34.9 % — ABNORMAL LOW (ref 36.0–46.0)
Hemoglobin: 11.4 g/dL — ABNORMAL LOW (ref 12.0–15.0)
MCH: 26.5 pg (ref 26.0–34.0)
MCHC: 32.7 g/dL (ref 30.0–36.0)
MCV: 81.2 fL (ref 78.0–100.0)
PLATELETS: 323 10*3/uL (ref 150–400)
RBC: 4.3 MIL/uL (ref 3.87–5.11)
RDW: 15.3 % (ref 11.5–15.5)
WBC: 4.9 10*3/uL (ref 4.0–10.5)

## 2015-06-26 LAB — APTT: aPTT: 29 seconds (ref 24–37)

## 2015-06-26 LAB — SURGICAL PCR SCREEN
MRSA, PCR: NEGATIVE
Staphylococcus aureus: NEGATIVE

## 2015-06-26 LAB — PROTIME-INR
INR: 0.89 (ref 0.00–1.49)
PROTHROMBIN TIME: 12.3 s (ref 11.6–15.2)

## 2015-06-26 NOTE — Pre-Procedure Instructions (Addendum)
03-25-15 Pre-op clearance from Dr. Inda Merlin, on chart 06-26-14 Lexiscan Cardiolite from Dr. Wynonia Lawman, on chart  06-10-15 LOV with Dr. Thurman Coyer.  EKG on chart - EKG is unconfirmed. Spoke with Juliann Pulse at Dr. Thurman Coyer and requested confirmed EKG. 01-14-15 LOV from Dr. Jacelyn Pi, on chart  Pt glucose is 41. Called Dr. Anne Fu office and spoke with [place]. States she will notify the pt.

## 2015-06-26 NOTE — Progress Notes (Signed)
CRITICAL VALUE ALERT  Critical value received:  06-26-15   Date of notification:  06-26-15  Time of notification:  11:10am  Critical value read back: yes  Nurse who received alert:  Park Breed, RN  MD notified (1st page):  06-26-15  Time of first page:  11:15am  MD notified (2nd page):  Time of second page:  Responding MD:  Dian Situ, [place]  Time MD responded:  11:15am

## 2015-06-27 LAB — HEMOGLOBIN A1C
Hgb A1c MFr Bld: 7.2 % — ABNORMAL HIGH (ref 4.8–5.6)
Mean Plasma Glucose: 160 mg/dL

## 2015-07-06 ENCOUNTER — Inpatient Hospital Stay (HOSPITAL_COMMUNITY): Payer: Medicare Other | Admitting: Certified Registered Nurse Anesthetist

## 2015-07-06 ENCOUNTER — Encounter (HOSPITAL_COMMUNITY): Payer: Self-pay | Admitting: *Deleted

## 2015-07-06 ENCOUNTER — Inpatient Hospital Stay (HOSPITAL_COMMUNITY)
Admission: RE | Admit: 2015-07-06 | Discharge: 2015-07-08 | DRG: 470 | Disposition: A | Discharge status: Home Health | Payer: Medicare Other | Source: Ambulatory Visit | Attending: Orthopedic Surgery | Admitting: Orthopedic Surgery

## 2015-07-06 ENCOUNTER — Encounter (HOSPITAL_COMMUNITY)
Admission: RE | Disposition: A | Discharge status: Home Health | Payer: Self-pay | Source: Ambulatory Visit | Attending: Orthopedic Surgery

## 2015-07-06 DIAGNOSIS — E119 Type 2 diabetes mellitus without complications: Secondary | ICD-10-CM | POA: Diagnosis present

## 2015-07-06 DIAGNOSIS — F319 Bipolar disorder, unspecified: Secondary | ICD-10-CM | POA: Diagnosis present

## 2015-07-06 DIAGNOSIS — M170 Bilateral primary osteoarthritis of knee: Secondary | ICD-10-CM | POA: Diagnosis not present

## 2015-07-06 DIAGNOSIS — M179 Osteoarthritis of knee, unspecified: Secondary | ICD-10-CM | POA: Diagnosis present

## 2015-07-06 DIAGNOSIS — F419 Anxiety disorder, unspecified: Secondary | ICD-10-CM | POA: Diagnosis present

## 2015-07-06 DIAGNOSIS — M1710 Unilateral primary osteoarthritis, unspecified knee: Secondary | ICD-10-CM | POA: Diagnosis present

## 2015-07-06 DIAGNOSIS — Z79899 Other long term (current) drug therapy: Secondary | ICD-10-CM | POA: Diagnosis not present

## 2015-07-06 DIAGNOSIS — E785 Hyperlipidemia, unspecified: Secondary | ICD-10-CM | POA: Diagnosis present

## 2015-07-06 DIAGNOSIS — M25562 Pain in left knee: Secondary | ICD-10-CM | POA: Diagnosis not present

## 2015-07-06 DIAGNOSIS — Z01812 Encounter for preprocedural laboratory examination: Secondary | ICD-10-CM

## 2015-07-06 DIAGNOSIS — Z853 Personal history of malignant neoplasm of breast: Secondary | ICD-10-CM | POA: Diagnosis not present

## 2015-07-06 DIAGNOSIS — I10 Essential (primary) hypertension: Secondary | ICD-10-CM | POA: Diagnosis present

## 2015-07-06 DIAGNOSIS — Z7984 Long term (current) use of oral hypoglycemic drugs: Secondary | ICD-10-CM | POA: Diagnosis not present

## 2015-07-06 DIAGNOSIS — M1712 Unilateral primary osteoarthritis, left knee: Secondary | ICD-10-CM

## 2015-07-06 HISTORY — PX: TOTAL KNEE ARTHROPLASTY: SHX125

## 2015-07-06 LAB — GLUCOSE, CAPILLARY
GLUCOSE-CAPILLARY: 67 mg/dL (ref 65–99)
GLUCOSE-CAPILLARY: 68 mg/dL (ref 65–99)
GLUCOSE-CAPILLARY: 101 mg/dL — AB (ref 65–99)
GLUCOSE-CAPILLARY: 70 mg/dL (ref 65–99)
GLUCOSE-CAPILLARY: 261 mg/dL — AB (ref 65–99)
Glucose-Capillary: 104 mg/dL — ABNORMAL HIGH (ref 65–99)
Glucose-Capillary: 74 mg/dL (ref 65–99)
Glucose-Capillary: 115 mg/dL — ABNORMAL HIGH (ref 65–99)
Glucose-Capillary: 289 mg/dL — ABNORMAL HIGH (ref 65–99)

## 2015-07-06 LAB — TYPE AND SCREEN
ABO/RH(D): A POS
Antibody Screen: NEGATIVE

## 2015-07-06 SURGERY — ARTHROPLASTY, KNEE, TOTAL
Anesthesia: Spinal | Site: Knee | Laterality: Left

## 2015-07-06 MED ORDER — LACTATED RINGERS IV SOLN: INTRAVENOUS | Status: DC

## 2015-07-06 MED ORDER — INSULIN ASPART 100 UNIT/ML ~~LOC~~ SOLN
0.0000 [IU] | Freq: Three times a day (TID) | SUBCUTANEOUS | Status: DC
  Administered 2015-07-07 (×2): 5 [IU] via SUBCUTANEOUS
  Administered 2015-07-07: 3 [IU] via SUBCUTANEOUS
  Administered 2015-07-08: 11 [IU] via SUBCUTANEOUS
  Administered 2015-07-08: 5 [IU] via SUBCUTANEOUS

## 2015-07-06 MED ORDER — PROPOFOL 500 MG/50ML IV EMUL
INTRAVENOUS | Status: DC | PRN
  Administered 2015-07-06: 50 ug/kg/min via INTRAVENOUS

## 2015-07-06 MED ORDER — MIDAZOLAM HCL 2 MG/2ML IJ SOLN
INTRAMUSCULAR | Status: AC
  Filled 2015-07-06: qty 2

## 2015-07-06 MED ORDER — ONDANSETRON HCL 4 MG/2ML IJ SOLN
INTRAMUSCULAR | Status: AC
  Filled 2015-07-06: qty 2

## 2015-07-06 MED ORDER — SODIUM CHLORIDE 0.9 % IJ SOLN
INTRAMUSCULAR | Status: AC
  Filled 2015-07-06: qty 50

## 2015-07-06 MED ORDER — ONDANSETRON HCL 4 MG/2ML IJ SOLN: 4.0000 mg | Freq: Four times a day (QID) | INTRAMUSCULAR | Status: DC | PRN

## 2015-07-06 MED ORDER — LACTATED RINGERS IV SOLN
INTRAVENOUS | Status: DC | PRN
  Administered 2015-07-06 (×3): via INTRAVENOUS

## 2015-07-06 MED ORDER — DIPHENHYDRAMINE HCL 12.5 MG/5ML PO ELIX: 12.5000 mg | ORAL_SOLUTION | ORAL | Status: DC | PRN

## 2015-07-06 MED ORDER — FENTANYL CITRATE (PF) 100 MCG/2ML IJ SOLN
INTRAMUSCULAR | Status: AC
  Filled 2015-07-06: qty 2

## 2015-07-06 MED ORDER — PRAVASTATIN SODIUM 20 MG PO TABS
20.0000 mg | ORAL_TABLET | Freq: Every day | ORAL | Status: DC
  Administered 2015-07-07 – 2015-07-08 (×2): 20 mg via ORAL
  Filled 2015-07-06 (×3): qty 1

## 2015-07-06 MED ORDER — DEXTROSE 50 % IV SOLN
INTRAVENOUS | Status: AC
  Filled 2015-07-06: qty 50

## 2015-07-06 MED ORDER — DOCUSATE SODIUM 100 MG PO CAPS
100.0000 mg | ORAL_CAPSULE | Freq: Two times a day (BID) | ORAL | Status: DC
  Administered 2015-07-06 – 2015-07-08 (×4): 100 mg via ORAL

## 2015-07-06 MED ORDER — BUPIVACAINE HCL 0.25 % IJ SOLN
INTRAMUSCULAR | Status: DC | PRN
  Administered 2015-07-06: 20 mL

## 2015-07-06 MED ORDER — LINAGLIPTIN 5 MG PO TABS
5.0000 mg | ORAL_TABLET | Freq: Every day | ORAL | Status: DC
  Administered 2015-07-06 – 2015-07-08 (×3): 5 mg via ORAL
  Filled 2015-07-06 (×3): qty 1

## 2015-07-06 MED ORDER — DEXAMETHASONE SODIUM PHOSPHATE 10 MG/ML IJ SOLN
10.0000 mg | Freq: Once | INTRAMUSCULAR | Status: AC
  Administered 2015-07-07: 10 mg via INTRAVENOUS
  Filled 2015-07-06: qty 1

## 2015-07-06 MED ORDER — SODIUM CHLORIDE 0.9 % IJ SOLN
INTRAMUSCULAR | Status: DC | PRN
  Administered 2015-07-06: 30 mL

## 2015-07-06 MED ORDER — BUPIVACAINE LIPOSOME 1.3 % IJ SUSP
INTRAMUSCULAR | Status: DC | PRN
  Administered 2015-07-06: 20 mL

## 2015-07-06 MED ORDER — ACETAMINOPHEN 500 MG PO TABS
1000.0000 mg | ORAL_TABLET | Freq: Four times a day (QID) | ORAL | Status: AC
  Administered 2015-07-06 – 2015-07-07 (×4): 1000 mg via ORAL
  Filled 2015-07-06 (×4): qty 2

## 2015-07-06 MED ORDER — CHLORHEXIDINE GLUCONATE 4 % EX LIQD: 60.0000 mL | Freq: Once | CUTANEOUS | Status: DC

## 2015-07-06 MED ORDER — PHENOL 1.4 % MT LIQD
1.0000 | OROMUCOSAL | Status: DC | PRN
Start: 2015-07-06 — End: 2015-07-08
  Filled 2015-07-06: qty 177

## 2015-07-06 MED ORDER — STERILE WATER FOR IRRIGATION IR SOLN
Status: DC | PRN
  Administered 2015-07-06: 1500 mL

## 2015-07-06 MED ORDER — SODIUM CHLORIDE 0.9 % IV SOLN
INTRAVENOUS | Status: DC
  Administered 2015-07-06: 11:00:00 via INTRAVENOUS

## 2015-07-06 MED ORDER — POLYETHYLENE GLYCOL 3350 17 G PO PACK: 17.0000 g | PACK | Freq: Every day | ORAL | Status: DC | PRN

## 2015-07-06 MED ORDER — PROPOFOL 10 MG/ML IV BOLUS
INTRAVENOUS | Status: AC
  Filled 2015-07-06: qty 20

## 2015-07-06 MED ORDER — MORPHINE SULFATE (PF) 2 MG/ML IV SOLN
1.0000 mg | INTRAVENOUS | Status: DC | PRN
Start: 2015-07-06 — End: 2015-07-08
  Administered 2015-07-06: 1 mg via INTRAVENOUS
  Filled 2015-07-06: qty 1

## 2015-07-06 MED ORDER — SODIUM CHLORIDE 0.9 % IJ SOLN
INTRAMUSCULAR | Status: AC
Start: 2015-07-06 — End: 2015-07-06
  Filled 2015-07-06: qty 10

## 2015-07-06 MED ORDER — ONDANSETRON HCL 4 MG PO TABS: 4.0000 mg | ORAL_TABLET | Freq: Four times a day (QID) | ORAL | Status: DC | PRN

## 2015-07-06 MED ORDER — CEFAZOLIN SODIUM-DEXTROSE 2-3 GM-% IV SOLR
2.0000 g | INTRAVENOUS | Status: AC
  Administered 2015-07-06: 2 g via INTRAVENOUS

## 2015-07-06 MED ORDER — DEXTROSE 50 % IV SOLN
INTRAVENOUS | Status: DC | PRN
  Administered 2015-07-06: 25 mL via INTRAVENOUS

## 2015-07-06 MED ORDER — CLONAZEPAM 1 MG PO TABS
1.0000 mg | ORAL_TABLET | Freq: Every day | ORAL | Status: DC
  Administered 2015-07-06 – 2015-07-07 (×2): 1 mg via ORAL
  Filled 2015-07-06 (×2): qty 1

## 2015-07-06 MED ORDER — HYDROMORPHONE HCL 1 MG/ML IJ SOLN: 0.2500 mg | INTRAMUSCULAR | Status: DC | PRN

## 2015-07-06 MED ORDER — DEXTROSE 50 % IV SOLN
25.0000 mL | Freq: Once | INTRAVENOUS | Status: AC
  Administered 2015-07-06: 25 mL via INTRAVENOUS
  Filled 2015-07-06: qty 50

## 2015-07-06 MED ORDER — DEXTROSE 50 % IV SOLN
25.0000 mL | Freq: Once | INTRAVENOUS | Status: AC
  Administered 2015-07-06: 25 mL via INTRAVENOUS

## 2015-07-06 MED ORDER — DEXAMETHASONE SODIUM PHOSPHATE 10 MG/ML IJ SOLN
INTRAMUSCULAR | Status: AC
  Filled 2015-07-06: qty 1

## 2015-07-06 MED ORDER — BUPIVACAINE IN DEXTROSE 0.75-8.25 % IT SOLN
INTRATHECAL | Status: DC | PRN
  Administered 2015-07-06: 1.8 mL via INTRATHECAL

## 2015-07-06 MED ORDER — CEFAZOLIN SODIUM-DEXTROSE 2-3 GM-% IV SOLR
2.0000 g | Freq: Four times a day (QID) | INTRAVENOUS | Status: AC
  Administered 2015-07-06 (×2): 2 g via INTRAVENOUS
  Filled 2015-07-06 (×2): qty 50

## 2015-07-06 MED ORDER — ACETAMINOPHEN 10 MG/ML IV SOLN
1000.0000 mg | Freq: Once | INTRAVENOUS | Status: AC
  Administered 2015-07-06: 1000 mg via INTRAVENOUS

## 2015-07-06 MED ORDER — METFORMIN HCL ER 500 MG PO TB24
1000.0000 mg | ORAL_TABLET | Freq: Two times a day (BID) | ORAL | Status: DC
  Filled 2015-07-06 (×3): qty 2

## 2015-07-06 MED ORDER — METOCLOPRAMIDE HCL 10 MG PO TABS: 5.0000 mg | ORAL_TABLET | Freq: Three times a day (TID) | ORAL | Status: DC | PRN

## 2015-07-06 MED ORDER — SODIUM CHLORIDE 0.9 % IR SOLN
Status: DC | PRN
  Administered 2015-07-06: 1000 mL

## 2015-07-06 MED ORDER — TRANEXAMIC ACID 1000 MG/10ML IV SOLN
2000.0000 mg | Freq: Once | INTRAVENOUS | Status: DC
  Filled 2015-07-06: qty 20

## 2015-07-06 MED ORDER — METHOCARBAMOL 1000 MG/10ML IJ SOLN
500.0000 mg | Freq: Four times a day (QID) | INTRAVENOUS | Status: DC | PRN
  Administered 2015-07-06: 500 mg via INTRAVENOUS
  Filled 2015-07-06 (×2): qty 5

## 2015-07-06 MED ORDER — DEXAMETHASONE SODIUM PHOSPHATE 10 MG/ML IJ SOLN
10.0000 mg | Freq: Once | INTRAMUSCULAR | Status: AC
  Administered 2015-07-06: 10 mg via INTRAVENOUS

## 2015-07-06 MED ORDER — ACETAMINOPHEN 10 MG/ML IV SOLN
INTRAVENOUS | Status: AC
  Filled 2015-07-06: qty 100

## 2015-07-06 MED ORDER — CEFAZOLIN SODIUM-DEXTROSE 2-3 GM-% IV SOLR
INTRAVENOUS | Status: AC
  Filled 2015-07-06: qty 50

## 2015-07-06 MED ORDER — TRANEXAMIC ACID 1000 MG/10ML IV SOLN
2000.0000 mg | INTRAVENOUS | Status: DC | PRN
  Administered 2015-07-06: 2000 mg via TOPICAL

## 2015-07-06 MED ORDER — FLEET ENEMA 7-19 GM/118ML RE ENEM: 1.0000 | ENEMA | Freq: Once | RECTAL | Status: DC | PRN

## 2015-07-06 MED ORDER — METHOCARBAMOL 500 MG PO TABS: 500.0000 mg | ORAL_TABLET | Freq: Four times a day (QID) | ORAL | Status: DC | PRN

## 2015-07-06 MED ORDER — FENTANYL CITRATE (PF) 100 MCG/2ML IJ SOLN
INTRAMUSCULAR | Status: DC | PRN
  Administered 2015-07-06 (×2): 50 ug via INTRAVENOUS

## 2015-07-06 MED ORDER — TRAMADOL HCL 50 MG PO TABS
50.0000 mg | ORAL_TABLET | Freq: Four times a day (QID) | ORAL | Status: DC | PRN
  Administered 2015-07-07 – 2015-07-08 (×3): 50 mg via ORAL
  Filled 2015-07-06 (×3): qty 1

## 2015-07-06 MED ORDER — ARIPIPRAZOLE 15 MG PO TABS
30.0000 mg | ORAL_TABLET | Freq: Every day | ORAL | Status: DC
  Administered 2015-07-06 – 2015-07-07 (×2): 30 mg via ORAL
  Filled 2015-07-06 (×3): qty 2

## 2015-07-06 MED ORDER — OXYCODONE HCL 5 MG PO TABS
5.0000 mg | ORAL_TABLET | ORAL | Status: DC | PRN
  Administered 2015-07-06: 5 mg via ORAL
  Filled 2015-07-06: qty 1

## 2015-07-06 MED ORDER — ACETAMINOPHEN 650 MG RE SUPP: 650.0000 mg | Freq: Four times a day (QID) | RECTAL | Status: DC | PRN

## 2015-07-06 MED ORDER — PROPOFOL 10 MG/ML IV BOLUS
INTRAVENOUS | Status: DC | PRN
  Administered 2015-07-06 (×3): 10 mg via INTRAVENOUS

## 2015-07-06 MED ORDER — BUPIVACAINE HCL (PF) 0.25 % IJ SOLN
INTRAMUSCULAR | Status: AC
Start: 2015-07-06 — End: 2015-07-06
  Filled 2015-07-06: qty 30

## 2015-07-06 MED ORDER — EPHEDRINE SULFATE 50 MG/ML IJ SOLN
INTRAMUSCULAR | Status: DC | PRN
  Administered 2015-07-06 (×2): 10 mg via INTRAVENOUS

## 2015-07-06 MED ORDER — PHENYLEPHRINE 40 MCG/ML (10ML) SYRINGE FOR IV PUSH (FOR BLOOD PRESSURE SUPPORT)
PREFILLED_SYRINGE | INTRAVENOUS | Status: AC
  Filled 2015-07-06: qty 10

## 2015-07-06 MED ORDER — RAMIPRIL 5 MG PO CAPS
5.0000 mg | ORAL_CAPSULE | Freq: Every day | ORAL | Status: DC
  Administered 2015-07-06: 5 mg via ORAL
  Filled 2015-07-06 (×3): qty 1

## 2015-07-06 MED ORDER — METOCLOPRAMIDE HCL 5 MG/ML IJ SOLN: 5.0000 mg | Freq: Three times a day (TID) | INTRAMUSCULAR | Status: DC | PRN

## 2015-07-06 MED ORDER — PROPOFOL 10 MG/ML IV BOLUS
INTRAVENOUS | Status: AC
  Filled 2015-07-06: qty 40

## 2015-07-06 MED ORDER — SODIUM CHLORIDE 0.9 % IV SOLN
INTRAVENOUS | Status: DC
  Administered 2015-07-06: 19:00:00 via INTRAVENOUS

## 2015-07-06 MED ORDER — MENTHOL 3 MG MT LOZG: 1.0000 | LOZENGE | OROMUCOSAL | Status: DC | PRN

## 2015-07-06 MED ORDER — BISACODYL 10 MG RE SUPP: 10.0000 mg | Freq: Every day | RECTAL | Status: DC | PRN

## 2015-07-06 MED ORDER — EPHEDRINE SULFATE 50 MG/ML IJ SOLN
INTRAMUSCULAR | Status: AC
  Filled 2015-07-06: qty 1

## 2015-07-06 MED ORDER — ACETAMINOPHEN 325 MG PO TABS
650.0000 mg | ORAL_TABLET | Freq: Four times a day (QID) | ORAL | Status: DC | PRN
  Administered 2015-07-07: 650 mg via ORAL
  Filled 2015-07-06 (×2): qty 2

## 2015-07-06 MED ORDER — RIVAROXABAN 10 MG PO TABS
10.0000 mg | ORAL_TABLET | Freq: Every day | ORAL | Status: DC
  Administered 2015-07-07 – 2015-07-08 (×2): 10 mg via ORAL
  Filled 2015-07-06 (×3): qty 1

## 2015-07-06 MED ORDER — ONDANSETRON HCL 4 MG/2ML IJ SOLN
INTRAMUSCULAR | Status: DC | PRN
  Administered 2015-07-06: 4 mg via INTRAVENOUS

## 2015-07-06 MED ORDER — MIDAZOLAM HCL 5 MG/5ML IJ SOLN
INTRAMUSCULAR | Status: DC | PRN
  Administered 2015-07-06: 2 mg via INTRAVENOUS

## 2015-07-06 MED ORDER — BUPIVACAINE LIPOSOME 1.3 % IJ SUSP
20.0000 mL | Freq: Once | INTRAMUSCULAR | Status: DC
  Filled 2015-07-06: qty 20

## 2015-07-06 MED ORDER — 0.9 % SODIUM CHLORIDE (POUR BTL) OPTIME
TOPICAL | Status: DC | PRN
  Administered 2015-07-06: 1000 mL

## 2015-07-06 SURGICAL SUPPLY — 49 items
BAG DECANTER FOR FLEXI CONT (MISCELLANEOUS) ×3 IMPLANT
BAG SPEC THK2 15X12 ZIP CLS (MISCELLANEOUS) ×1
BAG ZIPLOCK 12X15 (MISCELLANEOUS) ×3 IMPLANT
BANDAGE ACE 6X5 VEL STRL LF (GAUZE/BANDAGES/DRESSINGS) ×3 IMPLANT
BLADE SAG 18X100X1.27 (BLADE) ×3 IMPLANT
BLADE SAW SGTL 11.0X1.19X90.0M (BLADE) ×3 IMPLANT
BOWL SMART MIX CTS (DISPOSABLE) ×3 IMPLANT
CAPT KNEE TOTAL 3 ATTUNE ×2 IMPLANT
CEMENT HV SMART SET (Cement) ×6 IMPLANT
CLOSURE WOUND 1/2 X4 (GAUZE/BANDAGES/DRESSINGS) ×2
CLOTH BEACON ORANGE TIMEOUT ST (SAFETY) ×3 IMPLANT
CUFF TOURN SGL QUICK 34 (TOURNIQUET CUFF) ×3
CUFF TRNQT CYL 34X4X40X1 (TOURNIQUET CUFF) ×1 IMPLANT
DECANTER SPIKE VIAL GLASS SM (MISCELLANEOUS) ×3 IMPLANT
DRAPE U-SHAPE 47X51 STRL (DRAPES) ×3 IMPLANT
DRSG ADAPTIC 3X8 NADH LF (GAUZE/BANDAGES/DRESSINGS) ×3 IMPLANT
DRSG PAD ABDOMINAL 8X10 ST (GAUZE/BANDAGES/DRESSINGS) ×3 IMPLANT
DURAPREP 26ML APPLICATOR (WOUND CARE) ×3 IMPLANT
ELECT REM PT RETURN 9FT ADLT (ELECTROSURGICAL) ×3
ELECTRODE REM PT RTRN 9FT ADLT (ELECTROSURGICAL) ×1 IMPLANT
EVACUATOR 1/8 PVC DRAIN (DRAIN) ×3 IMPLANT
GAUZE SPONGE 4X4 12PLY STRL (GAUZE/BANDAGES/DRESSINGS) ×3 IMPLANT
GLOVE BIOGEL PI IND STRL 6.5 (GLOVE) IMPLANT
GLOVE BIOGEL PI IND STRL 7.5 (GLOVE) IMPLANT
GLOVE BIOGEL PI INDICATOR 6.5 (GLOVE) ×4
GLOVE BIOGEL PI INDICATOR 7.5 (GLOVE) ×4
GLOVE SURG SS PI 6.5 STRL IVOR (GLOVE) ×4 IMPLANT
GLOVE SURG SS PI 7.5 STRL IVOR (GLOVE) ×2 IMPLANT
GLOVE SURG SS PI 8.0 STRL IVOR (GLOVE) ×2 IMPLANT
GOWN STRL REUS W/TWL LRG LVL3 (GOWN DISPOSABLE) ×5 IMPLANT
GOWN STRL REUS W/TWL XL LVL3 (GOWN DISPOSABLE) ×4 IMPLANT
HANDPIECE INTERPULSE COAX TIP (DISPOSABLE) ×3
IMMOBILIZER KNEE 20 (SOFTGOODS) ×3
IMMOBILIZER KNEE 20 THIGH 36 (SOFTGOODS) ×1 IMPLANT
MANIFOLD NEPTUNE II (INSTRUMENTS) ×3 IMPLANT
PACK TOTAL KNEE CUSTOM (KITS) ×3 IMPLANT
PAD ABD 8X10 STRL (GAUZE/BANDAGES/DRESSINGS) ×2 IMPLANT
PADDING CAST COTTON 6X4 STRL (CAST SUPPLIES) ×7 IMPLANT
POSITIONER SURGICAL ARM (MISCELLANEOUS) ×3 IMPLANT
SET HNDPC FAN SPRY TIP SCT (DISPOSABLE) ×1 IMPLANT
STRIP CLOSURE SKIN 1/2X4 (GAUZE/BANDAGES/DRESSINGS) ×4 IMPLANT
SUT MNCRL AB 4-0 PS2 18 (SUTURE) ×3 IMPLANT
SUT VIC AB 2-0 CT1 27 (SUTURE) ×9
SUT VIC AB 2-0 CT1 TAPERPNT 27 (SUTURE) ×3 IMPLANT
SUT VLOC 180 0 24IN GS25 (SUTURE) ×3 IMPLANT
SYR 50ML LL SCALE MARK (SYRINGE) ×3 IMPLANT
TRAY FOLEY CATH SILVER 16FR LF (SET/KITS/TRAYS/PACK) ×2 IMPLANT
WRAP KNEE MAXI GEL POST OP (GAUZE/BANDAGES/DRESSINGS) ×3 IMPLANT
YANKAUER SUCT BULB TIP 10FT TU (MISCELLANEOUS) ×3 IMPLANT

## 2015-07-06 NOTE — Transfer of Care (Signed)
Immediate Anesthesia Transfer of Care Note  Patient: [name]  Procedure(s) Performed: Procedure(s): LEFT TOTAL KNEE ARTHROPLASTY (Left)  Patient Location: PACU  Anesthesia Type:Spinal  Level of Consciousness:  sedated, patient cooperative and responds to stimulation  Airway & Oxygen Therapy:Patient Spontanous Breathing and Patient connected to face mask oxgen  Post-op Assessment:  Report given to PACU RN and Post -op Vital signs reviewed and stable  Post vital signs:  Reviewed and stable, spinal L4  Last Vitals:  Filed Vitals:   07/06/15 0938  BP: 122/59  Pulse: 64  Temp: 36.3 C  Resp: 16    Complications: No apparent anesthesia complications

## 2015-07-06 NOTE — Progress Notes (Signed)
Patient presents today for total left knee replacement. She took full dose of 14 units of insulin this AM instead of 7 units.  RN started IV of NS and gave 25 ml of D5O IVP. Updated note to OR holding.

## 2015-07-06 NOTE — Anesthesia Preprocedure Evaluation (Signed)
Anesthesia Evaluation  Patient identified by MRN, date of birth, ID band Patient awake    Reviewed: Allergy & Precautions, H&P , NPO status , Patient's Chart, lab work & pertinent test results, reviewed documented beta blocker date and time   Airway Mallampati: II  TM Distance: >3 FB Neck ROM: Full    Dental no notable dental hx. (+) Teeth Intact, Dental Advisory Given   Pulmonary neg pulmonary ROS,    Pulmonary exam normal breath sounds clear to auscultation       Cardiovascular Exercise Tolerance: Good hypertension, Pt. on medications Normal cardiovascular exam Rhythm:Regular Rate:Normal     Neuro/Psych Anxiety Depression Bipolar Disorder negative neurological ROS  negative psych ROS   GI/Hepatic negative GI ROS, Neg liver ROS,   Endo/Other  diabetes, Well Controlled, Type 2, Insulin Dependent, Oral Hypoglycemic Agents  Renal/GU negative Renal ROS  negative genitourinary   Musculoskeletal  (+) Arthritis , Osteoarthritis,    Abdominal   Peds  Hematology negative hematology ROS (+)   Anesthesia Other Findings   Reproductive/Obstetrics negative OB ROS                             Anesthesia Physical Anesthesia Plan  ASA: III  Anesthesia Plan: Spinal   Post-op Pain Management:    Induction:   Airway Management Planned:   Additional Equipment:   Intra-op Plan:   Post-operative Plan:   Informed Consent: I have reviewed the patients History and Physical, chart, labs and discussed the procedure including the risks, benefits and alternatives for the proposed anesthesia with the patient or authorized representative who has indicated his/her understanding and acceptance.   Dental Advisory Given  Plan Discussed with: CRNA and Surgeon  Anesthesia Plan Comments:         Anesthesia Quick Evaluation

## 2015-07-06 NOTE — Op Note (Signed)
Pre-operative diagnosis- Osteoarthritis  Left knee(s)  Post-operative diagnosis- Osteoarthritis Left knee(s)  Procedure-  Left Total Knee Arthroplasty  Surgeon- Dione Plover. Bynum Mccullars, MD  Assistant- Arlee Muslim, PA-C   Anesthesia-  Spinal  EBL-* No blood loss amount entered *   Drains Hemovac  Tourniquet time-  Total Tourniquet Time Documented: Thigh (Left) - 28 minutes Total: Thigh (Left) - 28 minutes     Complications- None  Condition-PACU - hemodynamically stable.   Brief Clinical Note  [name] is a 70 y.o. year old female with end stage OA of her left knee with progressively worsening pain and dysfunction. She has constant pain, with activity and at rest and significant functional deficits with difficulties even with ADLs. She has had extensive non-op management including analgesics, injections of cortisone and viscosupplements, and home exercise program, but remains in significant pain with significant dysfunction. Radiographs show bone on bone arthritis patellofemoral with medial narrowing. She presents now for left Total Knee Arthroplasty.    Procedure in detail---   The patient is brought into the operating room and positioned supine on the operating table. After successful administration of  Spinal,   a tourniquet is placed high on the  Left  thigh(s) and the lower extremity is prepped and draped in the usual sterile fashion. Time out is performed by the operating team and then the Left lower extremity is wrapped in Esmarch, knee flexed and the tourniquet inflated to 300 mmHg.       A midline incision is made with a ten blade through the subcutaneous tissue to the level of the extensor mechanism. A fresh blade is used to make a medial parapatellar arthrotomy. Soft tissue over the proximal medial tibia is subperiosteally elevated to the joint line with a knife and into the semimembranosus bursa with a Cobb elevator. Soft tissue over the proximal lateral tibia is elevated  with attention being paid to avoiding the patellar tendon on the tibial tubercle. The patella is everted, knee flexed 90 degrees and the ACL and PCL are removed. Findings are bone on bone lateral and patellofemoral with large global osteophytes.        The drill is used to create a starting hole in the distal femur and the canal is thoroughly irrigated with sterile saline to remove the fatty contents. The 5 degree Left  valgus alignment guide is placed into the femoral canal and the distal femoral cutting block is pinned to remove 9 mm off the distal femur. Resection is made with an oscillating saw.      The tibia is subluxed forward and the menisci are removed. The extramedullary alignment guide is placed referencing proximally at the medial aspect of the tibial tubercle and distally along the second metatarsal axis and tibial crest. The block is pinned to remove 66mm off the more deficient lateral  side. Resection is made with an oscillating saw. Size 5is the most appropriate size for the tibia and the proximal tibia is prepared with the modular drill and keel punch for that size.      The femoral sizing guide is placed and size 5 is most appropriate. Rotation is marked off the epicondylar axis and confirmed by creating a rectangular flexion gap at 90 degrees. The size 5 cutting block is pinned in this rotation and the anterior, posterior and chamfer cuts are made with the oscillating saw. The intercondylar block is then placed and that cut is made.      Trial size 5 tibial component,  trial size 5 posterior stabilized femur and a 6  mm posterior stabilized rotating platform insert trial is placed. Full extension is achieved with excellent varus/valgus and anterior/posterior balance throughout full range of motion. The patella is everted and thickness measured to be 22  mm. Free hand resection is taken to 12 mm, a 35 template is placed, lug holes are drilled, trial patella is placed, and it tracks normally.  Osteophytes are removed off the posterior femur with the trial in place. All trials are removed and the cut bone surfaces prepared with pulsatile lavage. Cement is mixed and once ready for implantation, the size 5 tibial implant, size  5 posterior stabilized femoral component, and the size 35 patella are cemented in place and the patella is held with the clamp. The trial insert is placed and the knee held in full extension. The Exparel (20 ml mixed with 30 ml saline) and .25% Bupivicaine, are injected into the extensor mechanism, posterior capsule, medial and lateral gutters and subcutaneous tissues.  All extruded cement is removed and once the cement is hard the permanent 6 mm posterior stabilized rotating platform insert is placed into the tibial tray.      The wound is copiously irrigated with saline solution and the extensor mechanism closed over a hemovac drain with #1 V-loc suture. The tourniquet is released for a total tourniquet time of 27  minutes. Flexion against gravity is 140 degrees and the patella tracks normally. Subcutaneous tissue is closed with 2.0 vicryl and subcuticular with running 4.0 Monocryl. The incision is cleaned and dried and steri-strips and a bulky sterile dressing are applied. The limb is placed into a knee immobilizer and the patient is awakened and transported to recovery in stable condition.      Please note that a surgical assistant was a medical necessity for this procedure in order to perform it in a safe and expeditious manner. Surgical assistant was necessary to retract the ligaments and vital neurovascular structures to prevent injury to them and also necessary for proper positioning of the limb to allow for anatomic placement of the prosthesis.   Dione Plover Arika Mainer, MD    07/06/2015, 1:13 PM

## 2015-07-06 NOTE — Progress Notes (Signed)
Utilization review completed.  

## 2015-07-06 NOTE — Interval H&P Note (Signed)
History and Physical Interval Note:  07/06/2015 11:23 AM  [name]  has presented today for surgery, with the diagnosis of left knee osteoarthritis  The various methods of treatment have been discussed with the patient and family. After consideration of risks, benefits and other options for treatment, the patient has consented to  Procedure(s): LEFT TOTAL KNEE ARTHROPLASTY (Left) as a surgical intervention .  The patient's history has been reviewed, patient examined, no change in status, stable for surgery.  I have reviewed the patient's chart and labs.  Questions were answered to the patient's satisfaction.     [name]

## 2015-07-06 NOTE — Anesthesia Procedure Notes (Signed)
Spinal Patient location during procedure: OR Start time: 07/06/2015 12:12 PM End time: 07/06/2015 12:17 PM Staffing Anesthesiologist: Rod Mae Resident/CRNA: Darlys Gales R Performed by: resident/CRNA  Preanesthetic Checklist Completed: patient identified, site marked, surgical consent, pre-op evaluation, timeout performed, IV checked, risks and benefits discussed and monitors and equipment checked Spinal Block Patient position: sitting Prep: Betadine Patient monitoring: heart rate, continuous pulse ox and blood pressure Approach: midline Location: L3-4 Injection technique: single-shot Needle Needle type: Spinocan  Needle gauge: 22 G Needle length: 9 cm Needle insertion depth: 7.5 cm Additional Notes Expiration date of kit checked and confirmed. Patient tolerated procedure well, without complications. Lot [id number] Exp 2016-09-05

## 2015-07-06 NOTE — Anesthesia Postprocedure Evaluation (Signed)
Anesthesia Post Note  Patient: [name]  Procedure(s) Performed: Procedure(s) (LRB): LEFT TOTAL KNEE ARTHROPLASTY (Left)  Patient location during evaluation: PACU Anesthesia Type: Spinal Level of consciousness: awake and alert Pain management: pain level controlled Vital Signs Assessment: post-procedure vital signs reviewed and stable Respiratory status: spontaneous breathing, nonlabored ventilation, respiratory function stable and patient connected to nasal cannula oxygen Cardiovascular status: blood pressure returned to baseline and stable Postop Assessment: no signs of nausea or vomiting Anesthetic complications: no    Last Vitals:  Filed Vitals:   07/06/15 1655 07/06/15 1749  BP: 133/60 127/58  Pulse: 73 70  Temp: 37.2 C 37.1 C  Resp: 16 20    Last Pain:  Filed Vitals:   07/06/15 1848  PainSc: 1                  Kinan Safley L

## 2015-07-07 ENCOUNTER — Encounter (HOSPITAL_COMMUNITY): Payer: Self-pay | Admitting: Orthopedic Surgery

## 2015-07-07 LAB — CBC
HEMATOCRIT: 29.3 % — AB (ref 36.0–46.0)
HEMOGLOBIN: 9.5 g/dL — AB (ref 12.0–15.0)
MCH: 26.5 pg (ref 26.0–34.0)
MCHC: 32.4 g/dL (ref 30.0–36.0)
MCV: 81.6 fL (ref 78.0–100.0)
PLATELETS: 270 10*3/uL (ref 150–400)
RBC: 3.59 MIL/uL — AB (ref 3.87–5.11)
RDW: 15.1 % (ref 11.5–15.5)
WBC: 7.1 10*3/uL (ref 4.0–10.5)

## 2015-07-07 LAB — BASIC METABOLIC PANEL
ANION GAP: 5 (ref 5–15)
BUN: 9 mg/dL (ref 6–20)
CHLORIDE: 100 mmol/L — AB (ref 101–111)
CO2: 28 mmol/L (ref 22–32)
Calcium: 8.7 mg/dL — ABNORMAL LOW (ref 8.9–10.3)
Creatinine, Ser: 0.65 mg/dL (ref 0.44–1.00)
GFR calc Af Amer: >60 mL/min (ref >60)
GLUCOSE: 220 mg/dL — AB (ref 65–99)
POTASSIUM: 4.9 mmol/L (ref 3.5–5.1)
Sodium: 133 mmol/L — ABNORMAL LOW (ref 135–145)

## 2015-07-07 LAB — GLUCOSE, CAPILLARY
GLUCOSE-CAPILLARY: 216 mg/dL — AB (ref 65–99)
Glucose-Capillary: 187 mg/dL — ABNORMAL HIGH (ref 65–99)
Glucose-Capillary: 232 mg/dL — ABNORMAL HIGH (ref 65–99)
Glucose-Capillary: 307 mg/dL — ABNORMAL HIGH (ref 65–99)

## 2015-07-07 MED ORDER — INSULIN ASPART 100 UNIT/ML ~~LOC~~ SOLN: 0.0000 [IU] | Freq: Three times a day (TID) | SUBCUTANEOUS | Status: DC

## 2015-07-07 MED ORDER — OXYCODONE HCL 5 MG PO TABS: 5.0000 mg | ORAL_TABLET | ORAL | Status: DC | PRN

## 2015-07-07 MED ORDER — POLYSACCHARIDE IRON COMPLEX 150 MG PO CAPS
150.0000 mg | ORAL_CAPSULE | Freq: Every day | ORAL | Status: DC
  Administered 2015-07-07 – 2015-07-08 (×2): 150 mg via ORAL
  Filled 2015-07-07 (×2): qty 1

## 2015-07-07 MED ORDER — METHOCARBAMOL 500 MG PO TABS: 500.0000 mg | ORAL_TABLET | Freq: Four times a day (QID) | ORAL | Status: DC | PRN

## 2015-07-07 MED ORDER — SODIUM CHLORIDE 0.9 % IV BOLUS (SEPSIS)
250.0000 mL | Freq: Once | INTRAVENOUS | Status: AC
  Administered 2015-07-07: 250 mL via INTRAVENOUS

## 2015-07-07 MED ORDER — METFORMIN HCL ER 500 MG PO TB24
1000.0000 mg | ORAL_TABLET | Freq: Once | ORAL | Status: AC
Start: 2015-07-07 — End: 2015-07-07
  Administered 2015-07-07: 1000 mg via ORAL
  Filled 2015-07-07: qty 2

## 2015-07-07 MED ORDER — POLYSACCHARIDE IRON COMPLEX 150 MG PO CAPS: 150.0000 mg | ORAL_CAPSULE | Freq: Every day | ORAL | Status: DC

## 2015-07-07 MED ORDER — INSULIN ASPART 100 UNIT/ML ~~LOC~~ SOLN
0.0000 [IU] | Freq: Every day | SUBCUTANEOUS | Status: DC
  Administered 2015-07-07: 4 [IU] via SUBCUTANEOUS

## 2015-07-07 MED ORDER — RIVAROXABAN 10 MG PO TABS: 10.0000 mg | ORAL_TABLET | Freq: Every day | ORAL | Status: DC

## 2015-07-07 MED ORDER — TRAMADOL HCL 50 MG PO TABS: 50.0000 mg | ORAL_TABLET | Freq: Four times a day (QID) | ORAL | Status: DC | PRN

## 2015-07-07 NOTE — Progress Notes (Signed)
Inpatient Diabetes Program Recommendations  AACE/ADA: New Consensus Statement on Inpatient Glycemic Control (2015)  Target Ranges:  Prepandial:   less than 140 mg/dL      Peak postprandial:   less than 180 mg/dL (1-2 hours)      Critically ill patients:  140 - 180 mg/dL   Results for [name], [name] (MRN [id number]) as of 07/07/2015 13:17  Ref. Range 07/07/2015 07:33 07/07/2015 12:05  Glucose-Capillary Latest Ref Range: 65-99 mg/dL 187 (H) 232 (H)   Admit L Knee.   History: DM  Home DM Meds: Humalog 75/25 insulin 12-14 units bidwc        Metformin 1000 mg bid        Januvia 100 mg daily  Current Insulin Orders: Novolog Moderate Correction Scale/ SSI (0-15 units) TID AC      Tradjenta 5 mg daily     MD- Please consider resuming patient's home dose of pre-mixed insulin.  Hospital does not carry Humalog 75/25 insulin so pharmacy will substitute Novolog 70/30 insulin.    --Will follow patient during hospitalization--  Wyn Quaker RN, MSN, CDE Diabetes Coordinator Inpatient Glycemic Control Team Team Pager: [phone] (8a-5p)

## 2015-07-07 NOTE — Care Management Note (Signed)
Case Management Note  Patient Details  Name: [name] MRN: [id number] Date of Birth: [birth year]  Subjective/Objective:                  LEFT TOTAL KNEE ARTHROPLASTY (Left) Action/Plan: Discharge planning Expected Discharge Date:  07/08/15               Expected Discharge Plan:  [place]  In-House Referral:     Discharge planning Services  CM Consult  Post Acute Care Choice:    Choice offered to:  Patient  DME Arranged:  Walker rolling DME Agency:  [place] [facility] Arranged:  PT [facility] Agency:  [place]  Status of Service:  Completed, signed off  Medicare Important Message Given:    Date Medicare IM Given:    Medicare IM give by:    Date Additional Medicare IM Given:    Additional Medicare Important Message give by:     If discussed at [place] of Stay Meetings, dates discussed:    Additional Comments: CM met with pt in room to offer choice of home health agency.  Pt chooses [facility] to render HHPT.   Referral called to [employer], Tim.  CM called [facility] DME rep, Lecretia to please deliver the rolling walker to room prior to discharge.  No other CM needs were communicated. Dellie Catholic, RN 07/07/2015, 1:55 PM

## 2015-07-07 NOTE — Progress Notes (Signed)
[facility] Delivered rw to hospital room   Linward Headland 07/07/2015, 12:25 PM

## 2015-07-07 NOTE — Evaluation (Signed)
Occupational Therapy Evaluation Patient Details Name: [name] MRN: [id number] DOB: 27-Aug-1945 Today's Date: 07/07/2015    History of Present Illness 70 yo female s/p L TKA 07/06/15. R TKA 2016   Clinical Impression   Patient presenting with decreased ADL and functional mobility independence secondary to above. Patient independent PTA. Patient currently functioning at an overall supervision to min assist level. Patient will benefit from acute OT to increase overall independence in the areas of ADLs, functional mobility, and overall safety in order to safely discharge home with husband.   Upon entering room, pt found standing at sink with NT. Pt reports feeling a little "off". BP checked=106/46. Pt willing and eager to work with therapies.     Follow Up Recommendations  No OT follow up;Supervision - Intermittent    Equipment Recommendations  None recommended by OT    Recommendations for Other Services  None at this time     Precautions / Restrictions Precautions Precautions: Fall;Knee Required Braces or Orthoses: Knee Immobilizer - Left Knee Immobilizer - Left: Discontinue once straight leg raise with < 10 degree lag Restrictions Weight Bearing Restrictions: Yes LLE Weight Bearing: Weight bearing as tolerated    Mobility Bed Mobility General bed mobility comments: Pt found seated in recliner upon OT entering/exiting room  Transfers Overall transfer level: Needs assistance Equipment used: Rolling walker (2 wheeled) Transfers: Sit to/from Stand Sit to Stand: Min assist General transfer comment: pt up standing with NT. Assist to control descent to recliner. VCs safety, technique, hand/LE placement    Balance Overall balance assessment: Needs assistance Sitting-balance support: No upper extremity supported;Feet supported Sitting balance-Leahy Scale: Good     Standing balance support: Bilateral upper extremity supported;During functional activity Standing  balance-Leahy Scale: Fair    ADL Overall ADL's : Needs assistance/impaired Eating/Feeding: Set up;Sitting   Grooming: Set up;Sitting   Upper Body Bathing: Set up;Sitting   Lower Body Bathing: Min guard;Sit to/from stand   Upper Body Dressing : Set up;Sitting   Lower Body Dressing: Min guard;Sit to/from stand   Toilet Transfer: Min guard;RW;BSC;Ambulation   Toileting- Water quality scientist and Hygiene: Supervision/safety;Sitting/lateral lean       Functional mobility during ADLs: Supervision/safety;Min guard;Cueing for safety;Rolling walker;Cueing for sequencing General ADL Comments: Would like to practice walk-in shower transfer tomorrow prior to patient discharging home.      Pertinent Vitals/Pain Pain Assessment: 0-10 Pain Score: 2  Pain Location: left knee Pain Descriptors / Indicators: Aching;Sore Pain Intervention(s): Monitored during session;Repositioned;Ice applied   Extremity/Trunk Assessment Upper Extremity Assessment Upper Extremity Assessment: Overall WFL for tasks assessed   Lower Extremity Assessment Lower Extremity Assessment: Defer to PT evaluation LLE Deficits / Details: moves ankle well   Cervical / Trunk Assessment Cervical / Trunk Assessment: Normal   Communication Communication Communication: No difficulties   Cognition Arousal/Alertness: Awake/alert Behavior During Therapy: WFL for tasks assessed/performed (a little "happy" on meds) Overall Cognitive Status: Within Functional Limits for tasks assessed              Home Living Family/patient expects to be discharged to:: Private residence Living Arrangements: Spouse/significant other Available Help at Discharge: Family Type of Home: House Home Access: Stairs to enter Technical brewer of Steps: 1 Entrance Stairs-Rails: None Home Layout: Two level Alternate Level Stairs-Number of Steps: flight Alternate Level Stairs-Rails: Right;Left;Can reach both Bathroom Shower/Tub: Emergency planning/management officer: Standard     Home Equipment: Bedside commode;Walker - 4 wheels;Shower seat - built in    Prior Functioning/Environment Level  of Independence: Independent     OT Diagnosis: Generalized weakness;Acute pain   OT Problem List: Decreased strength;Decreased range of motion;Decreased activity tolerance;Impaired balance (sitting and/or standing);Decreased safety awareness;Decreased knowledge of use of DME or AE;Decreased knowledge of precautions;Pain   OT Treatment/Interventions: Self-care/ADL training;Therapeutic exercise;Energy conservation;DME and/or AE instruction;Balance training;Therapeutic activities;Patient/family education    OT Goals(Current goals can be found in the care plan section) Acute Rehab OT Goals Patient Stated Goal: go home tomorrow OT Goal Formulation: With patient/family Time For Goal Achievement: 07/14/15 Potential to Achieve Goals: Good ADL Goals Pt Will Perform Lower Body Bathing: with modified independence;sit to/from stand Pt Will Perform Lower Body Dressing: with modified independence;sit to/from stand Pt Will Transfer to Toilet: with modified independence;ambulating;bedside commode Pt Will Perform Tub/Shower Transfer: Shower transfer;ambulating;3 in 1;rolling walker;with modified independence  OT Frequency: Min 2X/week   Barriers to D/C: none known at this time       Co-evaluation PT/OT/SLP Co-Evaluation/Treatment: Yes Reason for Co-Treatment: For patient/therapist safety (low BP)   OT goals addressed during session: ADL's and self-care      End of Session Equipment Utilized During Treatment: Gait belt;Rolling walker;Left knee immobilizer CPM Left Knee CPM Left Knee: Off Nurse Communication: Mobility status  Activity Tolerance: Patient tolerated treatment well Patient left: in chair;with call bell/phone within reach;with family/visitor present   Time: 1005-1030 OT Time Calculation (min): 25 min Charges:  OT General  Charges $OT Visit: 1 Procedure OT Evaluation $OT Eval Low Complexity: 1 Procedure  Chrys Racer , MS, OTR/L, CLT Pager: [phone]  07/07/2015, 10:53 AM

## 2015-07-07 NOTE — Progress Notes (Signed)
Physical Therapy Treatment Patient Details Name: [name] MRN: [id number] DOB: Sep 11, 1945 Today's Date: 07/07/2015    History of Present Illness 70 yo female s/p L TKA 07/06/15. R TKA 2016    PT Comments    Progressing with mobility.   Follow Up Recommendations  Home health PT;Supervision/Assistance - 24 hour     Equipment Recommendations  None recommended by PT    Recommendations for Other Services       Precautions / Restrictions Precautions Precautions: Knee;Fall Required Braces or Orthoses: Knee Immobilizer - Left Knee Immobilizer - Left: Discontinue once straight leg raise with < 10 degree lag Restrictions Weight Bearing Restrictions: Yes LLE Weight Bearing: Weight bearing as tolerated    Mobility  Bed Mobility Overal bed mobility: Needs Assistance Bed Mobility: Sit to Supine       Sit to supine: Min assist   General bed mobility comments: Assist for L LE.   Transfers Overall transfer level: Needs assistance Equipment used: Rolling walker (2 wheeled) Transfers: Sit to/from Stand Sit to Stand: Min assist         General transfer comment: Assist to rise, stabilize, control descent. VCs safety, technique, hand placement.   Ambulation/Gait Ambulation/Gait assistance: Min guard Ambulation Distance (Feet): 65 Feet Assistive device: Rolling walker (2 wheeled) Gait Pattern/deviations: Step-to pattern;Trunk flexed     General Gait Details: close guard for safety. VCs sequence. slow gait speed.    Stairs            Wheelchair Mobility    Modified Rankin (Stroke Patients Only)       Balance Overall balance assessment: Needs assistance Sitting-balance support: No upper extremity supported;Feet supported Sitting balance-Leahy Scale: Good     Standing balance support: Bilateral upper extremity supported;During functional activity Standing balance-Leahy Scale: Fair                      Cognition Arousal/Alertness:  Awake/alert Behavior During Therapy: WFL for tasks assessed/performed Overall Cognitive Status: Within Functional Limits for tasks assessed                      Exercises Total Joint Exercises Ankle Circles/Pumps: AROM;Both;10 reps;Supine Quad Sets: AROM;Both;10 reps;Supine Heel Slides: AAROM;Left;10 reps;Supine Hip ABduction/ADduction: AAROM;Left;10 reps;Supine Straight Leg Raises: AAROM;Left;10 reps;Supine Goniometric ROM: ~10-50 degrees    General Comments        Pertinent Vitals/Pain Pain Assessment: 0-10 Pain Score: 3  Pain Location: L knee Pain Descriptors / Indicators: Aching;Sore Pain Intervention(s): Monitored during session;Ice applied;Repositioned    Home Living Family/patient expects to be discharged to:: Private residence Living Arrangements: Spouse/significant other Available Help at Discharge: Family Type of Home: House Home Access: Stairs to enter Entrance Stairs-Rails: None Home Layout: Two level;Able to live on main level with bedroom/bathroom Home Equipment: Bedside commode;Walker - 4 wheels;Shower seat - built in      Prior Function Level of Independence: Independent          PT Goals (current goals can now be found in the care plan section) Acute Rehab PT Goals Patient Stated Goal: go home tomorrow Progress towards PT goals: Progressing toward goals    Frequency  7X/week    PT Plan Current plan remains appropriate    Co-evaluation   Reason for Co-Treatment: For patient/therapist safety (low BP)   OT goals addressed during session: ADL's and self-care     End of Session Equipment Utilized During Treatment: Gait belt;Left knee immobilizer Activity Tolerance: Patient tolerated treatment well  Patient left: in bed;with call bell/phone within reach;with bed alarm set     Time: [id number] PT Time Calculation (min) (ACUTE ONLY): 37 min  Charges:  $Gait Training: 8-22 mins $Therapeutic Exercise: 8-22 mins                    G  Codes:      Weston Anna, MPT Pager: [phone]

## 2015-07-07 NOTE — Progress Notes (Signed)
   Subjective: 1 Day Post-Op Procedure(s) (LRB): LEFT TOTAL KNEE ARTHROPLASTY (Left) Patient reports pain as mild.   Patient seen in rounds with Dr. Wynelle Link. Doing okay. Not much sleep. Patient is well, but has had some minor complaints of pain in the knee, requiring pain medications We will start therapy today.  Plan is to go Home after hospital stay.  Objective: Vital signs in last 24 hours: Temp:  [97 F (36.1 C)-98.9 F (37.2 C)] 98.2 F (36.8 C) (02/28 0714) Pulse Rate:  [50-73] 50 (02/28 0714) Resp:  [10-20] 16 (02/28 0714) BP: (92-133)/(42-70) 92/42 mmHg (02/28 0714) SpO2:  [99 %-100 %] 100 % (02/28 0714) Weight:  [75.751 kg (167 lb)] 75.751 kg (167 lb) (02/27 0938)  Intake/Output from previous day:  Intake/Output Summary (Last 24 hours) at 07/07/15 [id number] Last data filed at 07/07/15 0815  Gross per 24 hour  Intake 3428.75 ml  Output   5395 ml  Net -1966.25 ml    Intake/Output this shift: Total I/O In: -  Out: 525 [Urine:525]  Labs:  Recent Labs  07/07/15 0356  HGB 9.5*    Recent Labs  07/07/15 0356  WBC 7.1  RBC 3.59*  HCT 29.3*  PLT 270    Recent Labs  07/07/15 0356  NA 133*  K 4.9  CL 100*  CO2 28  BUN 9  CREATININE 0.65  GLUCOSE 220*  CALCIUM 8.7*   No results for input(s): LABPT, INR in the last 72 hours.  EXAM General - Patient is Alert, Appropriate and Oriented Extremity - Neurovascular intact Sensation intact distally Dorsiflexion/Plantar flexion intact Dressing - dressing C/D/I Motor Function - intact, moving foot and toes well on exam.  Hemovac pulled without difficulty.  Past Medical History  Diagnosis Date  . Hypertension   . Hyperlipidemia   . Arthritis   . History of duodenal ulcer 1989  . Stress incontinence   . Anxiety   . Depression   . Bipolar 1 disorder ([place])   . History of breast cancer 1995    lumpectomy and radiation left breast  . Cancer ([place])     hx breast cancer  . Asthma     as a child  .  Diabetes mellitus without complication ([facility])     type 2    Assessment/Plan: 1 Day Post-Op Procedure(s) (LRB): LEFT TOTAL KNEE ARTHROPLASTY (Left) Principal Problem:   OA (osteoarthritis) of knee  Estimated body mass index is 27.99 kg/(m^2) as calculated from the following:   Height as of this encounter: 5' 4.75" (1.645 m).   Weight as of this encounter: 75.751 kg (167 lb). Advance diet Up with therapy Plan for discharge tomorrow Discharge home with home health  DVT Prophylaxis - Xarelto Weight-Bearing as tolerated to left leg D/C O2 and Pulse OX and try on Room Air HGB 9.5 on POD 1.  Add iron.  Arlee Muslim, PA-C Orthopaedic Surgery 07/07/2015, 8:22 AM

## 2015-07-07 NOTE — Discharge Instructions (Addendum)
° °Dr. Frank Aluisio °Total Joint Specialist °[facility] °[address] °[place], [place] [address] °[phone] ° °TOTAL KNEE REPLACEMENT POSTOPERATIVE DIRECTIONS ° °Knee Rehabilitation, Guidelines Following Surgery  °Results after knee surgery are often greatly improved when you follow the exercise, range of motion and muscle strengthening exercises prescribed by your doctor. Safety measures are also important to protect the knee from further injury. Any time any of these exercises cause you to have increased pain or swelling in your knee joint, decrease the amount until you are comfortable again and slowly increase them. If you have problems or questions, call your caregiver or physical therapist for advice.  ° °HOME CARE INSTRUCTIONS  °Remove items at home which could result in a fall. This includes throw rugs or furniture in walking pathways.  °· ICE to the affected knee every three hours for 30 minutes at a time and then as needed for pain and swelling.  Continue to use ice on the knee for pain and swelling from surgery. You may notice swelling that will progress down to the foot and ankle.  This is normal after surgery.  Elevate the leg when you are not up walking on it.   °· Continue to use the breathing machine which will help keep your temperature down.  It is common for your temperature to cycle up and down following surgery, especially at night when you are not up moving around and exerting yourself.  The breathing machine keeps your lungs expanded and your temperature down. °· Do not place pillow under knee, focus on keeping the knee straight while resting ° °DIET °You may resume your previous home diet once your are discharged from the hospital. ° °DRESSING / WOUND CARE / SHOWERING °You may shower 3 days after surgery, but keep the wounds dry during showering.  You may use an occlusive plastic wrap (Press'n Seal for example), NO SOAKING/SUBMERGING IN THE BATHTUB.  If the  bandage gets wet, change with a clean dry gauze.  If the incision gets wet, pat the wound dry with a clean towel. °You may start showering once you are discharged home but do not submerge the incision under water. Just pat the incision dry and apply a dry gauze dressing on daily. °Change the surgical dressing daily and reapply a dry dressing each time. ° °ACTIVITY °Walk with your walker as instructed. °Use walker as long as suggested by your caregivers. °Avoid periods of inactivity such as sitting longer than an hour when not asleep. This helps prevent blood clots.  °You may resume a sexual relationship in one month or when given the OK by your doctor.  °You may return to work once you are cleared by your doctor.  °Do not drive a car for 6 weeks or until released by you surgeon.  °Do not drive while taking narcotics. ° °WEIGHT BEARING °Weight bearing as tolerated with assist device (walker, cane, etc) as directed, use it as long as suggested by your surgeon or therapist, typically at least 4-6 weeks. ° °POSTOPERATIVE CONSTIPATION PROTOCOL °Constipation - defined medically as fewer than three stools per week and severe constipation as less than one stool per week. ° °One of the most common issues patients have following surgery is constipation.  Even if you have a regular bowel pattern at home, your normal regimen is likely to be disrupted due to multiple reasons following surgery.  Combination of anesthesia, postoperative narcotics, change in appetite and fluid intake all can affect your bowels.    In order to avoid complications following surgery, here are some recommendations in order to help you during your recovery period. ° °Colace (docusate) - Pick up an over-the-counter form of Colace or another stool softener and take twice a day as long as you are requiring postoperative pain medications.  Take with a full glass of water daily.  If you experience loose stools or diarrhea, hold the colace until you stool forms  back up.  If your symptoms do not get better within 1 week or if they get worse, check with your doctor. ° °Dulcolax (bisacodyl) - Pick up over-the-counter and take as directed by the product packaging as needed to assist with the movement of your bowels.  Take with a full glass of water.  Use this product as needed if not relieved by Colace only.  ° °MiraLax (polyethylene glycol) - Pick up over-the-counter to have on hand.  MiraLax is a solution that will increase the amount of water in your bowels to assist with bowel movements.  Take as directed and can mix with a glass of water, juice, soda, coffee, or tea.  Take if you go more than two days without a movement. °Do not use MiraLax more than once per day. Call your doctor if you are still constipated or irregular after using this medication for 7 days in a row. ° °If you continue to have problems with postoperative constipation, please contact the office for further assistance and recommendations.  If you experience "the worst abdominal pain ever" or develop nausea or vomiting, please contact the office immediatly for further recommendations for treatment. ° °ITCHING ° If you experience itching with your medications, try taking only a single pain pill, or even half a pain pill at a time.  You can also use Benadryl over the counter for itching or also to help with sleep.  ° °TED HOSE STOCKINGS °Wear the elastic stockings on both legs for three weeks following surgery during the day but you may remove then at night for sleeping. ° °MEDICATIONS °See your medication summary on the “After Visit Summary” that the nursing staff will review with you prior to discharge.  You may have some home medications which will be placed on hold until you complete the course of blood thinner medication.  It is important for you to complete the blood thinner medication as prescribed by your surgeon.  Continue your approved medications as instructed at time of  discharge. ° °PRECAUTIONS °If you experience chest pain or shortness of breath - call 911 immediately for transfer to the hospital emergency department.  °If you develop a fever greater that 101 F, purulent drainage from wound, increased redness or drainage from wound, foul odor from the wound/dressing, or calf pain - CONTACT YOUR SURGEON.   °                                                °FOLLOW-UP APPOINTMENTS °Make sure you keep all of your appointments after your operation with your surgeon and caregivers. You should call the office at the above phone number and make an appointment for approximately two weeks after the date of your surgery or on the date instructed by your surgeon outlined in the "After Visit Summary". ° ° °RANGE OF MOTION AND STRENGTHENING EXERCISES  °Rehabilitation of the knee is important following a knee injury or   an operation. After just a few days of immobilization, the muscles of the thigh which control the knee become weakened and shrink (atrophy). Knee exercises are designed to build up the tone and strength of the thigh muscles and to improve knee motion. Often times heat used for twenty to thirty minutes before working out will loosen up your tissues and help with improving the range of motion but do not use heat for the first two weeks following surgery. These exercises can be done on a training (exercise) mat, on the floor, on a table or on a bed. Use what ever works the best and is most comfortable for you Knee exercises include:  °Leg Lifts - While your knee is still immobilized in a splint or cast, you can do straight leg raises. Lift the leg to 60 degrees, hold for 3 sec, and slowly lower the leg. Repeat 10-20 times 2-3 times daily. Perform this exercise against resistance later as your knee gets better.  °Quad and Hamstring Sets - Tighten up the muscle on the front of the thigh (Quad) and hold for 5-10 sec. Repeat this 10-20 times hourly. Hamstring sets are done by pushing the  foot backward against an object and holding for 5-10 sec. Repeat as with quad sets.  °· Leg Slides: Lying on your back, slowly slide your foot toward your buttocks, bending your knee up off the floor (only go as far as is comfortable). Then slowly slide your foot back down until your leg is flat on the floor again. °· Angel Wings: Lying on your back spread your legs to the side as far apart as you can without causing discomfort.  °A rehabilitation program following serious knee injuries can speed recovery and prevent re-injury in the future due to weakened muscles. Contact your doctor or a physical therapist for more information on knee rehabilitation.  ° °IF YOU ARE TRANSFERRED TO A SKILLED REHAB FACILITY °If the patient is transferred to a skilled rehab facility following release from the hospital, a list of the current medications will be sent to the facility for the patient to continue.  When discharged from the skilled rehab facility, please have the facility set up the patient's Home Health Physical Therapy prior to being released. Also, the skilled facility will be responsible for providing the patient with their medications at time of release from the facility to include their pain medication, the muscle relaxants, and their blood thinner medication. If the patient is still at the rehab facility at time of the two week follow up appointment, the skilled rehab facility will also need to assist the patient in arranging follow up appointment in our office and any transportation needs. ° °MAKE SURE YOU:  °Understand these instructions.  °Get help right away if you are not doing well or get worse.  ° ° °Pick up stool softner and laxative for home use following surgery while on pain medications. °Do not submerge incision under water. °Please use good hand washing techniques while changing dressing each day. °May shower starting three days after surgery. °Please use a clean towel to pat the incision dry following  showers. °Continue to use ice for pain and swelling after surgery. °Do not use any lotions or creams on the incision until instructed by your surgeon. ° °Take Xarelto for two and a half more weeks, then discontinue Xarelto. °Once the patient has completed the blood thinner regimen, then take a Baby 81 mg Aspirin daily for three more weeks. ° ° °Information   on my medicine - XARELTO® (Rivaroxaban) ° °This medication education was reviewed with me or my healthcare representative as part of my discharge preparation.  The pharmacist that spoke with me during my hospital stay was:  Runyon, Amanda, RPH ° °Why was Xarelto® prescribed for you? °Xarelto® was prescribed for you to reduce the risk of blood clots forming after orthopedic surgery. The medical term for these abnormal blood clots is venous thromboembolism (VTE). ° °What do you need to know about xarelto® ? °Take your Xarelto® ONCE DAILY at the same time every day. °You may take it either with or without food. ° °If you have difficulty swallowing the tablet whole, you may crush it and mix in applesauce just prior to taking your dose. ° °Take Xarelto® exactly as prescribed by your doctor and DO NOT stop taking Xarelto® without talking to the doctor who prescribed the medication.  Stopping without other VTE prevention medication to take the place of Xarelto® may increase your risk of developing a clot. ° °After discharge, you should have regular check-up appointments with your healthcare provider that is prescribing your Xarelto®.   ° °What do you do if you miss a dose? °If you miss a dose, take it as soon as you remember on the same day then continue your regularly scheduled once daily regimen the next day. Do not take two doses of Xarelto® on the same day.  ° °Important Safety Information °A possible side effect of Xarelto® is bleeding. You should call your healthcare provider right away if you experience any of the following: °? Bleeding from an injury or your nose  that does not stop. °? Unusual colored urine (red or dark brown) or unusual colored stools (red or black). °? Unusual bruising for unknown reasons. °? A serious fall or if you hit your head (even if there is no bleeding). ° °Some medicines may interact with Xarelto® and might increase your risk of bleeding while on Xarelto®. To help avoid this, consult your healthcare provider or pharmacist prior to using any new prescription or non-prescription medications, including herbals, vitamins, non-steroidal anti-inflammatory drugs (NSAIDs) and supplements. ° °This website has more information on Xarelto®: www.xarelto.com. ° ° °

## 2015-07-07 NOTE — Evaluation (Signed)
Physical Therapy Evaluation Patient Details Name: TASHIYAH ORREN MRN: [id number] DOB: 1945-05-28 Today's Date: 07/07/2015   History of Present Illness  70 yo female s/p L TKA 07/06/15. R TKA 2016  Clinical Impression  On eval, pt required Min assist for mobility-walked ~50 feet with RW. Pain rated 2/10. Will follow and progress activity as able.     Follow Up Recommendations Home health PT;Supervision/Assistance - 24 hour    Equipment Recommendations  None recommended by PT    Recommendations for Other Services OT consult     Precautions / Restrictions Precautions Precautions: Fall;Knee Required Braces or Orthoses: Knee Immobilizer - Left Knee Immobilizer - Left: Discontinue once straight leg raise with < 10 degree lag Restrictions Weight Bearing Restrictions: No LLE Weight Bearing: Weight bearing as tolerated      Mobility  Bed Mobility                  Transfers Overall transfer level: Needs assistance Equipment used: Rolling walker (2 wheeled) Transfers: Sit to/from Stand Sit to Stand: Min assist         General transfer comment: pt up standing with NT. Assist to control descent to recliner. VCs safety, technique, hand/LE placement  Ambulation/Gait Ambulation/Gait assistance: Min assist Ambulation Distance (Feet): 50 Feet Assistive device: Rolling walker (2 wheeled) Gait Pattern/deviations: Step-to pattern;Trunk flexed     General Gait Details: Assist to stabilize during ambulation. VCs safety, sequence. slow gait speed.   Stairs            Wheelchair Mobility    Modified Rankin (Stroke Patients Only)       Balance Overall balance assessment: Needs assistance         Standing balance support: Bilateral upper extremity supported;During functional activity Standing balance-Leahy Scale: Poor                               Pertinent Vitals/Pain Pain Assessment: 0-10 Pain Score: 2  Pain Location: L knee Pain  Descriptors / Indicators: Aching;Sore Pain Intervention(s): Monitored during session;Repositioned;Ice applied    Home Living Family/patient expects to be discharged to:: Private residence Living Arrangements: Spouse/significant other Available Help at Discharge: Family Type of Home: House Home Access: Stairs to enter   Technical brewer of Steps: 2 Home Layout: Two level        Prior Function Level of Independence: Independent               Hand Dominance        Extremity/Trunk Assessment   Upper Extremity Assessment: Defer to OT evaluation           Lower Extremity Assessment: LLE deficits/detail   LLE Deficits / Details: moves ankle well  Cervical / Trunk Assessment: Normal  Communication   Communication: No difficulties  Cognition Arousal/Alertness: Awake/alert Behavior During Therapy: WFL for tasks assessed/performed (a little 'happy" on meds) Overall Cognitive Status: Within Functional Limits for tasks assessed                      General Comments      Exercises        Assessment/Plan    PT Assessment Patient needs continued PT services  PT Diagnosis Difficulty walking;Generalized weakness;Acute pain   PT Problem List Decreased strength;Decreased range of motion;Decreased activity tolerance;Decreased balance;Decreased mobility;Decreased knowledge of use of DME;Pain  PT Treatment Interventions DME instruction;Gait training;Functional mobility training;Therapeutic activities;Patient/family education;Balance training;Therapeutic exercise  PT Goals (Current goals can be found in the Care Plan section) Acute Rehab PT Goals Patient Stated Goal: home with spouse.  PT Goal Formulation: With patient/family Time For Goal Achievement: 07/14/15 Potential to Achieve Goals: Good    Frequency 7X/week   Barriers to discharge        Co-evaluation               End of Session Equipment Utilized During Treatment: Gait belt;Left  knee immobilizer Activity Tolerance: Patient tolerated treatment well Patient left: in chair;with call bell/phone within reach;with family/visitor present           Time: 1004-1015 PT Time Calculation (min) (ACUTE ONLY): 11 min   Charges:   PT Evaluation $PT Eval Low Complexity: 1 Procedure     PT G Codes:        Weston Anna, MPT Pager: [phone]

## 2015-07-07 NOTE — Discharge Summary (Signed)
Physician Discharge Summary   Patient ID: [name] MRN: [id number] DOB/AGE: [birth year] 70 y.o.  Admit date: 07/06/2015 Discharge date: 07/08/2015  Primary Diagnosis:  Osteoarthritis Left knee(s) Admission Diagnoses:  Past Medical History  Diagnosis Date  . Hypertension   . Hyperlipidemia   . Arthritis   . History of duodenal ulcer 1989  . Stress incontinence   . Anxiety   . Depression   . Bipolar 1 disorder ([place])   . History of breast cancer 1995    lumpectomy and radiation left breast  . Cancer ([place])     hx breast cancer  . Asthma     as a child  . Diabetes mellitus without complication ([place])     type 2   Discharge Diagnoses:   Principal Problem:   OA (osteoarthritis) of knee  Estimated body mass index is 27.99 kg/(m^2) as calculated from the following:   Height as of this encounter: 5' 4.75" (1.645 m).   Weight as of this encounter: 75.751 kg (167 lb).  Procedure:  Procedure(s) (LRB): LEFT TOTAL KNEE ARTHROPLASTY (Left)   Consults: None  HPI: [name] is a 70 y.o. year old female with end stage OA of her left knee with progressively worsening pain and dysfunction. She has constant pain, with activity and at rest and significant functional deficits with difficulties even with ADLs. She has had extensive non-op management including analgesics, injections of cortisone and viscosupplements, and home exercise program, but remains in significant pain with significant dysfunction. Radiographs show bone on bone arthritis patellofemoral with medial narrowing. She presents now for left Total Knee Arthroplasty.  Laboratory Data: Admission on 07/06/2015, Discharged on 07/08/2015  Component Date Value Ref Range Status  . Glucose-Capillary 07/06/2015 104* 65 - 99 mg/dL Final  . Glucose-Capillary 07/06/2015 67  65 - 99 mg/dL Final  . Glucose-Capillary 07/06/2015 68  65 - 99 mg/dL Final  . Glucose-Capillary 07/06/2015 74  65 - 99 mg/dL Final  . Glucose-Capillary  07/06/2015 70  65 - 99 mg/dL Final  . Glucose-Capillary 07/06/2015 101* 65 - 99 mg/dL Final  . Glucose-Capillary 07/06/2015 115* 65 - 99 mg/dL Final  . Comment 1 07/06/2015 Notify RN   Final  . Comment 2 07/06/2015 Document in Chart   Final  . WBC 07/07/2015 7.1  4.0 - 10.5 K/uL Final  . RBC 07/07/2015 3.59* 3.87 - 5.11 MIL/uL Final  . Hemoglobin 07/07/2015 9.5* 12.0 - 15.0 g/dL Final  . HCT 07/07/2015 29.3* 36.0 - 46.0 % Final  . MCV 07/07/2015 81.6  78.0 - 100.0 fL Final  . MCH 07/07/2015 26.5  26.0 - 34.0 pg Final  . MCHC 07/07/2015 32.4  30.0 - 36.0 g/dL Final  . RDW 07/07/2015 15.1  11.5 - 15.5 % Final  . Platelets 07/07/2015 270  150 - 400 K/uL Final  . Sodium 07/07/2015 133* 135 - 145 mmol/L Final  . Potassium 07/07/2015 4.9  3.5 - 5.1 mmol/L Final  . Chloride 07/07/2015 100* 101 - 111 mmol/L Final  . CO2 07/07/2015 28  22 - 32 mmol/L Final  . Glucose, Bld 07/07/2015 220* 65 - 99 mg/dL Final  . BUN 07/07/2015 9  6 - 20 mg/dL Final  . Creatinine, Ser 07/07/2015 0.65  0.44 - 1.00 mg/dL Final  . Calcium 07/07/2015 8.7* 8.9 - 10.3 mg/dL Final  . GFR calc non Af Amer 07/07/2015 >60  >60 mL/min Final  . GFR calc Af Amer 07/07/2015 >60  >60 mL/min Final   Comment: (NOTE)  The eGFR has been calculated using the CKD EPI equation. This calculation has not been validated in all clinical situations. eGFR's persistently <60 mL/min signify possible Chronic Kidney Disease.   . Anion gap 07/07/2015 5  5 - 15 Final  . Glucose-Capillary 07/06/2015 289* 65 - 99 mg/dL Final  . Glucose-Capillary 07/06/2015 261* 65 - 99 mg/dL Final  . Glucose-Capillary 07/07/2015 187* 65 - 99 mg/dL Final  . Glucose-Capillary 07/07/2015 232* 65 - 99 mg/dL Final  . WBC 07/08/2015 6.9  4.0 - 10.5 K/uL Final  . RBC 07/08/2015 3.61* 3.87 - 5.11 MIL/uL Final  . Hemoglobin 07/08/2015 9.7* 12.0 - 15.0 g/dL Final  . HCT 07/08/2015 29.3* 36.0 - 46.0 % Final  . MCV 07/08/2015 81.2  78.0 - 100.0 fL Final  . MCH  07/08/2015 26.9  26.0 - 34.0 pg Final  . MCHC 07/08/2015 33.1  30.0 - 36.0 g/dL Final  . RDW 07/08/2015 15.3  11.5 - 15.5 % Final  . Platelets 07/08/2015 307  150 - 400 K/uL Final  . Sodium 07/08/2015 134* 135 - 145 mmol/L Final  . Potassium 07/08/2015 4.7  3.5 - 5.1 mmol/L Final  . Chloride 07/08/2015 102  101 - 111 mmol/L Final  . CO2 07/08/2015 21* 22 - 32 mmol/L Final  . Glucose, Bld 07/08/2015 231* 65 - 99 mg/dL Final  . BUN 07/08/2015 10  6 - 20 mg/dL Final  . Creatinine, Ser 07/08/2015 0.66  0.44 - 1.00 mg/dL Final  . Calcium 07/08/2015 9.2  8.9 - 10.3 mg/dL Final  . GFR calc non Af Amer 07/08/2015 >60  >60 mL/min Final  . GFR calc Af Amer 07/08/2015 >60  >60 mL/min Final   Comment: (NOTE) The eGFR has been calculated using the CKD EPI equation. This calculation has not been validated in all clinical situations. eGFR's persistently <60 mL/min signify possible Chronic Kidney Disease.   . Anion gap 07/08/2015 11  5 - 15 Final  . Glucose-Capillary 07/07/2015 216* 65 - 99 mg/dL Final  . Glucose-Capillary 07/07/2015 307* 65 - 99 mg/dL Final  . Comment 1 07/07/2015 Notify RN   Final  . Comment 2 07/07/2015 Document in Chart   Final  . Glucose-Capillary 07/08/2015 210* 65 - 99 mg/dL Final  . Glucose-Capillary 07/08/2015 318* 65 - 99 mg/dL Final  Hospital Outpatient Visit on 06/26/2015  Component Date Value Ref Range Status  . aPTT 06/26/2015 29  24 - 37 seconds Final  . WBC 06/26/2015 4.9  4.0 - 10.5 K/uL Final  . RBC 06/26/2015 4.30  3.87 - 5.11 MIL/uL Final  . Hemoglobin 06/26/2015 11.4* 12.0 - 15.0 g/dL Final  . HCT 06/26/2015 34.9* 36.0 - 46.0 % Final  . MCV 06/26/2015 81.2  78.0 - 100.0 fL Final  . MCH 06/26/2015 26.5  26.0 - 34.0 pg Final  . MCHC 06/26/2015 32.7  30.0 - 36.0 g/dL Final  . RDW 06/26/2015 15.3  11.5 - 15.5 % Final  . Platelets 06/26/2015 323  150 - 400 K/uL Final  . Sodium 06/26/2015 137  135 - 145 mmol/L Final  . Potassium 06/26/2015 4.8  3.5 - 5.1 mmol/L  Final  . Chloride 06/26/2015 102  101 - 111 mmol/L Final  . CO2 06/26/2015 26  22 - 32 mmol/L Final  . Glucose, Bld 06/26/2015 41* 65 - 99 mg/dL Final   Comment: CRITICAL RESULT CALLED TO, READ BACK BY AND VERIFIED WITH: K.STANLEY AT 1041 ON 06/26/15 BY S.VANHOORNE   . BUN 06/26/2015 16  6 - 20 mg/dL Final  . Creatinine, Ser 06/26/2015 0.64  0.44 - 1.00 mg/dL Final  . Calcium 06/26/2015 10.0  8.9 - 10.3 mg/dL Final  . Total Protein 06/26/2015 7.1  6.5 - 8.1 g/dL Final  . Albumin 06/26/2015 4.1  3.5 - 5.0 g/dL Final  . AST 06/26/2015 21  15 - 41 U/L Final  . ALT 06/26/2015 14  14 - 54 U/L Final  . Alkaline Phosphatase 06/26/2015 42  38 - 126 U/L Final  . Total Bilirubin 06/26/2015 0.4  0.3 - 1.2 mg/dL Final  . GFR calc non Af Amer 06/26/2015 >60  >60 mL/min Final  . GFR calc Af Amer 06/26/2015 >60  >60 mL/min Final   Comment: (NOTE) The eGFR has been calculated using the CKD EPI equation. This calculation has not been validated in all clinical situations. eGFR's persistently <60 mL/min signify possible Chronic Kidney Disease.   . Anion gap 06/26/2015 9  5 - 15 Final  . Prothrombin Time 06/26/2015 12.3  11.6 - 15.2 seconds Final  . INR 06/26/2015 0.89  0.00 - 1.49 Final  . ABO/RH(D) 06/26/2015 A POS   Final  . Antibody Screen 06/26/2015 NEG   Final  . Sample Expiration 06/26/2015 07/09/2015   Final  . Extend sample reason 06/26/2015 NO TRANSFUSIONS OR PREGNANCY IN THE PAST 3 MONTHS   Final  . Color, Urine 06/26/2015 YELLOW  YELLOW Final  . APPearance 06/26/2015 CLEAR  CLEAR Final  . Specific Gravity, Urine 06/26/2015 1.017  1.005 - 1.030 Final  . pH 06/26/2015 5.5  5.0 - 8.0 Final  . Glucose, UA 06/26/2015 NEGATIVE  NEGATIVE mg/dL Final  . Hgb urine dipstick 06/26/2015 NEGATIVE  NEGATIVE Final  . Bilirubin Urine 06/26/2015 NEGATIVE  NEGATIVE Final  . Ketones, ur 06/26/2015 NEGATIVE  NEGATIVE mg/dL Final  . Protein, ur 06/26/2015 NEGATIVE  NEGATIVE mg/dL Final  . Nitrite  06/26/2015 NEGATIVE  NEGATIVE Final  . Leukocytes, UA 06/26/2015 TRACE* NEGATIVE Final  . Hgb A1c MFr Bld 06/26/2015 7.2* 4.8 - 5.6 % Final   Comment: (NOTE)         Pre-diabetes: 5.7 - 6.4         Diabetes: >6.4         Glycemic control for adults with diabetes: <7.0   . Mean Plasma Glucose 06/26/2015 160   Final   Comment: (NOTE) Performed At: [facility] [place], [place] [id number] Lindon Romp MD OM:[id number]   . MRSA, PCR 06/26/2015 NEGATIVE  NEGATIVE Final  . Staphylococcus aureus 06/26/2015 NEGATIVE  NEGATIVE Final   Comment:        The Xpert SA Assay (FDA approved for NASAL specimens in patients over 40 years of age), is one component of a comprehensive surveillance program.  Test performance has been validated by [facility] for patients greater than or equal to 10 year old. It is not intended to diagnose infection nor to guide or monitor treatment.   . Squamous Epithelial / LPF 06/26/2015 0-5* NONE SEEN Final  . WBC, UA 06/26/2015 0-5  0 - 5 WBC/hpf Final  . RBC / HPF 06/26/2015 NONE SEEN  0 - 5 RBC/hpf Final  . Bacteria, UA 06/26/2015 RARE* NONE SEEN Final     X-Rays:No results found.  EKG:No orders found for this or any previous visit.   Hospital Course: [name] is a 70 y.o. who was admitted to [facility]. They were brought to the operating room on 07/06/2015  and underwent Procedure(s): LEFT TOTAL KNEE ARTHROPLASTY.  Patient tolerated the procedure well and was later transferred to the recovery room and then to the orthopaedic floor for postoperative care.  They were given PO and IV analgesics for pain control following their surgery.  They were given 24 hours of postoperative antibiotics of  Anti-infectives    Start     Dose/Rate Route Frequency Ordered Stop   07/06/15 1830  ceFAZolin (ANCEF) IVPB 2 g/50 mL premix     2 g 100 mL/hr over 30 Minutes Intravenous Every 6 hours 07/06/15 1504 07/07/15 0012   07/06/15  1002  ceFAZolin (ANCEF) IVPB 2 g/50 mL premix     2 g 100 mL/hr over 30 Minutes Intravenous On call to O.R. 07/06/15 1002 07/06/15 1224     and started on DVT prophylaxis in the form of Xarelto.   PT and OT were ordered for total joint protocol.  Discharge planning consulted to help with postop disposition and equipment needs.  Patient had a good night on the evening of surgery except for not much sleep. They started to get up OOB with therapy on day one. Hemovac drain was pulled without difficulty.  Continued to work with therapy into day two.  Dressing was changed on day two and the incision was healing well.  Patient was seen in rounds and was ready to go home.  Discharge home with home health Diet - Cardiac diet and Diabetic diet Follow up - in 2 weeks Activity - WBAT Disposition - Home Condition Upon Discharge - Good D/C Meds - See DC Summary DVT Prophylaxis - Xarelto      Discharge Instructions    Call MD / Call 911    Complete by:  As directed   If you experience chest pain or shortness of breath, CALL 911 and be transported to the hospital emergency room.  If you develope a fever above 101 F, pus (white drainage) or increased drainage or redness at the wound, or calf pain, call your surgeon's office.     Change dressing    Complete by:  As directed   Change dressing daily with sterile 4 x 4 inch gauze dressing and apply TED hose. Do not submerge the incision under water.     Constipation Prevention    Complete by:  As directed   Drink plenty of fluids.  Prune juice may be helpful.  You may use a stool softener, such as Colace (over the counter) 100 mg twice a day.  Use MiraLax (over the counter) for constipation as needed.     Diet - low sodium heart healthy    Complete by:  As directed      Diet Carb Modified    Complete by:  As directed      Discharge instructions    Complete by:  As directed   Pick up stool softner and laxative for home use following surgery while on pain  medications. Do not submerge incision under water. Please use good hand washing techniques while changing dressing each day. May shower starting three days after surgery. Please use a clean towel to pat the incision dry following showers. Continue to use ice for pain and swelling after surgery. Do not use any lotions or creams on the incision until instructed by your surgeon.  Take Xarelto for two and a half more weeks, then discontinue Xarelto. Once the patient has completed the blood thinner regimen, then take a Baby 81 mg Aspirin daily for three  more weeks.  Postoperative Constipation Protocol  Constipation - defined medically as fewer than three stools per week and severe constipation as less than one stool per week.  One of the most common issues patients have following surgery is constipation.  Even if you have a regular bowel pattern at home, your normal regimen is likely to be disrupted due to multiple reasons following surgery.  Combination of anesthesia, postoperative narcotics, change in appetite and fluid intake all can affect your bowels.  In order to avoid complications following surgery, here are some recommendations in order to help you during your recovery period.  Colace (docusate) - Pick up an over-the-counter form of Colace or another stool softener and take twice a day as long as you are requiring postoperative pain medications.  Take with a full glass of water daily.  If you experience loose stools or diarrhea, hold the colace until you stool forms back up.  If your symptoms do not get better within 1 week or if they get worse, check with your doctor.  Dulcolax (bisacodyl) - Pick up over-the-counter and take as directed by the product packaging as needed to assist with the movement of your bowels.  Take with a full glass of water.  Use this product as needed if not relieved by Colace only.   MiraLax (polyethylene glycol) - Pick up over-the-counter to have on hand.  MiraLax  is a solution that will increase the amount of water in your bowels to assist with bowel movements.  Take as directed and can mix with a glass of water, juice, soda, coffee, or tea.  Take if you go more than two days without a movement. Do not use MiraLax more than once per day. Call your doctor if you are still constipated or irregular after using this medication for 7 days in a row.  If you continue to have problems with postoperative constipation, please contact the office for further assistance and recommendations.  If you experience "the worst abdominal pain ever" or develop nausea or vomiting, please contact the office immediatly for further recommendations for treatment.     Do not put a pillow under the knee. Place it under the heel.    Complete by:  As directed      Do not sit on low chairs, stoools or toilet seats, as it may be difficult to get up from low surfaces    Complete by:  As directed      Driving restrictions    Complete by:  As directed   No driving until released by the physician.     Increase activity slowly as tolerated    Complete by:  As directed      Lifting restrictions    Complete by:  As directed   No lifting until released by the physician.     Patient may shower    Complete by:  As directed   You may shower without a dressing once there is no drainage.  Do not wash over the wound.  If drainage remains, do not shower until drainage stops.     TED hose    Complete by:  As directed   Use stockings (TED hose) for 3 weeks on both leg(s).  You may remove them at night for sleeping.     Weight bearing as tolerated    Complete by:  As directed   Laterality:  left  Extremity:  Lower            Medication  List    STOP taking these medications        CALCIUM 600 + D PO     COQ10 PO     GLUCOSAMINE CHONDROITIN ADV PO     MEGARED OMEGA-3 KRILL OIL PO     TURMERIC PO      TAKE these medications        acetaminophen 500 MG tablet  Commonly known as:   TYLENOL  Take 500-1,000 mg by mouth every 6 (six) hours as needed (For pain.).     ARIPiprazole 30 MG tablet  Commonly known as:  ABILIFY  Take 30 mg by mouth at bedtime.     CHLOR-TRIMETON ALLERGY PO  Take 1 tablet by mouth 2 (two) times daily as needed (Takes one daily but will take a second one if needed for allergies).     clonazePAM 0.5 MG tablet  Commonly known as:  KLONOPIN  Take 1 mg by mouth at bedtime.     insulin lispro protamine-lispro (75-25) 100 UNIT/ML Susp injection  Commonly known as:  HUMALOG 75/25 MIX  Inject 12-14 Units into the skin 2 (two) times daily. Sliding scale depending on time of day and what eating.     iron polysaccharides 150 MG capsule  Commonly known as:  NIFEREX  Take 1 capsule (150 mg total) by mouth daily. Take for three weeks and then discontinue.     metFORMIN 500 MG 24 hr tablet  Commonly known as:  GLUCOPHAGE-XR  Take 1,000 mg by mouth 2 (two) times daily.     methocarbamol 500 MG tablet  Commonly known as:  ROBAXIN  Take 1 tablet (500 mg total) by mouth every 6 (six) hours as needed for muscle spasms.     oxyCODONE 5 MG immediate release tablet  Commonly known as:  Oxy IR/ROXICODONE  Take 1-2 tablets (5-10 mg total) by mouth every 3 (three) hours as needed for moderate pain or severe pain.     pravastatin 20 MG tablet  Commonly known as:  PRAVACHOL  Take 20 mg by mouth daily after breakfast.     ramipril 5 MG capsule  Commonly known as:  ALTACE  Take 5 mg by mouth at bedtime.     rivaroxaban 10 MG Tabs tablet  Commonly known as:  XARELTO  Take 1 tablet (10 mg total) by mouth daily with breakfast. Take Xarelto for two and a half more weeks, then discontinue Xarelto. Once the patient has completed the blood thinner regimen, then take a Baby 81 mg Aspirin daily for three more weeks.     sitaGLIPtin 100 MG tablet  Commonly known as:  JANUVIA  Take 100 mg by mouth daily with breakfast.     traMADol 50 MG tablet  Commonly known  as:  ULTRAM  Take 1-2 tablets (50-100 mg total) by mouth every 6 (six) hours as needed (mild pain).       Follow-up Information    Follow up with [facility].   Why:  home health physical therapy   Contact information:   [place] [address] [phone]       Follow up with [facility].   Why:  rolling walker   Contact information:   [address] [place] [address] [phone]       Follow up with [name], MD. Schedule an appointment as soon as possible for a visit on 07/21/2015.   Specialty:  Orthopedic Surgery  Why:  Call office at [phone] to setup appointment on Tuesday 07/21/2015 with Dr. Wynelle Link.   Contact information:   [address] [place] [address] [phone]       Signed: Arlee Muslim, PA-C Orthopaedic Surgery 07/22/2015, 8:47 PM

## 2015-07-08 LAB — CBC
HCT: 29.3 % — ABNORMAL LOW (ref 36.0–46.0)
Hemoglobin: 9.7 g/dL — ABNORMAL LOW (ref 12.0–15.0)
MCH: 26.9 pg (ref 26.0–34.0)
MCHC: 33.1 g/dL (ref 30.0–36.0)
MCV: 81.2 fL (ref 78.0–100.0)
Platelets: 307 K/uL (ref 150–400)
RBC: 3.61 MIL/uL — ABNORMAL LOW (ref 3.87–5.11)
RDW: 15.3 % (ref 11.5–15.5)
WBC: 6.9 K/uL (ref 4.0–10.5)

## 2015-07-08 LAB — GLUCOSE, CAPILLARY
GLUCOSE-CAPILLARY: 318 mg/dL — AB (ref 65–99)
Glucose-Capillary: 210 mg/dL — ABNORMAL HIGH (ref 65–99)

## 2015-07-08 LAB — BASIC METABOLIC PANEL WITH GFR
Anion gap: 11 (ref 5–15)
BUN: 10 mg/dL (ref 6–20)
CO2: 21 mmol/L — ABNORMAL LOW (ref 22–32)
Calcium: 9.2 mg/dL (ref 8.9–10.3)
Chloride: 102 mmol/L (ref 101–111)
Creatinine, Ser: 0.66 mg/dL (ref 0.44–1.00)
GFR calc Af Amer: >60 mL/min
GFR calc non Af Amer: >60 mL/min
Glucose, Bld: 231 mg/dL — ABNORMAL HIGH (ref 65–99)
Potassium: 4.7 mmol/L (ref 3.5–5.1)
Sodium: 134 mmol/L — ABNORMAL LOW (ref 135–145)

## 2015-07-08 NOTE — Progress Notes (Signed)
Occupational Therapy Treatment Patient Details Name: [name] MRN: [id number] DOB: 03-31-1946 Today's Date: 07/08/2015    History of present illness 70 yo female s/p L TKA 07/06/15. R TKA 2016   OT comments  Pt participated in ADL retraining session today for toilet and shower transfer x2 today w/ vc for RW positioning and anterior/posterior approach with close supervision. Handout provided and reviewed with pt. Discussed and educated pt in use of 3:1 in shower at home as pt has larger walk in shower w/ double doors and small built in seat.    Follow Up Recommendations  No OT follow up;Supervision - Intermittent    Equipment Recommendations  None recommended by OT    Recommendations for Other Services      Precautions / Restrictions Precautions Precautions: Knee;Fall Required Braces or Orthoses: Knee Immobilizer - Left Knee Immobilizer - Left: Discontinue once straight leg raise with < 10 degree lag Restrictions Weight Bearing Restrictions: Yes LLE Weight Bearing: Weight bearing as tolerated       Mobility Bed Mobility Overal bed mobility:  (Pt up in chair upon OT arrival this morning)                Transfers Overall transfer level: Needs assistance Equipment used: Rolling walker (2 wheeled) Transfers: Sit to/from [facility] Sit to Stand: Supervision Stand pivot transfers: Supervision       General transfer comment: No physical assist, increased time and vc's for safety/technique    Balance Overall balance assessment: Needs assistance Sitting-balance support: No upper extremity supported;Feet supported Sitting balance-Leahy Scale: Good     Standing balance support: Bilateral upper extremity supported;During functional activity Standing balance-Leahy Scale: Fair                     ADL Overall ADL's : Needs assistance/impaired                         Toilet Transfer: Supervision/safety;Ambulation;RW;BSC Toilet  Transfer Details (indicate cue type and reason): VC's for RW positioning Toileting- Clothing Manipulation and Hygiene: Modified independent;Sitting/lateral lean   Tub/ Shower Transfer: Walk-in shower;Supervision/safety;Anterior/posterior;Ambulation;Rolling walker Tub/Shower Transfer Details (indicate cue type and reason): Shower transfer x2 today w/ vc for RW positioning and anterior/posterior approach. Handout provided and reviewed with pt. Discussed and educated pt in use of 3:1 in shower at home.  Functional mobility during ADLs: Supervision/safety;Cueing for safety;Rolling walker (Increased time) General ADL Comments: Pt participated in ADL retraining session today for toilet and shower transfer x2 today w/ vc for RW positioning and anterior/posterior approach. Handout provided and reviewed with pt. Discussed and educated pt in use of 3:1 in shower at home as pt has larger walk in w/ double doors on shower at home and small built in seat.       Vision  No change from baseline                   Perception     Praxis      Cognition   Behavior During Therapy: WFL for tasks assessed/performed Overall Cognitive Status: Within Functional Limits for tasks assessed                       Extremity/Trunk Assessment               Exercises     Shoulder Instructions       General Comments      Pertinent  Vitals/ Pain       Pain Assessment: 0-10 Pain Score: 5  Pain Location: L knee Pain Descriptors / Indicators: Sore;Aching Pain Intervention(s): Monitored during session;Repositioned;Limited activity within patient's tolerance  Home Living                                          Prior Functioning/Environment              Frequency Min 2X/week     Progress Toward Goals  OT Goals(current goals can now be found in the care plan section)  Progress towards OT goals: Progressing toward goals  Acute Rehab OT Goals Patient Stated Goal:  Go home later today  Plan Discharge plan remains appropriate    Co-evaluation                 End of Session Equipment Utilized During Treatment: Gait belt;Rolling walker   Activity Tolerance Patient tolerated treatment well   Patient Left in chair;with call bell/phone within reach   Nurse Communication          Time: [id number] OT Time Calculation (min): 21 min  Charges: OT General Charges $OT Visit: 1 Procedure OT Treatments $Self Care/Home Management : 8-22 mins  Raianna Slight Beth Dixon, OTR/L 07/08/2015, 8:32 AM

## 2015-07-08 NOTE — Progress Notes (Signed)
Patient verbalized understanding of discharge instructions. Patient is stable at discharge. 

## 2015-07-08 NOTE — Progress Notes (Signed)
Physical Therapy Treatment Patient Details Name: [name] MRN: [id number] DOB: 08-11-1945 Today's Date: 07/08/2015    History of Present Illness 70 yo female s/p L TKA 07/06/15. R TKA 2016    PT Comments    POD # 2 Spouse present.  Assisted with stairs and performed TKR TE's following HEP.  Instructed on proper tech and freq plus the use of ICE.    Follow Up Recommendations  Home health PT;Supervision/Assistance - 24 hour     Equipment Recommendations  None recommended by PT    Recommendations for Other Services       Precautions / Restrictions Precautions Precautions: Knee;Fall Precaution Comments: instructed to werar KI for stairs for increased support/safety.   Required Braces or Orthoses: Knee Immobilizer - Left Knee Immobilizer - Left: Discontinue once straight leg raise with < 10 degree lag Restrictions Weight Bearing Restrictions: No LLE Weight Bearing: Weight bearing as tolerated    Mobility  Bed Mobility               General bed mobility comments: Pt OOB in recliner  Transfers Overall transfer level: Needs assistance Equipment used: Rolling walker (2 wheeled) Transfers: Sit to/from Stand Sit to Stand: Supervision Stand pivot transfers: Supervision       General transfer comment: 25% VC's on proper hand placement and increased time  Ambulation/Gait Ambulation/Gait assistance: Supervision;Min guard Ambulation Distance (Feet): 45 Feet Assistive device: Rolling walker (2 wheeled) Gait Pattern/deviations: Step-to pattern Gait velocity: decreased   General Gait Details: close guard for safety. VCs sequence. slow gait speed.    Stairs Stairs: Yes Stairs assistance: Min assist Stair Management: Two rails;Forwards Number of Stairs: 2 General stair comments: 25% VC's on proper sequencing and increased time.   Wheelchair Mobility    Modified Rankin (Stroke Patients Only)       Balance                                     Cognition Arousal/Alertness: Awake/alert Behavior During Therapy: WFL for tasks assessed/performed Overall Cognitive Status: Within Functional Limits for tasks assessed                      Exercises   Total Knee Replacement TE's 10 reps B LE ankle pumps 10 reps towel squeezes 10 reps knee presses 10 reps heel slides  10 reps SAQ's 10 reps SLR's 10 reps ABD Followed by ICE    General Comments        Pertinent Vitals/Pain Pain Assessment: 0-10 Pain Score: 3  Pain Location: L knee Pain Descriptors / Indicators: Aching;Sore Pain Intervention(s): Monitored during session;Repositioned;Premedicated before session;Ice applied    Home Living                      Prior Function            PT Goals (current goals can now be found in the care plan section) Progress towards PT goals: Progressing toward goals    Frequency  7X/week    PT Plan Current plan remains appropriate    Co-evaluation             End of Session Equipment Utilized During Treatment: Gait belt;Left knee immobilizer Activity Tolerance: Patient tolerated treatment well Patient left: in chair;with call bell/phone within reach;with family/visitor present     Time: 1110-1150 PT Time Calculation (min) (ACUTE ONLY): 40 min  Charges:  $Gait Training: 8-22 mins $Therapeutic Exercise: 8-22 mins $Therapeutic Activity: 8-22 mins                    G Codes:      Rica Koyanagi  PTA [facility]  Acute  Rehab Pager      [phone]

## 2015-07-08 NOTE — Progress Notes (Signed)
   Subjective: 2 Days Post-Op Procedure(s) (LRB): LEFT TOTAL KNEE ARTHROPLASTY (Left) Patient reports pain as mild.   Patient seen in rounds with Dr. Wynelle Link. Patient is well, and has had no acute complaints or problems Patient is ready to go home  Objective: Vital signs in last 24 hours: Temp:  [97.8 F (36.6 C)-98.8 F (37.1 C)] 98.8 F (37.1 C) (03/01 0540) Pulse Rate:  [58-80] 80 (03/01 0540) Resp:  [16-18] 18 (03/01 0540) BP: (95-130)/(43-61) 122/55 mmHg (03/01 0540) SpO2:  [96 %-99 %] 96 % (03/01 0540)  Intake/Output from previous day:  Intake/Output Summary (Last 24 hours) at 07/08/15 0715 Last data filed at 07/08/15 0232  Gross per 24 hour  Intake   1200 ml  Output   1925 ml  Net   -725 ml    Intake/Output this shift:    Labs:  Recent Labs  07/07/15 0356 07/08/15 0341  HGB 9.5* 9.7*    Recent Labs  07/07/15 0356 07/08/15 0341  WBC 7.1 6.9  RBC 3.59* 3.61*  HCT 29.3* 29.3*  PLT 270 307    Recent Labs  07/07/15 0356 07/08/15 0341  NA 133* 134*  K 4.9 4.7  CL 100* 102  CO2 28 21*  BUN 9 10  CREATININE 0.65 0.66  GLUCOSE 220* 231*  CALCIUM 8.7* 9.2   No results for input(s): LABPT, INR in the last 72 hours.  EXAM: General - Patient is Alert, Appropriate and Oriented Extremity - Neurovascular intact Sensation intact distally Incision - clean, dry, no drainage Motor Function - intact, moving foot and toes well on exam.   Assessment/Plan: 2 Days Post-Op Procedure(s) (LRB): LEFT TOTAL KNEE ARTHROPLASTY (Left) Procedure(s) (LRB): LEFT TOTAL KNEE ARTHROPLASTY (Left) Past Medical History  Diagnosis Date  . Hypertension   . Hyperlipidemia   . Arthritis   . History of duodenal ulcer 1989  . Stress incontinence   . Anxiety   . Depression   . Bipolar 1 disorder ([place])   . History of breast cancer 1995    lumpectomy and radiation left breast  . Cancer ([place])     hx breast cancer  . Asthma     as a child  . Diabetes mellitus without  complication ([place])     type 2   Principal Problem:   OA (osteoarthritis) of knee  Estimated body mass index is 27.99 kg/(m^2) as calculated from the following:   Height as of this encounter: 5' 4.75" (1.645 m).   Weight as of this encounter: 75.751 kg (167 lb). Up with therapy Discharge home with home health Diet - Cardiac diet and Diabetic diet Follow up - in 2 weeks Activity - WBAT Disposition - Home Condition Upon Discharge - Good D/C Meds - See DC Summary DVT Prophylaxis - Xarelto  Arlee Muslim, PA-C Orthopaedic Surgery 07/08/2015, 7:15 AM

## 2015-07-09 DIAGNOSIS — I10 Essential (primary) hypertension: Secondary | ICD-10-CM | POA: Diagnosis not present

## 2015-07-09 DIAGNOSIS — M1990 Unspecified osteoarthritis, unspecified site: Secondary | ICD-10-CM | POA: Diagnosis not present

## 2015-07-09 DIAGNOSIS — F319 Bipolar disorder, unspecified: Secondary | ICD-10-CM | POA: Diagnosis not present

## 2015-07-09 DIAGNOSIS — F329 Major depressive disorder, single episode, unspecified: Secondary | ICD-10-CM | POA: Diagnosis not present

## 2015-07-09 DIAGNOSIS — Z471 Aftercare following joint replacement surgery: Secondary | ICD-10-CM | POA: Diagnosis not present

## 2015-07-09 DIAGNOSIS — E119 Type 2 diabetes mellitus without complications: Secondary | ICD-10-CM | POA: Diagnosis not present

## 2015-07-10 DIAGNOSIS — E119 Type 2 diabetes mellitus without complications: Secondary | ICD-10-CM | POA: Diagnosis not present

## 2015-07-10 DIAGNOSIS — F319 Bipolar disorder, unspecified: Secondary | ICD-10-CM | POA: Diagnosis not present

## 2015-07-10 DIAGNOSIS — Z471 Aftercare following joint replacement surgery: Secondary | ICD-10-CM | POA: Diagnosis not present

## 2015-07-10 DIAGNOSIS — F329 Major depressive disorder, single episode, unspecified: Secondary | ICD-10-CM | POA: Diagnosis not present

## 2015-07-10 DIAGNOSIS — I10 Essential (primary) hypertension: Secondary | ICD-10-CM | POA: Diagnosis not present

## 2015-07-10 DIAGNOSIS — M1990 Unspecified osteoarthritis, unspecified site: Secondary | ICD-10-CM | POA: Diagnosis not present

## 2015-07-13 DIAGNOSIS — F329 Major depressive disorder, single episode, unspecified: Secondary | ICD-10-CM | POA: Diagnosis not present

## 2015-07-13 DIAGNOSIS — E119 Type 2 diabetes mellitus without complications: Secondary | ICD-10-CM | POA: Diagnosis not present

## 2015-07-13 DIAGNOSIS — M1990 Unspecified osteoarthritis, unspecified site: Secondary | ICD-10-CM | POA: Diagnosis not present

## 2015-07-13 DIAGNOSIS — Z471 Aftercare following joint replacement surgery: Secondary | ICD-10-CM | POA: Diagnosis not present

## 2015-07-13 DIAGNOSIS — F319 Bipolar disorder, unspecified: Secondary | ICD-10-CM | POA: Diagnosis not present

## 2015-07-13 DIAGNOSIS — I10 Essential (primary) hypertension: Secondary | ICD-10-CM | POA: Diagnosis not present

## 2015-07-14 DIAGNOSIS — E1165 Type 2 diabetes mellitus with hyperglycemia: Secondary | ICD-10-CM | POA: Diagnosis not present

## 2015-07-14 DIAGNOSIS — E7800 Pure hypercholesterolemia, unspecified: Secondary | ICD-10-CM | POA: Diagnosis not present

## 2015-07-14 DIAGNOSIS — R809 Proteinuria, unspecified: Secondary | ICD-10-CM | POA: Diagnosis not present

## 2015-07-15 DIAGNOSIS — F329 Major depressive disorder, single episode, unspecified: Secondary | ICD-10-CM | POA: Diagnosis not present

## 2015-07-15 DIAGNOSIS — E119 Type 2 diabetes mellitus without complications: Secondary | ICD-10-CM | POA: Diagnosis not present

## 2015-07-15 DIAGNOSIS — I10 Essential (primary) hypertension: Secondary | ICD-10-CM | POA: Diagnosis not present

## 2015-07-15 DIAGNOSIS — M1990 Unspecified osteoarthritis, unspecified site: Secondary | ICD-10-CM | POA: Diagnosis not present

## 2015-07-15 DIAGNOSIS — Z471 Aftercare following joint replacement surgery: Secondary | ICD-10-CM | POA: Diagnosis not present

## 2015-07-15 DIAGNOSIS — F319 Bipolar disorder, unspecified: Secondary | ICD-10-CM | POA: Diagnosis not present

## 2015-07-16 DIAGNOSIS — F329 Major depressive disorder, single episode, unspecified: Secondary | ICD-10-CM | POA: Diagnosis not present

## 2015-07-16 DIAGNOSIS — F319 Bipolar disorder, unspecified: Secondary | ICD-10-CM | POA: Diagnosis not present

## 2015-07-16 DIAGNOSIS — Z471 Aftercare following joint replacement surgery: Secondary | ICD-10-CM | POA: Diagnosis not present

## 2015-07-16 DIAGNOSIS — E119 Type 2 diabetes mellitus without complications: Secondary | ICD-10-CM | POA: Diagnosis not present

## 2015-07-16 DIAGNOSIS — M1990 Unspecified osteoarthritis, unspecified site: Secondary | ICD-10-CM | POA: Diagnosis not present

## 2015-07-16 DIAGNOSIS — I10 Essential (primary) hypertension: Secondary | ICD-10-CM | POA: Diagnosis not present

## 2015-07-17 DIAGNOSIS — M1712 Unilateral primary osteoarthritis, left knee: Secondary | ICD-10-CM | POA: Diagnosis not present

## 2015-07-21 DIAGNOSIS — Z96652 Presence of left artificial knee joint: Secondary | ICD-10-CM | POA: Diagnosis not present

## 2015-07-21 DIAGNOSIS — Z471 Aftercare following joint replacement surgery: Secondary | ICD-10-CM | POA: Diagnosis not present

## 2015-07-22 DIAGNOSIS — M1712 Unilateral primary osteoarthritis, left knee: Secondary | ICD-10-CM | POA: Diagnosis not present

## 2015-07-23 DIAGNOSIS — I10 Essential (primary) hypertension: Secondary | ICD-10-CM | POA: Diagnosis not present

## 2015-07-23 DIAGNOSIS — R809 Proteinuria, unspecified: Secondary | ICD-10-CM | POA: Diagnosis not present

## 2015-07-23 DIAGNOSIS — E1165 Type 2 diabetes mellitus with hyperglycemia: Secondary | ICD-10-CM | POA: Diagnosis not present

## 2015-07-23 DIAGNOSIS — E7800 Pure hypercholesterolemia, unspecified: Secondary | ICD-10-CM | POA: Diagnosis not present

## 2015-07-24 DIAGNOSIS — M1712 Unilateral primary osteoarthritis, left knee: Secondary | ICD-10-CM | POA: Diagnosis not present

## 2015-07-27 DIAGNOSIS — M1712 Unilateral primary osteoarthritis, left knee: Secondary | ICD-10-CM | POA: Diagnosis not present

## 2015-07-29 DIAGNOSIS — M1712 Unilateral primary osteoarthritis, left knee: Secondary | ICD-10-CM | POA: Diagnosis not present

## 2015-07-31 DIAGNOSIS — M1712 Unilateral primary osteoarthritis, left knee: Secondary | ICD-10-CM | POA: Diagnosis not present

## 2015-08-03 DIAGNOSIS — K7689 Other specified diseases of liver: Secondary | ICD-10-CM | POA: Diagnosis not present

## 2015-08-05 DIAGNOSIS — M1712 Unilateral primary osteoarthritis, left knee: Secondary | ICD-10-CM | POA: Diagnosis not present

## 2015-08-07 DIAGNOSIS — M1712 Unilateral primary osteoarthritis, left knee: Secondary | ICD-10-CM | POA: Diagnosis not present

## 2015-08-11 DIAGNOSIS — Z471 Aftercare following joint replacement surgery: Secondary | ICD-10-CM | POA: Diagnosis not present

## 2015-08-11 DIAGNOSIS — Z96652 Presence of left artificial knee joint: Secondary | ICD-10-CM | POA: Diagnosis not present

## 2015-08-12 DIAGNOSIS — M1712 Unilateral primary osteoarthritis, left knee: Secondary | ICD-10-CM | POA: Diagnosis not present

## 2015-08-14 DIAGNOSIS — M1712 Unilateral primary osteoarthritis, left knee: Secondary | ICD-10-CM | POA: Diagnosis not present

## 2015-08-18 DIAGNOSIS — M1712 Unilateral primary osteoarthritis, left knee: Secondary | ICD-10-CM | POA: Diagnosis not present

## 2015-08-20 DIAGNOSIS — M1712 Unilateral primary osteoarthritis, left knee: Secondary | ICD-10-CM | POA: Diagnosis not present

## 2015-08-25 DIAGNOSIS — M1712 Unilateral primary osteoarthritis, left knee: Secondary | ICD-10-CM | POA: Diagnosis not present

## 2015-08-28 DIAGNOSIS — M1712 Unilateral primary osteoarthritis, left knee: Secondary | ICD-10-CM | POA: Diagnosis not present

## 2015-09-01 DIAGNOSIS — M1712 Unilateral primary osteoarthritis, left knee: Secondary | ICD-10-CM | POA: Diagnosis not present

## 2015-09-04 DIAGNOSIS — M1712 Unilateral primary osteoarthritis, left knee: Secondary | ICD-10-CM | POA: Diagnosis not present

## 2015-10-20 DIAGNOSIS — Z471 Aftercare following joint replacement surgery: Secondary | ICD-10-CM | POA: Diagnosis not present

## 2015-10-20 DIAGNOSIS — Z96652 Presence of left artificial knee joint: Secondary | ICD-10-CM | POA: Diagnosis not present

## 2015-11-24 DIAGNOSIS — I10 Essential (primary) hypertension: Secondary | ICD-10-CM | POA: Diagnosis not present

## 2015-11-24 DIAGNOSIS — E1165 Type 2 diabetes mellitus with hyperglycemia: Secondary | ICD-10-CM | POA: Diagnosis not present

## 2015-11-24 DIAGNOSIS — E7800 Pure hypercholesterolemia, unspecified: Secondary | ICD-10-CM | POA: Diagnosis not present

## 2015-11-24 DIAGNOSIS — R809 Proteinuria, unspecified: Secondary | ICD-10-CM | POA: Diagnosis not present

## 2016-03-18 DIAGNOSIS — R809 Proteinuria, unspecified: Secondary | ICD-10-CM | POA: Diagnosis not present

## 2016-03-18 DIAGNOSIS — E1165 Type 2 diabetes mellitus with hyperglycemia: Secondary | ICD-10-CM | POA: Diagnosis not present

## 2016-03-18 DIAGNOSIS — E7800 Pure hypercholesterolemia, unspecified: Secondary | ICD-10-CM | POA: Diagnosis not present

## 2016-03-25 DIAGNOSIS — R809 Proteinuria, unspecified: Secondary | ICD-10-CM | POA: Diagnosis not present

## 2016-03-25 DIAGNOSIS — E1165 Type 2 diabetes mellitus with hyperglycemia: Secondary | ICD-10-CM | POA: Diagnosis not present

## 2016-03-25 DIAGNOSIS — E7800 Pure hypercholesterolemia, unspecified: Secondary | ICD-10-CM | POA: Diagnosis not present

## 2016-03-25 DIAGNOSIS — I10 Essential (primary) hypertension: Secondary | ICD-10-CM | POA: Diagnosis not present

## 2016-04-06 DIAGNOSIS — D235 Other benign neoplasm of skin of trunk: Secondary | ICD-10-CM | POA: Diagnosis not present

## 2016-04-06 DIAGNOSIS — L814 Other melanin hyperpigmentation: Secondary | ICD-10-CM | POA: Diagnosis not present

## 2016-04-06 DIAGNOSIS — L821 Other seborrheic keratosis: Secondary | ICD-10-CM | POA: Diagnosis not present

## 2016-04-13 DIAGNOSIS — I10 Essential (primary) hypertension: Secondary | ICD-10-CM | POA: Diagnosis not present

## 2016-04-13 DIAGNOSIS — M21379 Foot drop, unspecified foot: Secondary | ICD-10-CM | POA: Diagnosis not present

## 2016-04-13 DIAGNOSIS — E782 Mixed hyperlipidemia: Secondary | ICD-10-CM | POA: Diagnosis not present

## 2016-04-13 DIAGNOSIS — Z7984 Long term (current) use of oral hypoglycemic drugs: Secondary | ICD-10-CM | POA: Diagnosis not present

## 2016-04-13 DIAGNOSIS — E1165 Type 2 diabetes mellitus with hyperglycemia: Secondary | ICD-10-CM | POA: Diagnosis not present

## 2016-04-13 DIAGNOSIS — Z0000 Encounter for general adult medical examination without abnormal findings: Secondary | ICD-10-CM | POA: Diagnosis not present

## 2016-04-13 DIAGNOSIS — F339 Major depressive disorder, recurrent, unspecified: Secondary | ICD-10-CM | POA: Diagnosis not present

## 2016-04-20 DIAGNOSIS — Z6829 Body mass index (BMI) 29.0-29.9, adult: Secondary | ICD-10-CM | POA: Diagnosis not present

## 2016-04-20 DIAGNOSIS — Z01419 Encounter for gynecological examination (general) (routine) without abnormal findings: Secondary | ICD-10-CM | POA: Diagnosis not present

## 2016-04-20 DIAGNOSIS — L723 Sebaceous cyst: Secondary | ICD-10-CM | POA: Diagnosis not present

## 2016-05-11 DIAGNOSIS — E119 Type 2 diabetes mellitus without complications: Secondary | ICD-10-CM | POA: Diagnosis not present

## 2016-05-11 DIAGNOSIS — H5203 Hypermetropia, bilateral: Secondary | ICD-10-CM | POA: Diagnosis not present

## 2016-05-11 DIAGNOSIS — H2513 Age-related nuclear cataract, bilateral: Secondary | ICD-10-CM | POA: Diagnosis not present

## 2016-05-12 ENCOUNTER — Encounter: Payer: Self-pay | Admitting: Neurology

## 2016-05-12 ENCOUNTER — Ambulatory Visit (INDEPENDENT_AMBULATORY_CARE_PROVIDER_SITE_OTHER): Payer: Medicare Other | Admitting: Neurology

## 2016-05-12 DIAGNOSIS — R269 Unspecified abnormalities of gait and mobility: Secondary | ICD-10-CM

## 2016-05-12 DIAGNOSIS — R202 Paresthesia of skin: Secondary | ICD-10-CM | POA: Diagnosis not present

## 2016-05-12 NOTE — Progress Notes (Signed)
PATIENT: [name] DOB: 13-Mar-1946  Chief Complaint  Patient presents with  . Foot Drop    She is here with her husband, [name], to have her right foot drop evaluated.  Symptoms causing her gait difficulty.  Marland Kitchen PCP    Darcus Austin, MD     [place] is a 71 year old right-handed female, accompanied by her husband seen in refer by her primary care physician Dr. Darcus Austin for evaluation of right foot drop, initial evaluation was on May 12 2016.  I reviewed and summarized the referring note, she had a history of type 2 diabetes, hyperlipidemia, osteopenia, history of left breast cancer in 1995, status post lobectomy, followed by radiation therapy, she right knee replacement by Dr.  Maureen Ralphs on July 07 2014, left knee replacement in March 2017, She was diagnosed with paranoid schizophrenia since 1977,  she has been stable on stable dose of Abilify 30mg  daily since 2002, she was treated with Risperidone in the past.  She developed right knee pain in 2006, was evaluated by orthopedic surgeon, was noted to have gait abnormality, was given the diagnosis of right foot drop, but she denies sensory loss of her right foot, denies significant low back pain, over the years, she noticed that she has to purposelly raise her right leg to walk better.  Since summer of 2017, while walking for extended period of time, she noted more difficulty, she complains few years history of urinary incontinence, wearing pads, she denies significant low back pain, neck pain, she does have intermittent bilateral feet paresthesia, denies bilateral upper extremity weakness or numbness,  Because of long-term neuroleptic medication treatment, she developed mild parkinsonian features, right hand tremor, was given the prescription of artane without significant improvement of her symptoms.  Laboratory evaluations, glucose was 118,    REVIEW OF SYSTEMS: Full 14 system review of systems performed and  notable only for fatigue, trouble swallowing, easy bruising, allergy, runny nose, depression anxiety, decreased energy  ALLERGIES: Allergies  Allergen Reactions  . Ciprofloxacin Hcl Rash  . Latex Rash  . Penicillins Rash    Has patient had a PCN reaction causing immediate rash, facial/tongue/throat swelling, SOB or lightheadedness with hypotension: Yes Has patient had a PCN reaction causing severe rash involving mucus membranes or skin necrosis: No Has patient had a PCN reaction that required hospitalization No Has patient had a PCN reaction occurring within the last 10 years: Yes If all of the above answers are "NO", then may proceed with Cephalosporin use.  . Sulfa Antibiotics Rash    HOME MEDICATIONS: Current Outpatient Prescriptions  Medication Sig Dispense Refill  . ARIPiprazole (ABILIFY) 30 MG tablet Take 30 mg by mouth at bedtime.    Marland Kitchen aspirin 81 MG chewable tablet Chew by mouth daily.    . clonazePAM (KLONOPIN) 0.5 MG tablet Taking two tablets at bedtime and one additional tablet daily, if needed.    . DiphenhydrAMINE HCl (BENADRYL PO) Take by mouth. Takes as needed for allergies.    . Diphenhydramine-Pseudoephed (BENADRYL ALLERGY/SINUS PO) Take by mouth.    . insulin lispro protamine-lispro (HUMALOG 75/25 MIX) (75-25) 100 UNIT/ML SUSP injection Inject 12-14 Units into the skin 2 (two) times daily. Sliding scale depending on time of day and what eating.    . iron polysaccharides (NIFEREX) 150 MG capsule Take 1 capsule (150 mg total) by mouth daily. Take for three weeks and then discontinue. 21 capsule 0  . metFORMIN (GLUCOPHAGE-XR) 500 MG 24 hr tablet  Take 1,000 mg by mouth 2 (two) times daily.  5  . methocarbamol (ROBAXIN) 500 MG tablet Take 1 tablet (500 mg total) by mouth every 6 (six) hours as needed for muscle spasms. 80 tablet 0  . pravastatin (PRAVACHOL) 20 MG tablet Take 20 mg by mouth daily after breakfast.    . ramipril (ALTACE) 5 MG capsule Take 5 mg by mouth at  bedtime.    . sitaGLIPtin (JANUVIA) 100 MG tablet Take 100 mg by mouth daily with breakfast.    . traMADol (ULTRAM) 50 MG tablet Take 1-2 tablets (50-100 mg total) by mouth every 6 (six) hours as needed (mild pain). 80 tablet 1   No current facility-administered medications for this visit.     PAST MEDICAL HISTORY: Past Medical History:  Diagnosis Date  . Anxiety   . Arthritis   . Asthma    as a child  . Bipolar 1 disorder ([place])   . Cancer ([place])    hx breast cancer  . Depression   . Diabetes mellitus without complication ([place])    type 2  . Foot drop   . History of breast cancer 1995   lumpectomy and radiation left breast  . History of duodenal ulcer 1989  . Hyperlipidemia   . Hypertension   . Stress incontinence     PAST SURGICAL HISTORY: Past Surgical History:  Procedure Laterality Date  . BREAST LUMPECTOMY  1995   left breast / radiation  . DILATION AND CURETTAGE OF UTERUS  1989  . JOINT REPLACEMENT    . KNEE ARTHROSCOPY     rt knee  . TONSILLECTOMY    . TOTAL KNEE ARTHROPLASTY Right 07/07/2014   Procedure: RIGHT TOTAL KNEE ARTHROPLASTY;  Surgeon: [name], MD;  Location: [facility];  Service: Orthopedics;  Laterality: Right;  . TOTAL KNEE ARTHROPLASTY Left 07/06/2015   Procedure: LEFT TOTAL KNEE ARTHROPLASTY;  Surgeon: Gaynelle Arabian, MD;  Location: [facility];  Service: Orthopedics;  Laterality: Left;    FAMILY HISTORY: Family History  Problem Relation Age of Onset  . Schizophrenia Mother   . Prostate cancer Father   . Aneurysm Father     stomach  . Diabetes Father   . Colitis Paternal Grandmother     SOCIAL HISTORY:  Social History   Social History  . Marital status: Married    Spouse name: N/A  . Number of children: 1  . Years of education: Associates   Occupational History  . Retired    Social History Main Topics  . Smoking status: Never Smoker  . Smokeless tobacco: Never Used  . Alcohol use Yes     Comment: occasional  . Drug use: No  .  Sexual activity: Not on file   Other Topics Concern  . Not on file   Social History Narrative   Lives at home with husband.   Right-handed.   3-4 cups caffeine.     PHYSICAL EXAM   Vitals:   05/12/16 1021  BP: 121/69  Pulse: 66  Weight: 173 lb (78.5 kg)  Height: 5' 4.75" (1.645 m)    Not recorded      Body mass index is 29.01 kg/m.  PHYSICAL EXAMNIATION:  Gen: NAD, conversant, well nourised, obese, well groomed                     Cardiovascular: Regular rate rhythm, no peripheral edema, warm, nontender. Eyes: Conjunctivae clear without exudates or hemorrhage Neck: Supple, no carotid bruits. Pulmonary:  Clear to auscultation bilaterally   NEUROLOGICAL EXAM:  MENTAL STATUS: Speech:    Speech is normal; fluent and spontaneous with normal comprehension.  Cognition:     Orientation to time, place and person     Normal recent and remote memory     Normal Attention span and concentration     Normal Language, naming, repeating,spontaneous speech     Fund of knowledge   CRANIAL NERVES: CN II: Visual fields are full to confrontation. Fundoscopic exam is normal with sharp discs and no vascular changes. Pupils are round equal and briskly reactive to light. CN III, IV, VI: extraocular movement are normal. No ptosis. CN V: Facial sensation is intact to pinprick in all 3 divisions bilaterally. Corneal responses are intact.  CN VII: Face is symmetric with normal eye closure and smile. CN VIII: Hearing is normal to rubbing fingers CN IX, X: Palate elevates symmetrically. Phonation is normal. CN XI: Head turning and shoulder shrug are intact CN XII: Tongue is midline with normal movements and no atrophy.  MOTOR: She has mild bilateral upper extremity rigidity, slight right ankle dorsiflexion weakness,  REFLEXES: Reflexes are 3 and symmetric at the biceps, triceps, knees, and ankles. Plantar responses are flexor.  SENSORY: Intact to light touch, pinprick, positional  sensation and vibratory sensation are intact in fingers and toes.  COORDINATION: Rapid alternating movements and fine finger movements are intact. There is no dysmetria on finger-to-nose and heel-knee-shin.    GAIT/STANCE: She walked by raising her right leg, cautious, mildly unsteady   DIAGNOSTIC DATA (LABS, IMAGING, TESTING) - I reviewed patient records, labs, notes, testing and imaging myself where available.   ASSESSMENT AND PLAN  Beatrice DONITA LINZER is a 71 y.o. female   Gait abnormality, bilateral feet paresthesia, slight right ankle dorsiflexion weakness,  She has hyperreflexia of both upper and lower extremity,  Potential localized to cervical, proceed with MRI of cervical  EMG nerve conduction study   Marcial Pacas, M.D. Ph.D.  [facility] [address], [place], [place] [address] Ph: [phone] Fax: [phone]  CC: Darcus Austin, MD

## 2016-05-16 DIAGNOSIS — Z1231 Encounter for screening mammogram for malignant neoplasm of breast: Secondary | ICD-10-CM | POA: Diagnosis not present

## 2016-05-16 DIAGNOSIS — N958 Other specified menopausal and perimenopausal disorders: Secondary | ICD-10-CM | POA: Diagnosis not present

## 2016-05-27 ENCOUNTER — Ambulatory Visit
Admission: RE | Admit: 2016-05-27 | Discharge: 2016-05-27 | Disposition: A | Discharge status: Home / Self Care | Payer: Medicare Other | Source: Ambulatory Visit | Attending: Neurology | Admitting: Neurology

## 2016-05-27 DIAGNOSIS — R269 Unspecified abnormalities of gait and mobility: Secondary | ICD-10-CM

## 2016-05-27 DIAGNOSIS — R202 Paresthesia of skin: Secondary | ICD-10-CM

## 2016-05-27 DIAGNOSIS — M4802 Spinal stenosis, cervical region: Secondary | ICD-10-CM | POA: Diagnosis not present

## 2016-05-30 ENCOUNTER — Telehealth: Payer: Self-pay | Admitting: Neurology

## 2016-05-30 NOTE — Telephone Encounter (Signed)
I called patient. MRI of the cervical spine shows multilevel degenerative changes, no evidence of nerve root compression or spinal cord compression that would explain hyperreflexia. I discussed this with the patient.  MRI cervical 05/28/16:  IMPRESSION:  This MRI of the cervical spine without contrast shows the following: 1.   At C3-C4 and C4-C5 there is right uncovertebral spurring and right disc protrusion causing right foraminal narrowing but no nerve root compression.  2.   At C5-C6, there is disc bulging and moderate reduced disc height causing mild spinal stenosis at no nerve root compression. 3.   Degenerative changes at C2-C3 and C6-C7 that did not lead to any nerve root compression or spinal stenosis

## 2016-07-01 ENCOUNTER — Ambulatory Visit (INDEPENDENT_AMBULATORY_CARE_PROVIDER_SITE_OTHER): Payer: Medicare Other | Admitting: Neurology

## 2016-07-01 ENCOUNTER — Other Ambulatory Visit: Payer: Self-pay | Admitting: Neurology

## 2016-07-01 ENCOUNTER — Encounter (INDEPENDENT_AMBULATORY_CARE_PROVIDER_SITE_OTHER): Payer: Self-pay

## 2016-07-01 DIAGNOSIS — M545 Low back pain, unspecified: Secondary | ICD-10-CM | POA: Insufficient documentation

## 2016-07-01 DIAGNOSIS — G8929 Other chronic pain: Secondary | ICD-10-CM

## 2016-07-01 DIAGNOSIS — R269 Unspecified abnormalities of gait and mobility: Secondary | ICD-10-CM | POA: Diagnosis not present

## 2016-07-01 DIAGNOSIS — R202 Paresthesia of skin: Secondary | ICD-10-CM | POA: Diagnosis not present

## 2016-07-01 DIAGNOSIS — Z0289 Encounter for other administrative examinations: Secondary | ICD-10-CM

## 2016-07-01 NOTE — Progress Notes (Signed)
PATIENT: [name] DOB: 1945/11/11  No chief complaint on file.    HISTORICAL  [name] is a 71 year old right-handed female, accompanied by her husband seen in refer by her primary care physician Dr. Darcus Austin for evaluation of right foot drop, initial evaluation was on May 12 2016.  I reviewed and summarized the referring note, she had a history of type 2 diabetes, hyperlipidemia, osteopenia, history of left breast cancer in 1995, status post lobectomy, followed by radiation therapy, she right knee replacement by Dr.  Maureen Ralphs on July 07 2014, left knee replacement in March 2017, She was diagnosed with paranoid schizophrenia since 1977,  she has been stable on stable dose of Abilify 30mg  daily since 2002, she was treated with Risperidone in the past.  She developed right knee pain in 2006, was evaluated by orthopedic surgeon, was noted to have gait abnormality, was given the diagnosis of right foot drop, but she denies sensory loss of her right foot, denies significant low back pain, over the years, she noticed that she has to purposelly raise her right leg to walk better.  Since summer of 2017, while walking for extended period of time, she noted more difficulty, she complains few years history of urinary incontinence, wearing pads, she denies significant low back pain, neck pain, she does have intermittent bilateral feet paresthesia, denies bilateral upper extremity weakness or numbness,  Because of long-term neuroleptic medication treatment, she developed mild parkinsonian features, right hand tremor, was given the prescription of artane without significant improvement of her symptoms.  Laboratory evaluations, glucose was 118,    Update July 01 2016: She came in for electrodiagnostic study today, which showed no significant abnormality, in specific, there is no evidence of large fiber peripheral neuropathy or bilateral lumbosacral radiculopathy.  Today she  complains of low back pain, unsteady gait, intermittent right hand tremor,  We have personally reviewed MRI of the cervical in February 2018, multilevel degenerative disc disease no significant canal or foraminal stenosis.  REVIEW OF SYSTEMS: Full 14 system review of systems performed and notable only for as above  ALLERGIES: Allergies  Allergen Reactions  . Ciprofloxacin Hcl Rash  . Latex Rash  . Penicillins Rash    Has patient had a PCN reaction causing immediate rash, facial/tongue/throat swelling, SOB or lightheadedness with hypotension: Yes Has patient had a PCN reaction causing severe rash involving mucus membranes or skin necrosis: No Has patient had a PCN reaction that required hospitalization No Has patient had a PCN reaction occurring within the last 10 years: Yes If all of the above answers are "NO", then may proceed with Cephalosporin use.  . Sulfa Antibiotics Rash    HOME MEDICATIONS: Current Outpatient Prescriptions  Medication Sig Dispense Refill  . ARIPiprazole (ABILIFY) 30 MG tablet Take 30 mg by mouth at bedtime.    Marland Kitchen aspirin 81 MG chewable tablet Chew by mouth daily.    . clonazePAM (KLONOPIN) 0.5 MG tablet Taking two tablets at bedtime and one additional tablet daily, if needed.    . DiphenhydrAMINE HCl (BENADRYL PO) Take by mouth. Takes as needed for allergies.    . Diphenhydramine-Pseudoephed (BENADRYL ALLERGY/SINUS PO) Take by mouth.    . insulin lispro protamine-lispro (HUMALOG 75/25 MIX) (75-25) 100 UNIT/ML SUSP injection Inject 12-14 Units into the skin 2 (two) times daily. Sliding scale depending on time of day and what eating.    . iron polysaccharides (NIFEREX) 150 MG capsule Take 1 capsule (150 mg total) by mouth  daily. Take for three weeks and then discontinue. 21 capsule 0  . metFORMIN (GLUCOPHAGE-XR) 500 MG 24 hr tablet Take 1,000 mg by mouth 2 (two) times daily.  5  . methocarbamol (ROBAXIN) 500 MG tablet Take 1 tablet (500 mg total) by mouth every 6  (six) hours as needed for muscle spasms. 80 tablet 0  . pravastatin (PRAVACHOL) 20 MG tablet Take 20 mg by mouth daily after breakfast.    . ramipril (ALTACE) 5 MG capsule Take 5 mg by mouth at bedtime.    . sitaGLIPtin (JANUVIA) 100 MG tablet Take 100 mg by mouth daily with breakfast.    . traMADol (ULTRAM) 50 MG tablet Take 1-2 tablets (50-100 mg total) by mouth every 6 (six) hours as needed (mild pain). 80 tablet 1   No current facility-administered medications for this visit.     PAST MEDICAL HISTORY: Past Medical History:  Diagnosis Date  . Anxiety   . Arthritis   . Asthma    as a child  . Bipolar 1 disorder ([place])   . Cancer ([place])    hx breast cancer  . Depression   . Diabetes mellitus without complication ([place])    type 2  . Foot drop   . History of breast cancer 1995   lumpectomy and radiation left breast  . History of duodenal ulcer 1989  . Hyperlipidemia   . Hypertension   . Stress incontinence     PAST SURGICAL HISTORY: Past Surgical History:  Procedure Laterality Date  . BREAST LUMPECTOMY  1995   left breast / radiation  . DILATION AND CURETTAGE OF UTERUS  1989  . JOINT REPLACEMENT    . KNEE ARTHROSCOPY     rt knee  . TONSILLECTOMY    . TOTAL KNEE ARTHROPLASTY Right 07/07/2014   Procedure: RIGHT TOTAL KNEE ARTHROPLASTY;  Surgeon: [name], MD;  Location[facility];  Service: Orthopedics;  Laterality: Right;  . TOTAL KNEE ARTHROPLASTY Left 07/06/2015   Procedure: LEFT TOTAL KNEE ARTHROPLASTY;  Surgeon: Gaynelle Arabian, MD;  Location: [facility];  Service: Orthopedics;  Laterality: Left;    FAMILY HISTORY: Family History  Problem Relation Age of Onset  . Schizophrenia Mother   . Prostate cancer Father   . Aneurysm Father     stomach  . Diabetes Father   . Colitis Paternal Grandmother     SOCIAL HISTORY:  Social History   Social History  . Marital status: Married    Spouse name: N/A  . Number of children: 1  . Years of education: Associates    Occupational History  . Retired    Social History Main Topics  . Smoking status: Never Smoker  . Smokeless tobacco: Never Used  . Alcohol use Yes     Comment: occasional  . Drug use: No  . Sexual activity: Not on file   Other Topics Concern  . Not on file   Social History Narrative   Lives at home with husband.   Right-handed.   3-4 cups caffeine.     PHYSICAL EXAM   There were no vitals filed for this visit.  Not recorded      There is no height or weight on file to calculate BMI.  PHYSICAL EXAMNIATION:  Gen: NAD, conversant, well nourised, obese, well groomed                     Cardiovascular: Regular rate rhythm, no peripheral edema, warm, nontender. Eyes: Conjunctivae clear without exudates  or hemorrhage Neck: Supple, no carotid bruits. Pulmonary: Clear to auscultation bilaterally   NEUROLOGICAL EXAM:  MENTAL STATUS: Speech:    Speech is normal; fluent and spontaneous with normal comprehension.  Cognition:     Orientation to time, place and person     Normal recent and remote memory     Normal Attention span and concentration     Normal Language, naming, repeating,spontaneous speech     Fund of knowledge   CRANIAL NERVES: CN II: Visual fields are full to confrontation. Fundoscopic exam is normal with sharp discs and no vascular changes. Pupils are round equal and briskly reactive to light. CN III, IV, VI: extraocular movement are normal. No ptosis. CN V: Facial sensation is intact to pinprick in all 3 divisions bilaterally. Corneal responses are intact.  CN VII: Face is symmetric with normal eye closure and smile. CN VIII: Hearing is normal to rubbing fingers CN IX, X: Palate elevates symmetrically. Phonation is normal. CN XI: Head turning and shoulder shrug are intact CN XII: Tongue is midline with normal movements and no atrophy.  MOTOR: She has mild bilateral upper extremity rigidity, slight right ankle dorsiflexion  weakness,  REFLEXES: Reflexes are 3 and symmetric at the biceps, triceps, knees, and ankles. Plantar responses are flexor.  SENSORY: Intact to light touch, pinprick, positional sensation and vibratory sensation are intact in fingers and toes.  COORDINATION: Rapid alternating movements and fine finger movements are intact. There is no dysmetria on finger-to-nose and heel-knee-shin.    GAIT/STANCE: Deliberate effort, wide-based, cautious,  DIAGNOSTIC DATA (LABS, IMAGING, TESTING) - I reviewed patient records, labs, notes, testing and imaging myself where available.   ASSESSMENT AND PLAN  [name] is a 71 y.o. female   Gait abnormality  Multifactorial, bilateral knee replacement, joints pain, deconditioning, Low back pain, paresthesia  Electrodiagnostic study showed no evidence of large fiber peripheral neuropathy or bilateral lumbar radiculopathy  Refer her to physical therapy  Return to clinic in 6 months  Marcial Pacas, M.D. Ph.D.  [facility] [address] [place], [place] [address] Ph: [phone] Fax: [phone]  CC: Darcus Austin, MD

## 2016-07-01 NOTE — Progress Notes (Signed)
error 

## 2016-07-01 NOTE — Procedures (Signed)
Full Name: [name] Gender: Female MRN #: [id number] Date of Birth: [birth year]    Visit Date: 07/01/2016 09:00 Age: 71 Years 30 Months Old Examining Physician: Marcial Pacas, MD  Referring Physician: Krista Blue History: 71 year old female, complains of low back pain, gait abnormality  Summary of test:  Nerve conduction study:   Bilateral sural sensory responses were normal. Bilateral superficial peroneal sensory responses showed slightly decreased snap amplitude. Bilateral tibial motor responses, peroneal to EDB responses were normal. Bilateral tibial H reflexes were present and symmetric.  Electromyography: Selected needle examination of bilateral lower extremity muscles and bilateral lumbar sacral paraspinal muscles showed no significant abnormality.  Conclusion: This is a normal study, there is no electrodiagnostic evidence of large fiber peripheral neuropathy or bilateral lumbar sacral radiculopathy.  ------------------------------- Marcial Pacas, M.D.  [facility] [place], [place] [address] Tel: [phone] Fax: [phone]        [facility]    Nerve / Sites Rec. Site Peak Lat Ref. Amp.1-2 Ref. Distance    ms ms V V cm  L Sural - Ankle (Calf)     Calf Ankle 3.91 ?4.40 6.8 ?6.0 14  R Sural - Ankle (Calf)     Calf Ankle 3.80 ?4.40 5.9 ?6.0 14  L Superficial peroneal - Ankle     Lat leg Ankle 4.32 ?4.40 3.6 ?6.0 14  R Superficial peroneal - Ankle     Lat leg Ankle 4.38 ?4.40 3.6 ?6.0 14     [facility]    Nerve / Sites Muscle Latency Ref. Amplitude Ref. Rel Amp Segments Distance Lat Diff Velocity Ref. Area    ms ms mV mV %  cm ms m/s m/s mVms  L Peroneal - EDB     Ankle EDB 6.2 ?6.5 4.0 ?2.0 100 Ankle - EDB 9    9.7     Fib head EDB 11.1  3.4  86.9 Fib head - Ankle 26 4.9 53 ?44 10.8     Pop fossa EDB 13.3  3.4  99.2 Pop fossa - Fib head 10 2.1 47 ?44 11.7         Pop fossa - Ankle  7.1     R Peroneal - EDB     Ankle EDB 5.3 ?6.5 4.4 ?2.0 100  Ankle - EDB 9    13.6     Fib head EDB 11.2  4.0  91.3 Fib head - Ankle 26 5.9 44 ?44 13.2     Pop fossa EDB 13.4  3.9  97.6 Pop fossa - Fib head 10 2.2 45 ?44 13.3         Pop fossa - Ankle  8.1     L Tibial - AH     Ankle AH 4.1 ?5.8 9.4 ?4.0 100 Ankle - AH 9    26.2     Pop fossa AH 13.8  6.3  67 Pop fossa - Ankle 41 9.7 42 ?41 24.2  R Tibial - AH     Ankle AH 4.6 ?5.8 9.0 ?4.0 100 Ankle - AH 9    21.8     Pop fossa AH 14.4  5.5  61 Pop fossa - Ankle 40 9.7 41 ?41 18.3     F  Wave    Nerve F Lat Ref.   ms ms  L Tibial - AH 59.0 ?56.0  R Tibial - AH 60.4 ?56.0     H Reflex    Nerve H Lat Lat Hmax  ms ms   Left Right Ref. Left Right Ref.  Tibial - [place] 40.3 40.8 ?35.0 41.1 41.1 ?35.0     EMG full    Muscle       R GENERIC MUSCLE 2.1     EMG Summary Table    Spontaneous MUAP Recruitment  Muscle IA Fib PSW Fasc Other Amp Dur. Poly Pattern  L. Tibialis anterior Normal None None None _______ Normal Normal Normal Normal  L. Tibialis posterior Normal None None None _______ Normal Normal Normal Normal  L. Peroneus longus Normal None None None _______ Normal Normal Normal Normal  L. Gastrocnemius (Medial head) Normal None None None _______ Normal Normal Normal Normal  R. Vastus lateralis Normal None None None _______ Normal Normal Normal Normal  L. Vastus lateralis Normal None None None _______ Normal Normal Normal Normal  R. Tibialis anterior Normal None None None _______ Normal Normal Normal Normal  R. Tibialis posterior Normal None None None _______ Normal Normal Normal Normal  R. Gastrocnemius (Medial head) Normal None None None _______ Normal Normal Normal Normal  R. Peroneus longus Normal None None None _______ Normal Normal Normal Normal  R. Thoracic paraspinals Normal None None None _______ Normal Normal Normal Normal  L. Gluteus medius Normal None None None _______ Normal Normal Normal Normal  R. Gluteus medius Normal None None None _______ Normal Normal Normal  Normal  L. Lumbar paraspinals (mid) Normal None None None _______ Normal Normal Normal Normal  L. Lumbar paraspinals (low) Normal None None None _______ Normal Normal Normal Normal  R. Lumbar paraspinals (mid) Normal None None None _______ Normal Normal Normal Normal  R. Lumbar paraspinals (low) Normal None None None _______ Normal Normal Normal Normal

## 2016-07-13 ENCOUNTER — Ambulatory Visit: Payer: Medicare Other | Attending: Neurology | Admitting: Physical Therapy

## 2016-07-13 DIAGNOSIS — R2681 Unsteadiness on feet: Secondary | ICD-10-CM | POA: Insufficient documentation

## 2016-07-13 DIAGNOSIS — R2689 Other abnormalities of gait and mobility: Secondary | ICD-10-CM

## 2016-07-13 NOTE — Therapy (Signed)
[facility] [address] [place], [place], [address] Phone: [phone]   Fax:  [phone]  Physical Therapy Evaluation  Patient Details  Name: [name] MRN: [id number] Date of Birth: 1945/06/08 Referring Provider: Marcial Pacas, MD  Encounter Date: 07/13/2016      PT End of Session - 07/13/16 1358    Visit Number 1   Number of Visits 16   Date for PT Re-Evaluation 09/07/16   Authorization Type Medicare   PT Start Time 1315   PT Stop Time 1356   PT Time Calculation (min) 41 min   Equipment Utilized During Treatment Gait belt   Activity Tolerance Patient tolerated treatment well   Behavior During Therapy [facility] for tasks assessed/performed      Past Medical History:  Diagnosis Date  . Anxiety   . Arthritis   . Asthma    as a child  . Bipolar 1 disorder ([place])   . Cancer ([place])    hx breast cancer  . Depression   . Diabetes mellitus without complication ([place])    type 2  . Foot drop   . History of breast cancer 1995   lumpectomy and radiation left breast  . History of duodenal ulcer 1989  . Hyperlipidemia   . Hypertension   . Stress incontinence     Past Surgical History:  Procedure Laterality Date  . BREAST LUMPECTOMY  1995   left breast / radiation  . DILATION AND CURETTAGE OF UTERUS  1989  . JOINT REPLACEMENT    . KNEE ARTHROSCOPY     rt knee  . TONSILLECTOMY    . TOTAL KNEE ARTHROPLASTY Right 07/07/2014   Procedure: RIGHT TOTAL KNEE ARTHROPLASTY;  Surgeon: [name], MD;  Location: [facility];  Service: Orthopedics;  Laterality: Right;  . TOTAL KNEE ARTHROPLASTY Left 07/06/2015   Procedure: LEFT TOTAL KNEE ARTHROPLASTY;  Surgeon: Gaynelle Arabian, MD;  Location: [facility];  Service: Orthopedics;  Laterality: Left;    There were no vitals filed for this visit.       Subjective Assessment - 07/13/16 1316    Subjective Pt is a 71 y/o female who presents to OPPT for progressive difficulty with  ambulation.  Pt reports R foot drop which progressively got worse after knee replacements.  Pt expresses ~ 13 yr hx of foot drop which is more pronounced following TKAs.    Pertinent History anxiety, OA, bipolar disorder, hx breast cancer, depression, bil TKA (R 2016, L 2017), DM, HTN, HLD   Limitations Walking   How long can you walk comfortably? 30 min   Diagnostic tests MRI c spine: negative; NCV: normal   Patient Stated Goals improve balance and stability   Currently in Pain? No/denies  c/o occasional spasms in back and hip   Effect of Pain on Daily Activities will monitor but will not directly address            [facility] PT Assessment - 07/13/16 1322      Assessment   Medical Diagnosis R20.2 (ICD-10-CM) - Paresthesia   Referring Provider Marcial Pacas, MD   Onset Date/Surgical Date --  2005; exacerbated after TKA   Next MD Visit PRN   Prior Therapy following bil TKA     Precautions   Precautions Fall     Restrictions   Weight Bearing Restrictions No     Balance Screen   Has the patient fallen in the past 6 months No   Has the patient  had a decrease in activity level because of a fear of falling?  Yes   Is the patient reluctant to leave their home because of a fear of falling?  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 walking, riding stationary bike 3x/wk     [employer] Impaired   Memory Impairment Decreased recall of new  information   Behaviors Impulsive     Observation/Other Assessments   Focus on Therapeutic Outcomes (FOTO)  73 (27% limited; predicted 25% limited)   Activities of Balance Confidence Scale (ABC Scale)  89.4% confident     Functional Tests   Functional tests Single leg stance;Other     Single Leg Stance   Comments RLE: 2 sec; LLE: > 10 sec     Other:   Other/ Comments Tandem Stand > 30 sec bil     AROM   Overall AROM Comments bil knees lacking ~ 5-10 degrees extension (R worse than L) affecting gait mechanics     Strength   Overall Strength Comments tested in sitting; suspect hip extension and abduction weakness due to gait abnormalities and difficulty with sit to stand   Strength Assessment Site Hip;Knee;Ankle   Right/Left Hip Right;Left   Right Hip Flexion 3+/5   Left Hip Flexion 3+/5   Right/Left Knee Right;Left   Right Knee Flexion 5/5   Right Knee Extension 5/5   Left Knee Flexion 5/5   Left Knee Extension 5/5   Right/Left Ankle Right;Left   Right Ankle Dorsiflexion 5/5   Left Ankle Dorsiflexion 5/5     Transfers   Five time sit to stand comments  20.63 sec     Ambulation/Gait   Ambulation/Gait Yes   Ambulation/Gait Assistance 5: Supervision   Ambulation Distance (Feet) 200 Feet   Assistive device None   Gait Pattern Trendelenburg;Lateral hip instability;Right foot flat;Left foot flat;Right flexed knee in stance;Left flexed knee in stance;Narrow base of support   Gait velocity 2.81 ft/sec  11.68 sec      Standardized Balance Assessment   Standardized Balance Assessment Dynamic Gait Index;Timed Up and Go Test     Dynamic Gait Index   Level Surface Mild Impairment   Change in Gait Speed Mild Impairment   Gait with Horizontal Head Turns Moderate Impairment   Gait with Vertical Head Turns Mild Impairment   Gait and Pivot Turn Mild Impairment   Step Over Obstacle Mild Impairment   Step Around Obstacles Mild Impairment   Steps Mild Impairment   Total Score 15      Timed Up and Go Test   Normal TUG (seconds) 13.56                           PT Education - 07/13/16 1358    Education provided Yes   Education Details clinical findings, POC, goals of care   Person(s) Educated Patient  Methods Explanation   Comprehension Verbalized understanding          PT Short Term Goals - 07/13/16 1556      PT SHORT TERM GOAL #1   Title verbalize understanding of fall prevention strategies to decrease fall risk (08/10/16)   Time 4   Period Weeks   Status New     PT SHORT TERM GOAL #2   Title improve 5X STS to < 17 sec for improved functional strength (08/10/16)   Time 4   Period Weeks   Status New     PT SHORT TERM GOAL #3   Title improve dynamic gait index to >/= 18/24 for improved mobiltiy (08/10/16)   Time 4   Period Weeks   Status New           PT Long Term Goals - 07/13/16 1558      PT LONG TERM GOAL #1   Title independent with HEP (09/07/16)   Time 8   Period Weeks   Status New     PT LONG TERM GOAL #2   Title improve timed up and go to < 12 sec for improved mobility and decreased fall risk (09/07/16)   Time 8   Period Weeks   Status New     PT LONG TERM GOAL #3   Title improve 5x STS to < 15 sec for improved functional strength (09/07/16)   Time 8   Period Weeks   Status New     PT LONG TERM GOAL #4   Title improve dynamic gait index to >/= 21/24 for decreased fall risk (09/07/16)   Time 8   Period Weeks   Status New     PT LONG TERM GOAL #5   Title amb > 500' on various indoor/outdoor surfaces independently for improved community access (09/07/16)   Time 8   Period Weeks   Status New               Plan - 07/13/16 1551    Clinical Impression Statement Pt is a 71 y/o female who presents to OPPT for moderate complexity PT eval for gait abnormalities and decreased balance.  Pt reports all imaging and NCV negative.  Pt demonstrates functional strength deficits, gait abnormalities, and decreased  balance affecting safe functional mobility.  Pt will benefit from PT to address deficits.   Rehab Potential Good   Clinical Impairments Affecting Rehab Potential multiple comorbidities, ?cognitive deficits   PT Frequency 2x / week   PT Duration 8 weeks   PT Treatment/Interventions ADLs/Self Care Home Management;Cryotherapy;Moist Heat;DME Instruction;Gait training;Stair training;Functional mobility training;Neuromuscular re-education;Balance training;Therapeutic exercise;Therapeutic activities;Patient/family education;Orthotic Fit/Training;Vestibular   PT Next Visit Plan establish HEP for hip strengthening, corner balance exercises, gait training    Consulted and Agree with Plan of Care Patient      Patient will benefit from skilled therapeutic intervention in order to improve the following deficits and impairments:  Decreased strength, Decreased activity tolerance, Decreased cognition, Abnormal gait, Decreased balance, Decreased mobility  Visit Diagnosis: Other abnormalities of gait and mobility - Plan: PT plan of care cert/re-cert  Unsteadiness on feet - Plan: PT plan of care cert/re-cert      G-Codes - 78/58/85 1611    Functional Assessment Tool Used (Outpatient Only) clinical judgement: DGI, 5x STS   Functional Limitation Mobility: Walking and moving around   Mobility: Walking and Moving Around Current Status ([id number]) At least 20 percent but less than 40 percent impaired, limited or restricted   Mobility:  Walking and Moving Around Goal Status [phone]) At least 1 percent but less than 20 percent impaired, limited or restricted       Problem List Patient Active Problem List   Diagnosis Date Noted  . Chronic low back pain 07/01/2016  . Abnormality of gait 05/12/2016  . Paresthesia 05/12/2016  . OA (osteoarthritis) of knee 07/07/2014       Laureen Abrahams, PT, DPT 07/13/16 4:13 PM    [facility] [address]  [place], [place], [address] Phone: [phone]   Fax:  [phone]  Name: [name] MRN: [id number] Date of Birth: 1945-09-27

## 2016-07-18 ENCOUNTER — Ambulatory Visit: Payer: Medicare Other | Admitting: Physical Therapy

## 2016-07-20 ENCOUNTER — Encounter: Payer: Self-pay | Admitting: Physical Therapy

## 2016-07-20 ENCOUNTER — Ambulatory Visit: Payer: Medicare Other | Admitting: Physical Therapy

## 2016-07-20 DIAGNOSIS — R2689 Other abnormalities of gait and mobility: Secondary | ICD-10-CM

## 2016-07-20 DIAGNOSIS — R2681 Unsteadiness on feet: Secondary | ICD-10-CM

## 2016-07-20 NOTE — Therapy (Signed)
[facility] [address] [place], [place], [address] Phone: [phone]   Fax:  [phone]  Physical Therapy Treatment  Patient Details  Name: [name] MRN: [id number] Date of Birth: 12-Jul-1945 Referring Provider: Marcial Pacas, MD  Encounter Date: 07/20/2016      PT End of Session - 07/20/16 1149    Visit Number 2   Number of Visits 16   Date for PT Re-Evaluation 09/07/16   Authorization Type Medicare   PT Start Time 1105   PT Stop Time 1146   PT Time Calculation (min) 41 min   Equipment Utilized During Treatment Gait belt   Activity Tolerance Patient tolerated treatment well   Behavior During Therapy [facility] for tasks assessed/performed      Past Medical History:  Diagnosis Date  . Anxiety   . Arthritis   . Asthma    as a child  . Bipolar 1 disorder ([place])   . Cancer ([place])    hx breast cancer  . Depression   . Diabetes mellitus without complication ([place])    type 2  . Foot drop   . History of breast cancer 1995   lumpectomy and radiation left breast  . History of duodenal ulcer 1989  . Hyperlipidemia   . Hypertension   . Stress incontinence     Past Surgical History:  Procedure Laterality Date  . BREAST LUMPECTOMY  1995   left breast / radiation  . DILATION AND CURETTAGE OF UTERUS  1989  . JOINT REPLACEMENT    . KNEE ARTHROSCOPY     rt knee  . TONSILLECTOMY    . TOTAL KNEE ARTHROPLASTY Right 07/07/2014   Procedure: RIGHT TOTAL KNEE ARTHROPLASTY;  Surgeon: [name], MD;  Location: [facility];  Service: Orthopedics;  Laterality: Right;  . TOTAL KNEE ARTHROPLASTY Left 07/06/2015   Procedure: LEFT TOTAL KNEE ARTHROPLASTY;  Surgeon: Gaynelle Arabian, MD;  Location: [facility];  Service: Orthopedics;  Laterality: Left;    There were no vitals filed for this visit.      Subjective Assessment - 07/20/16 1108    Subjective Pt has not done stationary bike this week but usually does 30 min 3x/week and  2x/week goes to group exercises "[employer]", just got busy this week.  Usually sore all over but works it out throughout the day with walking and going up/down stairs.   Pertinent History anxiety, OA, bipolar disorder, hx breast cancer, depression, bil TKA (R 2016, L 2017), DM, HTN, HLD   Limitations Walking   How long can you walk comfortably? 30 min   Diagnostic tests MRI c spine: negative; NCV: normal   Patient Stated Goals improve balance and stability   Currently in Pain? No/denies                         [facility] Adult PT Treatment/Exercise - 07/20/16 0001      Ambulation/Gait   Ambulation/Gait Yes   Ambulation/Gait Assistance 5: Supervision;4: Min guard   Ambulation/Gait Assistance Details working on heel-toe gait patterm   Ambulation Distance (Feet) 100 Feet   Assistive device None   Gait Pattern Trendelenburg;Lateral hip instability;Right foot flat;Left foot flat;Right flexed knee in stance;Left flexed knee in stance;Narrow base of support             Balance Exercises - 07/20/16 1316      Balance Exercises: Standing   Standing Eyes Opened Narrow base of support (BOS);Head turns  no UE support   Standing Eyes Closed Narrow base of support (BOS);Head turns  intermittent UE suppport   Marching Limitations 2x10, stiffness noted in hamtrings and hip flexors R>L        Heel Raises Limitations heel/toe raises x10 with UE support   Other Standing Exercises alternate hamstring curls 2x10 with light UE support working towards SLS                     PT Education - 07/20/16 1124    Education provided Yes   Education Details Initiated HEP for standing corner balance exercises, see pt instruction; encouraged pt to continue with using stationary bike and going to group exercise classes   Person(s) Educated Patient   Methods Explanation;Demonstration;Verbal cues;Handout   Comprehension Verbalized understanding;Returned demonstration;Verbal cues  required;Need further instruction          PT Short Term Goals - 07/13/16 1556      PT SHORT TERM GOAL #1   Title verbalize understanding of fall prevention strategies to decrease fall risk (08/10/16)   Time 4   Period Weeks   Status New     PT SHORT TERM GOAL #2   Title improve 5X STS to < 17 sec for improved functional strength (08/10/16)   Time 4   Period Weeks   Status New     PT SHORT TERM GOAL #3   Title improve dynamic gait index to >/= 18/24 for improved mobiltiy (08/10/16)   Time 4   Period Weeks   Status New           PT Long Term Goals - 07/13/16 1558      PT LONG TERM GOAL #1   Title independent with HEP (09/07/16)   Time 8   Period Weeks   Status New     PT LONG TERM GOAL #2   Title improve timed up and go to < 12 sec for improved mobility and decreased fall risk (09/07/16)   Time 8   Period Weeks   Status New     PT LONG TERM GOAL #3   Title improve 5x STS to < 15 sec for improved functional strength (09/07/16)   Time 8   Period Weeks   Status New     PT LONG TERM GOAL #4   Title improve dynamic gait index to >/= 21/24 for decreased fall risk (09/07/16)   Time 8   Period Weeks   Status New     PT LONG TERM GOAL #5   Title amb > 500' on various indoor/outdoor surfaces independently for improved community access (09/07/16)   Time 8   Period Weeks   Status New               Plan - 07/20/16 1142    Clinical Impression Statement Initiated balance HEP working on standing with narrow BOS progressing from eyes open to eyes closed with head turns; pt required intermittent UE support.  Pt requires 2 UE support with SLS stance activities and supervision level during gait when practising heel-toe gait pattern.                                                      Rehab Potential Good   Clinical Impairments Affecting Rehab Potential multiple comorbidities, ?cognitive deficits   PT  Frequency 2x / week   PT Duration 8 weeks   PT Treatment/Interventions  ADLs/Self Care Home Management;Cryotherapy;Moist Heat;DME Instruction;Gait training;Stair training;Functional mobility training;Neuromuscular re-education;Balance training;Therapeutic exercise;Therapeutic activities;Patient/family education;Orthotic Fit/Training;Vestibular   PT Next Visit Plan  hip strengthening, review and progress HEP corner balance exercises, gait training       Patient will benefit from skilled therapeutic intervention in order to improve the following deficits and impairments:  Decreased strength, Decreased activity tolerance, Decreased cognition, Abnormal gait, Decreased balance, Decreased mobility  Visit Diagnosis: Other abnormalities of gait and mobility  Unsteadiness on feet     Problem List Patient Active Problem List   Diagnosis Date Noted  . Chronic low back pain 07/01/2016  . Abnormality of gait 05/12/2016  . Paresthesia 05/12/2016  . OA (osteoarthritis) of knee 07/07/2014   Bjorn Loser, PTA  07/20/16, 1:18 PM [facility] [address] [place], [place], [address] Phone: [phone]   Fax:  [phone]  Name: [name] MRN: [id number] Date of Birth: 01-Aug-1945

## 2016-07-20 NOTE — Patient Instructions (Addendum)
Feet Together, Head Motion - Eyes Open    With eyes open, feet together, move head slowly: up and down, side to side, diagonal Repeat __5_ times per session. Do __1-2__ sessions per day.  Copyright  VHI. All rights reserved.   Feet Together, Head Motion - Eyes Closed    With eyes closed and feet together, move head slowly, up and down, side to side, diagonal Repeat _5___ times per session. Do _1-2___ sessions per day. Copyright  VHI. All rights reserved.

## 2016-07-21 ENCOUNTER — Ambulatory Visit: Payer: Medicare Other

## 2016-07-21 DIAGNOSIS — R2689 Other abnormalities of gait and mobility: Secondary | ICD-10-CM

## 2016-07-21 DIAGNOSIS — R2681 Unsteadiness on feet: Secondary | ICD-10-CM

## 2016-07-21 NOTE — Patient Instructions (Addendum)
Perform in a corner with a chair in front of you OR at kitchen sink with a chair behind you for safety:  Feet Together, Head Motion - Eyes Closed    With eyes closed and feet together, move head slowly, up and down, side to side, diagonal Repeat _5___ times per session. Do _1___ sessions per day. Copyright  VHI. All rights reserved.   Feet Together (Compliant Surface) Varied Arm Positions - Eyes Closed    Stand on compliant surface: __pillow/cushion______ with feet together and arms at your side. Close eyes and visualize upright position. Hold__10-30__ seconds. Repeat __3__ times per session. Do __1__ sessions per day.  Copyright  VHI. All rights reserved.    Functional Quadriceps: Sit to Stand    Sit on edge of deep sofa, feet flat on floor. Stand upright, extending knees fully. Then sit slowly back down.  Repeat _10___ times per set. Do __1__ sets per session. Do __1__ sessions per day.  http://orth.exer.us/735   Copyright  VHI. All rights reserved.   ABDUCTION: Standing (Active)    Hold counter. Stand, feet flat. Take half a step back with kicking leg. Then kick leg out to the side.  Perform with both legs. Complete _2__ sets of __10_ repetitions. Perform _3__ sessions per week.  http://gtsc.exer.us/111   Copyright  VHI. All rights reserved.   EXTENSION: Standing (Active)    Hold counter. Stand, both feet flat. Draw right leg behind body as far as possible. Perform with both legs. Complete _2__ sets of _10__ repetitions. Perform __3_ sessions per week.  http://gtsc.exer.us/77   Copyright  VHI. All rights reserved.

## 2016-07-21 NOTE — Therapy (Signed)
[facility] [address] [place], [place], [address] Phone: [phone]   Fax:  [phone]  Physical Therapy Treatment  Patient Details  Name: [name] MRN: [id number] Date of Birth: 06/26/1945 Referring Provider: Marcial Pacas, MD  Encounter Date: 07/21/2016      PT End of Session - 07/21/16 1149    Visit Number 3   Number of Visits 16   Date for PT Re-Evaluation 09/07/16   Authorization Type Medicare   PT Start Time 0933   PT Stop Time 1018   PT Time Calculation (min) 45 min   Equipment Utilized During Treatment --  S prn   Activity Tolerance Patient tolerated treatment well   Behavior During Therapy [facility] for tasks assessed/performed      Past Medical History:  Diagnosis Date  . Anxiety   . Arthritis   . Asthma    as a child  . Bipolar 1 disorder ([place])   . Cancer ([place])    hx breast cancer  . Depression   . Diabetes mellitus without complication ([place])    type 2  . Foot drop   . History of breast cancer 1995   lumpectomy and radiation left breast  . History of duodenal ulcer 1989  . Hyperlipidemia   . Hypertension   . Stress incontinence     Past Surgical History:  Procedure Laterality Date  . BREAST LUMPECTOMY  1995   left breast / radiation  . DILATION AND CURETTAGE OF UTERUS  1989  . JOINT REPLACEMENT    . KNEE ARTHROSCOPY     rt knee  . TONSILLECTOMY    . TOTAL KNEE ARTHROPLASTY Right 07/07/2014   Procedure: RIGHT TOTAL KNEE ARTHROPLASTY;  Surgeon: [name], MD;  Location: [facility];  Service: Orthopedics;  Laterality: Right;  . TOTAL KNEE ARTHROPLASTY Left 07/06/2015   Procedure: LEFT TOTAL KNEE ARTHROPLASTY;  Surgeon: Gaynelle Arabian, MD;  Location: [facility];  Service: Orthopedics;  Laterality: Left;    There were no vitals filed for this visit.      Subjective Assessment - 07/21/16 0936    Subjective Pt denied changes since last visit. Pt reported she was able to use bike for  30 minutes yesterday and reports her buttocks is a little sore. Pt reported she didn't perform balance HEP.   Pertinent History anxiety, OA, bipolar disorder, hx breast cancer, depression, bil TKA (R 2016, L 2017), DM, HTN, HLD   Patient Stated Goals improve balance and stability   Currently in Pain? No/denies        Therex: Pt performed strengthening HEP with cues and demo for technique. Please see pt instructions for details.                       Balance Exercises - 07/21/16 1146      Balance Exercises: Standing   Standing Eyes Opened Narrow base of support (BOS);Wide (BOA);Head turns;Foam/compliant surface;Solid surface;3 reps;30 secs  3 reps/activity   Standing Eyes Closed Narrow base of support (BOS);Head turns;Wide (BOA);Foam/compliant surface;Solid surface;3 reps;10 secs;20 secs  3 reps/activity    See pt instructions for balance HEP details.        PT Education - 07/21/16 1146    Education provided Yes   Education Details PT reviewed previous balance HEP and progressed as tolerated. PT added strengthening exercises to HEP.    Person(s) Educated Patient   Methods Explanation;Demonstration;Verbal cues;Handout   Comprehension Returned demonstration;Verbalized understanding  PT Short Term Goals - 07/13/16 1556      PT SHORT TERM GOAL #1   Title verbalize understanding of fall prevention strategies to decrease fall risk (08/10/16)   Time 4   Period Weeks   Status New     PT SHORT TERM GOAL #2   Title improve 5X STS to < 17 sec for improved functional strength (08/10/16)   Time 4   Period Weeks   Status New     PT SHORT TERM GOAL #3   Title improve dynamic gait index to >/= 18/24 for improved mobiltiy (08/10/16)   Time 4   Period Weeks   Status New           PT Long Term Goals - 07/13/16 1558      PT LONG TERM GOAL #1   Title independent with HEP (09/07/16)   Time 8   Period Weeks   Status New     PT LONG TERM GOAL #2   Title  improve timed up and go to < 12 sec for improved mobility and decreased fall risk (09/07/16)   Time 8   Period Weeks   Status New     PT LONG TERM GOAL #3   Title improve 5x STS to < 15 sec for improved functional strength (09/07/16)   Time 8   Period Weeks   Status New     PT LONG TERM GOAL #4   Title improve dynamic gait index to >/= 21/24 for decreased fall risk (09/07/16)   Time 8   Period Weeks   Status New     PT LONG TERM GOAL #5   Title amb > 500' on various indoor/outdoor surfaces independently for improved community access (09/07/16)   Time 8   Period Weeks   Status New               Plan - 07/21/16 1151    Clinical Impression Statement Pt experienced incr. postural sway during activities which required incr. vestibular input. Pt required cues during strengthening activities to improve technique and eccentric control. Continue with POC.    Rehab Potential Good   Clinical Impairments Affecting Rehab Potential multiple comorbidities, ?cognitive deficits   PT Frequency 2x / week   PT Duration 8 weeks   PT Treatment/Interventions ADLs/Self Care Home Management;Cryotherapy;Moist Heat;DME Instruction;Gait training;Stair training;Functional mobility training;Neuromuscular re-education;Balance training;Therapeutic exercise;Therapeutic activities;Patient/family education;Orthotic Fit/Training;Vestibular   PT Next Visit Plan Review HEP prn, gait training      Patient will benefit from skilled therapeutic intervention in order to improve the following deficits and impairments:  Decreased strength, Decreased activity tolerance, Decreased cognition, Abnormal gait, Decreased balance, Decreased mobility  Visit Diagnosis: Other abnormalities of gait and mobility  Unsteadiness on feet     Problem List Patient Active Problem List   Diagnosis Date Noted  . Chronic low back pain 07/01/2016  . Abnormality of gait 05/12/2016  . Paresthesia 05/12/2016  . OA (osteoarthritis) of  knee 07/07/2014    Jolie Strohecker L 07/21/2016, 11:53 AM  [facility] [address] [place], [place], [address] Phone: [phone]   Fax:  [phone]  Name: CIEARA STIERWALT MRN: [id number] Date of Birth: 1945/06/19  Geoffry Paradise, PT,DPT 07/21/16 11:54 AM Phone: [phone] Fax: [phone]

## 2016-07-22 ENCOUNTER — Ambulatory Visit: Payer: Medicare Other | Admitting: Physical Therapy

## 2016-07-22 DIAGNOSIS — R809 Proteinuria, unspecified: Secondary | ICD-10-CM | POA: Diagnosis not present

## 2016-07-22 DIAGNOSIS — I10 Essential (primary) hypertension: Secondary | ICD-10-CM | POA: Diagnosis not present

## 2016-07-22 DIAGNOSIS — E1165 Type 2 diabetes mellitus with hyperglycemia: Secondary | ICD-10-CM | POA: Diagnosis not present

## 2016-07-22 DIAGNOSIS — E7800 Pure hypercholesterolemia, unspecified: Secondary | ICD-10-CM | POA: Diagnosis not present

## 2016-07-25 ENCOUNTER — Encounter: Payer: Self-pay | Admitting: Physical Therapy

## 2016-07-25 ENCOUNTER — Ambulatory Visit: Payer: Medicare Other | Admitting: Physical Therapy

## 2016-07-25 DIAGNOSIS — R2681 Unsteadiness on feet: Secondary | ICD-10-CM

## 2016-07-25 DIAGNOSIS — R2689 Other abnormalities of gait and mobility: Secondary | ICD-10-CM | POA: Diagnosis not present

## 2016-07-25 NOTE — Therapy (Signed)
[facility] [address] [place], [place], [address] Phone: [phone]   Fax:  [phone]  Physical Therapy Treatment  Patient Details  Name: [name] MRN: [id number] Date of Birth: 04/14/1946 Referring Provider: Marcial Pacas, MD  Encounter Date: 07/25/2016      PT End of Session - 07/25/16 0934    Visit Number 4   Number of Visits 16   Date for PT Re-Evaluation 09/07/16   Authorization Type Medicare   PT Start Time 0932   PT Stop Time 1013   PT Time Calculation (min) 41 min   Equipment Utilized During Treatment Gait belt  S    Activity Tolerance Patient tolerated treatment well   Behavior During Therapy [facility] for tasks assessed/performed      Past Medical History:  Diagnosis Date  . Anxiety   . Arthritis   . Asthma    as a child  . Bipolar 1 disorder ([place])   . Cancer ([place])    hx breast cancer  . Depression   . Diabetes mellitus without complication ([place])    type 2  . Foot drop   . History of breast cancer 1995   lumpectomy and radiation left breast  . History of duodenal ulcer 1989  . Hyperlipidemia   . Hypertension   . Stress incontinence     Past Surgical History:  Procedure Laterality Date  . BREAST LUMPECTOMY  1995   left breast / radiation  . DILATION AND CURETTAGE OF UTERUS  1989  . JOINT REPLACEMENT    . KNEE ARTHROSCOPY     rt knee  . TONSILLECTOMY    . TOTAL KNEE ARTHROPLASTY Right 07/07/2014   Procedure: RIGHT TOTAL KNEE ARTHROPLASTY;  Surgeon: [name], MD;  [facility];  Service: Orthopedics;  Laterality: Right;  . TOTAL KNEE ARTHROPLASTY Left 07/06/2015   Procedure: LEFT TOTAL KNEE ARTHROPLASTY;  Surgeon: Gaynelle Arabian, MD;  [facility];  Service: Orthopedics;  Laterality: Left;    There were no vitals filed for this visit.      Subjective Assessment - 07/25/16 0934    Subjective No new complaints. No falls or pain to report. Doing HEP with "husband  conducting" everything.   Pertinent History anxiety, OA, bipolar disorder, hx breast cancer, depression, bil TKA (R 2016, L 2017), DM, HTN, HLD   Limitations Walking   How long can you walk comfortably? 30 min   Diagnostic tests MRI c spine: negative; NCV: normal   Currently in Pain? No/denies            [facility] Adult PT Treatment/Exercise - 07/25/16 0936      Transfers   Number of Reps 10 reps;2 sets   Comments 1st set: no UE support with cues for full upright posture; 2cd set: with OH press using 2# weighted ball     Ambulation/Gait   Ambulation/Gait Yes   Ambulation/Gait Assistance 5: Supervision   Ambulation/Gait Assistance Details cues for increased step length, increased gait speed. pt did demo good heel>toe step progression   Ambulation Distance (Feet) 450 Feet   Assistive device None   Gait Pattern Trendelenburg;Lateral hip instability;Right flexed knee in stance;Left flexed knee in stance;Narrow base of support;Step-through pattern;Decreased stride length     High Level Balance   High Level Balance Activities Braiding;Tandem walking;Marching forwards;Marching backwards  tandem/toe/heel walking fwd/bwd   High Level Balance Comments on red mats with no UE support: 3 laps each one with min guard to  min assist for balance, cues needed to slow down for more controlled movements, for posture and for weight shifting               Knee/Hip Exercises: Supine   Bridges AROM;Strengthening;Both;1 set;10 reps;Limitations   Bridges Limitations bil leg bridge with 5 sec holds each rep, cues for abd bracing and ex form   Other Supine Knee/Hip Exercises hip abd/ER (clamshell) with green band: 5 sec hold x 10 reps   Other Supine Knee/Hip Exercises sidelying clamshell with green band: 5 sec holds x 10 reps each side, cues on ex form/technique            PT Short Term Goals - 07/13/16 1556      PT SHORT TERM GOAL #1   Title verbalize understanding of fall prevention strategies to  decrease fall risk (08/10/16)   Time 4   Period Weeks   Status New     PT SHORT TERM GOAL #2   Title improve 5X STS to < 17 sec for improved functional strength (08/10/16)   Time 4   Period Weeks   Status New     PT SHORT TERM GOAL #3   Title improve dynamic gait index to >/= 18/24 for improved mobiltiy (08/10/16)   Time 4   Period Weeks   Status New           PT Long Term Goals - 07/13/16 1558      PT LONG TERM GOAL #1   Title independent with HEP (09/07/16)   Time 8   Period Weeks   Status New     PT LONG TERM GOAL #2   Title improve timed up and go to < 12 sec for improved mobility and decreased fall risk (09/07/16)   Time 8   Period Weeks   Status New     PT LONG TERM GOAL #3   Title improve 5x STS to < 15 sec for improved functional strength (09/07/16)   Time 8   Period Weeks   Status New     PT LONG TERM GOAL #4   Title improve dynamic gait index to >/= 21/24 for decreased fall risk (09/07/16)   Time 8   Period Weeks   Status New     PT LONG TERM GOAL #5   Title amb > 500' on various indoor/outdoor surfaces independently for improved community access (09/07/16)   Time 8   Period Weeks   Status New            Plan - 07/25/16 0935    Clinical Impression Statement Today's skilled session continued to focus on gait, LE strengthening and high level balance activities. Pt challenged with balance on compliant surfaces today and continues to need cues for gait deviation correction. Pt is making steady progress toward goals and should benefit from continued PT to progress toward unmet goals .   Rehab Potential Good   Clinical Impairments Affecting Rehab Potential multiple comorbidities, ?cognitive deficits   PT Frequency 2x / week   PT Duration 8 weeks   PT Treatment/Interventions ADLs/Self Care Home Management;Cryotherapy;Moist Heat;DME Instruction;Gait training;Stair training;Functional mobility training;Neuromuscular re-education;Balance training;Therapeutic  exercise;Therapeutic activities;Patient/family education;Orthotic Fit/Training;Vestibular   PT Next Visit Plan gait training, continued to work balance      Patient will benefit from skilled therapeutic intervention in order to improve the following deficits and impairments:  Decreased strength, Decreased activity tolerance, Decreased cognition, Abnormal gait, Decreased balance, Decreased mobility  Visit Diagnosis: Other abnormalities of  gait and mobility  Unsteadiness on feet     Problem List Patient Active Problem List   Diagnosis Date Noted  . Chronic low back pain 07/01/2016  . Abnormality of gait 05/12/2016  . Paresthesia 05/12/2016  . OA (osteoarthritis) of knee 07/07/2014    Willow Ora, PTA, [facility] [address], [place], [place] [address] [phone] 07/25/16, 2:01 PM   Name: [name] MRN: [id number] Date of Birth: 1946-02-27

## 2016-07-29 ENCOUNTER — Encounter: Payer: Self-pay | Admitting: Rehabilitation

## 2016-07-29 ENCOUNTER — Ambulatory Visit: Payer: Medicare Other | Admitting: Rehabilitation

## 2016-07-29 DIAGNOSIS — R2689 Other abnormalities of gait and mobility: Secondary | ICD-10-CM | POA: Diagnosis not present

## 2016-07-29 DIAGNOSIS — R2681 Unsteadiness on feet: Secondary | ICD-10-CM | POA: Diagnosis not present

## 2016-07-29 NOTE — Therapy (Signed)
[facility] [address] [place], [place], [address] Phone: [phone]   Fax:  [phone]  Physical Therapy Treatment  Patient Details  Name: [name] MRN: [id number] Date of Birth: 12-16-1945 Referring Provider: Marcial Pacas, MD  Encounter Date: 07/29/2016      PT End of Session - 07/29/16 1248    Visit Number 5   Number of Visits 16   Date for PT Re-Evaluation 09/07/16   Authorization Type Medicare   PT Start Time 1016   PT Stop Time 1100   PT Time Calculation (min) 44 min   Equipment Utilized During Treatment Gait belt  S    Activity Tolerance Patient tolerated treatment well   Behavior During Therapy [facility] for tasks assessed/performed      Past Medical History:  Diagnosis Date  . Anxiety   . Arthritis   . Asthma    as a child  . Bipolar 1 disorder ([place])   . Cancer ([place])    hx breast cancer  . Depression   . Diabetes mellitus without complication ([place])    type 2  . Foot drop   . History of breast cancer 1995   lumpectomy and radiation left breast  . History of duodenal ulcer 1989  . Hyperlipidemia   . Hypertension   . Stress incontinence     Past Surgical History:  Procedure Laterality Date  . BREAST LUMPECTOMY  1995   left breast / radiation  . DILATION AND CURETTAGE OF UTERUS  1989  . JOINT REPLACEMENT    . KNEE ARTHROSCOPY     rt knee  . TONSILLECTOMY    . TOTAL KNEE ARTHROPLASTY Right 07/07/2014   Procedure: RIGHT TOTAL KNEE ARTHROPLASTY;  Surgeon: [name], MD;  [facility];  Service: Orthopedics;  Laterality: Right;  . TOTAL KNEE ARTHROPLASTY Left 07/06/2015   Procedure: LEFT TOTAL KNEE ARTHROPLASTY;  Surgeon: Gaynelle Arabian, MD;  [facility];  Service: Orthopedics;  Laterality: Left;    There were no vitals filed for this visit.      Subjective Assessment - 07/29/16 1019    Subjective Reports that she is having some either vaginal bleeding or hemrrhoidal  bleeding, MD is supposed to call back.    Pertinent History anxiety, OA, bipolar disorder, hx breast cancer, depression, bil TKA (R 2016, L 2017), DM, HTN, HLD   Limitations Walking   How long can you walk comfortably? 30 min   Diagnostic tests MRI c spine: negative; NCV: normal   Patient Stated Goals improve balance and stability   Currently in Pain? No/denies                         [facility] Adult PT Treatment/Exercise - 07/29/16 1047      Transfers   Number of Reps 1 set;10 reps  from 15" step with foam pad on top   Transfer Cueing without UE support and cues for upright posture.      Self-Care   Self-Care Other Self-Care Comments   Other Self-Care Comments  went over fall prevention strategies with pt.  She is doing most of these at home as she was able to name several during session, see pt instruction for handout.      Neuro Re-ed    Neuro Re-ed Details  High level balance in // bars; standing on foam balance beam, maintaining balance x 2 reps of 30 secs, head turns while on foam  x 10 reps side to side and x 10 reps up/down with intermittent UE support.  Standing with single foot in stance on beam advancing over and back with opposite leg x 10 reps each without UE support, standing in tandem on foam beam maintaining balance x 2 sets of 30 secs (both sides), wall bumps x 10 reps with 5 sec holds-note increased difficulty achieving hip extension and continued cues for forward weight shift.                  PT Education - 07/29/16 1021    Education provided Yes   Education Details fall prevention strategies   Person(s) Educated Patient   Methods Explanation;Handout  please provide handout at next session   Comprehension Verbalized understanding          PT Short Term Goals - 07/13/16 1556      PT SHORT TERM GOAL #1   Title verbalize understanding of fall prevention strategies to decrease fall risk (08/10/16)   Time 4   Period Weeks   Status New      PT SHORT TERM GOAL #2   Title improve 5X STS to < 17 sec for improved functional strength (08/10/16)   Time 4   Period Weeks   Status New     PT SHORT TERM GOAL #3   Title improve dynamic gait index to >/= 18/24 for improved mobiltiy (08/10/16)   Time 4   Period Weeks   Status New           PT Long Term Goals - 07/13/16 1558      PT LONG TERM GOAL #1   Title independent with HEP (09/07/16)   Time 8   Period Weeks   Status New     PT LONG TERM GOAL #2   Title improve timed up and go to < 12 sec for improved mobility and decreased fall risk (09/07/16)   Time 8   Period Weeks   Status New     PT LONG TERM GOAL #3   Title improve 5x STS to < 15 sec for improved functional strength (09/07/16)   Time 8   Period Weeks   Status New     PT LONG TERM GOAL #4   Title improve dynamic gait index to >/= 21/24 for decreased fall risk (09/07/16)   Time 8   Period Weeks   Status New     PT LONG TERM GOAL #5   Title amb > 500' on various indoor/outdoor surfaces independently for improved community access (09/07/16)   Time 8   Period Weeks   Status New               Plan - 07/29/16 1249    Clinical Impression Statement Skilled session focused on fall prevention strategy education as well as continued work with BLE strengthening and high level balance.  Tolerated well, continue to note gait deviations despite cues throughout.    Rehab Potential Good   Clinical Impairments Affecting Rehab Potential multiple comorbidities, ?cognitive deficits   PT Frequency 2x / week   PT Duration 8 weeks   PT Treatment/Interventions ADLs/Self Care Home Management;Cryotherapy;Moist Heat;DME Instruction;Gait training;Stair training;Functional mobility training;Neuromuscular re-education;Balance training;Therapeutic exercise;Therapeutic activities;Patient/family education;Orthotic Fit/Training;Vestibular   PT Next Visit Plan gait training, continued to work balance-give fall prevention handout from last  session Raquel Sarna forgot!   Consulted and Agree with Plan of Care Patient      Patient will benefit from skilled therapeutic intervention in  order to improve the following deficits and impairments:  Decreased strength, Decreased activity tolerance, Decreased cognition, Abnormal gait, Decreased balance, Decreased mobility  Visit Diagnosis: Other abnormalities of gait and mobility  Unsteadiness on feet     Problem List Patient Active Problem List   Diagnosis Date Noted  . Chronic low back pain 07/01/2016  . Abnormality of gait 05/12/2016  . Paresthesia 05/12/2016  . OA (osteoarthritis) of knee 07/07/2014    Cameron Sprang, PT, MPT [facility] [address] [place], [place], [address] Phone: [phone]   Fax:  [phone] 07/29/16, 12:51 PM  Name: [name] MRN: [id number] Date of Birth: 1946-03-17

## 2016-07-29 NOTE — Patient Instructions (Signed)
Fall Prevention in the Home Falls can cause injuries. They can happen to people of all ages. There are many things you can do to make your home safe and to help prevent falls. What can I do on the outside of my home?  Regularly fix the edges of walkways and driveways and fix any cracks.  Remove anything that might make you trip as you walk through a door, such as a raised step or threshold.  Trim any bushes or trees on the path to your home.  Use bright outdoor lighting.  Clear any walking paths of anything that might make someone trip, such as rocks or tools.  Regularly check to see if handrails are loose or broken. Make sure that both sides of any steps have handrails.  Any raised decks and porches should have guardrails on the edges.  Have any leaves, snow, or ice cleared regularly.  Use sand or salt on walking paths during winter.  Clean up any spills in your garage right away. This includes oil or grease spills. What can I do in the bathroom?  Use night lights.  Install grab bars by the toilet and in the tub and shower. Do not use towel bars as grab bars.  Use non-skid mats or decals in the tub or shower.  If you need to sit down in the shower, use a plastic, non-slip stool.  Keep the floor dry. Clean up any water that spills on the floor as soon as it happens.  Remove soap buildup in the tub or shower regularly.  Attach bath mats securely with double-sided non-slip rug tape.  Do not have throw rugs and other things on the floor that can make you trip. What can I do in the bedroom?  Use night lights.  Make sure that you have a light by your bed that is easy to reach.  Do not use any sheets or blankets that are too big for your bed. They should not hang down onto the floor.  Have a firm chair that has side arms. You can use this for support while you get dressed.  Do not have throw rugs and other things on the floor that can make you trip. What can I do in the  kitchen?  Clean up any spills right away.  Avoid walking on wet floors.  Keep items that you use a lot in easy-to-reach places.  If you need to reach something above you, use a strong step stool that has a grab bar.  Keep electrical cords out of the way.  Do not use floor polish or wax that makes floors slippery. If you must use wax, use non-skid floor wax.  Do not have throw rugs and other things on the floor that can make you trip. What can I do with my stairs?  Do not leave any items on the stairs.  Make sure that there are handrails on both sides of the stairs and use them. Fix handrails that are broken or loose. Make sure that handrails are as long as the stairways.  Check any carpeting to make sure that it is firmly attached to the stairs. Fix any carpet that is loose or worn.  Avoid having throw rugs at the top or bottom of the stairs. If you do have throw rugs, attach them to the floor with carpet tape.  Make sure that you have a light switch at the top of the stairs and the bottom of the stairs. If you do   not have them, ask someone to add them for you. What else can I do to help prevent falls?  Wear shoes that:  Do not have high heels.  Have rubber bottoms.  Are comfortable and fit you well.  Are closed at the toe. Do not wear sandals.  If you use a stepladder:  Make sure that it is fully opened. Do not climb a closed stepladder.  Make sure that both sides of the stepladder are locked into place.  Ask someone to hold it for you, if possible.  Clearly mark and make sure that you can see:  Any grab bars or handrails.  First and last steps.  Where the edge of each step is.  Use tools that help you move around (mobility aids) if they are needed. These include:  Canes.  Walkers.  Scooters.  Crutches.  Turn on the lights when you go into a dark area. Replace any light bulbs as soon as they burn out.  Set up your furniture so you have a clear path.  Avoid moving your furniture around.  If any of your floors are uneven, fix them.  If there are any pets around you, be aware of where they are.  Review your medicines with your doctor. Some medicines can make you feel dizzy. This can increase your chance of falling. Ask your doctor what other things that you can do to help prevent falls. This information is not intended to replace advice given to you by your health care provider. Make sure you discuss any questions you have with your health care provider. Document Released: 02/19/2009 Document Revised: 10/01/2015 Document Reviewed: 05/30/2014 Elsevier Interactive Patient Education  2017 [employer]  

## 2016-08-01 ENCOUNTER — Ambulatory Visit: Payer: Medicare Other | Admitting: Physical Therapy

## 2016-08-01 ENCOUNTER — Encounter: Payer: Self-pay | Admitting: Physical Therapy

## 2016-08-01 DIAGNOSIS — R2681 Unsteadiness on feet: Secondary | ICD-10-CM | POA: Diagnosis not present

## 2016-08-01 DIAGNOSIS — R2689 Other abnormalities of gait and mobility: Secondary | ICD-10-CM | POA: Diagnosis not present

## 2016-08-01 DIAGNOSIS — N950 Postmenopausal bleeding: Secondary | ICD-10-CM | POA: Diagnosis not present

## 2016-08-01 NOTE — Therapy (Signed)
[facility] [address] [place], [place], [address] Phone: [phone]   Fax:  [phone]  Physical Therapy Treatment  Patient Details  Name: [name] MRN: [id number] Date of Birth: [birth year] Referring Provider: Marcial Pacas, MD  Encounter Date: 08/01/2016      PT End of Session - 08/01/16 1015    Visit Number 6   Number of Visits 16   Date for PT Re-Evaluation 09/07/16   Authorization Type Medicare   PT Start Time 0934   PT Stop Time 1015   PT Time Calculation (min) 41 min   Equipment Utilized During Treatment Gait belt  S    Activity Tolerance Patient tolerated treatment well   Behavior During Therapy [facility] for tasks assessed/performed      Past Medical History:  Diagnosis Date  . Anxiety   . Arthritis   . Asthma    as a child  . Bipolar 1 disorder ([place])   . Cancer ([place])    hx breast cancer  . Depression   . Diabetes mellitus without complication ([place])    type 2  . Foot drop   . History of breast cancer 1995   lumpectomy and radiation left breast  . History of duodenal ulcer 1989  . Hyperlipidemia   . Hypertension   . Stress incontinence     Past Surgical History:  Procedure Laterality Date  . BREAST LUMPECTOMY  1995   left breast / radiation  . DILATION AND CURETTAGE OF UTERUS  1989  . JOINT REPLACEMENT    . KNEE ARTHROSCOPY     rt knee  . TONSILLECTOMY    . TOTAL KNEE ARTHROPLASTY Right 07/07/2014   Procedure: RIGHT TOTAL KNEE ARTHROPLASTY;  Surgeon: [name], MD;  Location: [facility];  Service: Orthopedics;  Laterality: Right;  . TOTAL KNEE ARTHROPLASTY Left 07/06/2015   Procedure: LEFT TOTAL KNEE ARTHROPLASTY;  Surgeon: Gaynelle Arabian, MD;  Location: [facility];  Service: Orthopedics;  Laterality: Left;    There were no vitals filed for this visit.      Subjective Assessment - 08/01/16 0935    Subjective Pt is going to PCP today due to concerns about hemrroid but bleeding has  stopped. No balance issues.  Reports doing all exercises and stationary bike.   Pertinent History anxiety, OA, bipolar disorder, hx breast cancer, depression, bil TKA (R 2016, L 2017), DM, HTN, HLD   Limitations Walking   How long can you walk comfortably? 30 min   Diagnostic tests MRI c spine: negative; NCV: normal   Patient Stated Goals improve balance and stability   Currently in Pain? No/denies                         [facility] Adult PT Treatment/Exercise - 08/01/16 0001      Ambulation/Gait   Ambulation/Gait Yes   Ambulation/Gait Assistance 5: Supervision   Ambulation/Gait Assistance Details balance training with changes in speed, direction, visual scanning; pt self corrected imbalance   Ambulation Distance (Feet) 600 Feet   Assistive device None   Gait Pattern Trendelenburg;Lateral hip instability;Right flexed knee in stance;Left flexed knee in stance;Narrow base of support;Step-through pattern;Decreased stride length   Ambulation Surface Level;Indoor             Balance Exercises - 08/01/16 0940      Balance Exercises: Standing   SLS Eyes open  Alt tapping cones walking forward, sidestepping with min guard to supervision  Standing, One Foot on a Step Eyes open;6 inch  Alt tapping block, progressing with head turns, supervision   Gait with Head Turns Forward  supervision   Retro Gait 3 reps   Marching Limitations marching forward   supervision with cues for technique           PT Education - 08/01/16 0940    Education provided Yes   Education Details Fall prevention handout   Person(s) Educated Patient   Methods Explanation   Comprehension Verbalized understanding          PT Short Term Goals - 07/13/16 1556      PT SHORT TERM GOAL #1   Title verbalize understanding of fall prevention strategies to decrease fall risk (08/10/16)   Time 4   Period Weeks   Status New     PT SHORT TERM GOAL #2   Title improve 5X STS to < 17 sec for  improved functional strength (08/10/16)   Time 4   Period Weeks   Status New     PT SHORT TERM GOAL #3   Title improve dynamic gait index to >/= 18/24 for improved mobiltiy (08/10/16)   Time 4   Period Weeks   Status New           PT Long Term Goals - 07/13/16 1558      PT LONG TERM GOAL #1   Title independent with HEP (09/07/16)   Time 8   Period Weeks   Status New     PT LONG TERM GOAL #2   Title improve timed up and go to < 12 sec for improved mobility and decreased fall risk (09/07/16)   Time 8   Period Weeks   Status New     PT LONG TERM GOAL #3   Title improve 5x STS to < 15 sec for improved functional strength (09/07/16)   Time 8   Period Weeks   Status New     PT LONG TERM GOAL #4   Title improve dynamic gait index to >/= 21/24 for decreased fall risk (09/07/16)   Time 8   Period Weeks   Status New     PT LONG TERM GOAL #5   Title amb > 500' on various indoor/outdoor surfaces independently for improved community access (09/07/16)   Time 8   Period Weeks   Status New               Plan - 08/01/16 1312    Clinical Impression Statement Pt performed dynamic gait at supervision level, self correcting imbalances but slowing down speed for balance.  Pt performed dynamic SLS activities at min guard to supervision level.  Noted with all multistep tasks, pt required greater time to process/perform commands and greater number of cues.                                                      Rehab Potential Good   Clinical Impairments Affecting Rehab Potential multiple comorbidities, ?cognitive deficits   PT Frequency 2x / week   PT Duration 8 weeks   PT Treatment/Interventions ADLs/Self Care Home Management;Cryotherapy;Moist Heat;DME Instruction;Gait training;Stair training;Functional mobility training;Neuromuscular re-education;Balance training;Therapeutic exercise;Therapeutic activities;Patient/family education;Orthotic Fit/Training;Vestibular   PT Next Visit Plan gait  training, continued to work balance- MULTI tasking or multistep tasks   Consulted and  Agree with Plan of Care Patient      Patient will benefit from skilled therapeutic intervention in order to improve the following deficits and impairments:  Decreased strength, Decreased activity tolerance, Decreased cognition, Abnormal gait, Decreased balance, Decreased mobility  Visit Diagnosis: Other abnormalities of gait and mobility  Unsteadiness on feet     Problem List Patient Active Problem List   Diagnosis Date Noted  . Chronic low back pain 07/01/2016  . Abnormality of gait 05/12/2016  . Paresthesia 05/12/2016  . OA (osteoarthritis) of knee 07/07/2014    Bjorn Loser, PTA  08/01/16, 4:30 PM [facility] [address] [place], [place], [address] Phone: [phone]   Fax:  [phone]  Name: [name] MRN: [id number] Date of Birth: Sep 11, 1945

## 2016-08-01 NOTE — Patient Instructions (Signed)
Fall Prevention in the Home Falls can cause injuries. They can happen to people of all ages. There are many things you can do to make your home safe and to help prevent falls. What can I do on the outside of my home?  Regularly fix the edges of walkways and driveways and fix any cracks.  Remove anything that might make you trip as you walk through a door, such as a raised step or threshold.  Trim any bushes or trees on the path to your home.  Use bright outdoor lighting.  Clear any walking paths of anything that might make someone trip, such as rocks or tools.  Regularly check to see if handrails are loose or broken. Make sure that both sides of any steps have handrails.  Any raised decks and porches should have guardrails on the edges.  Have any leaves, snow, or ice cleared regularly.  Use sand or salt on walking paths during winter.  Clean up any spills in your garage right away. This includes oil or grease spills. What can I do in the bathroom?  Use night lights.  Install grab bars by the toilet and in the tub and shower. Do not use towel bars as grab bars.  Use non-skid mats or decals in the tub or shower.  If you need to sit down in the shower, use a plastic, non-slip stool.  Keep the floor dry. Clean up any water that spills on the floor as soon as it happens.  Remove soap buildup in the tub or shower regularly.  Attach bath mats securely with double-sided non-slip rug tape.  Do not have throw rugs and other things on the floor that can make you trip. What can I do in the bedroom?  Use night lights.  Make sure that you have a light by your bed that is easy to reach.  Do not use any sheets or blankets that are too big for your bed. They should not hang down onto the floor.  Have a firm chair that has side arms. You can use this for support while you get dressed.  Do not have throw rugs and other things on the floor that can make you trip. What can I do in the  kitchen?  Clean up any spills right away.  Avoid walking on wet floors.  Keep items that you use a lot in easy-to-reach places.  If you need to reach something above you, use a strong step stool that has a grab bar.  Keep electrical cords out of the way.  Do not use floor polish or wax that makes floors slippery. If you must use wax, use non-skid floor wax.  Do not have throw rugs and other things on the floor that can make you trip. What can I do with my stairs?  Do not leave any items on the stairs.  Make sure that there are handrails on both sides of the stairs and use them. Fix handrails that are broken or loose. Make sure that handrails are as long as the stairways.  Check any carpeting to make sure that it is firmly attached to the stairs. Fix any carpet that is loose or worn.  Avoid having throw rugs at the top or bottom of the stairs. If you do have throw rugs, attach them to the floor with carpet tape.  Make sure that you have a light switch at the top of the stairs and the bottom of the stairs. If you do   not have them, ask someone to add them for you. What else can I do to help prevent falls?  Wear shoes that:  Do not have high heels.  Have rubber bottoms.  Are comfortable and fit you well.  Are closed at the toe. Do not wear sandals.  If you use a stepladder:  Make sure that it is fully opened. Do not climb a closed stepladder.  Make sure that both sides of the stepladder are locked into place.  Ask someone to hold it for you, if possible.  Clearly mark and make sure that you can see:  Any grab bars or handrails.  First and last steps.  Where the edge of each step is.  Use tools that help you move around (mobility aids) if they are needed. These include:  Canes.  Walkers.  Scooters.  Crutches.  Turn on the lights when you go into a dark area. Replace any light bulbs as soon as they burn out.  Set up your furniture so you have a clear path.  Avoid moving your furniture around.  If any of your floors are uneven, fix them.  If there are any pets around you, be aware of where they are.  Review your medicines with your doctor. Some medicines can make you feel dizzy. This can increase your chance of falling. Ask your doctor what other things that you can do to help prevent falls. This information is not intended to replace advice given to you by your health care provider. Make sure you discuss any questions you have with your health care provider. Document Released: 02/19/2009 Document Revised: 10/01/2015 Document Reviewed: 05/30/2014 Elsevier Interactive Patient Education  2017 [employer]  

## 2016-08-02 ENCOUNTER — Ambulatory Visit: Payer: Medicare Other | Admitting: Physical Therapy

## 2016-08-03 ENCOUNTER — Ambulatory Visit: Payer: Medicare Other | Admitting: Physical Therapy

## 2016-08-03 ENCOUNTER — Encounter: Payer: Self-pay | Admitting: Physical Therapy

## 2016-08-03 DIAGNOSIS — R2681 Unsteadiness on feet: Secondary | ICD-10-CM

## 2016-08-03 DIAGNOSIS — R2689 Other abnormalities of gait and mobility: Secondary | ICD-10-CM

## 2016-08-03 NOTE — Patient Instructions (Addendum)
Perform in a corner with a chair in front of you OR at kitchen sink with a chair behind you for safety: UPDATE 08/03/16  Feet Together (Compliant Surface) Head Motion - Eyes Open    With eyes open, standing on compliant surface: __PILLOW______, feet together, move head slowly: up and down, SIDE TO SIDE, DIAGONAL  Repeat _5-10___ times per session. Do __1__ sessions per day.   Feet Together (Compliant Surface) Varied Arm Positions - Eyes Closed    Stand on compliant surface: __pillow/cushion______ with feet together and ALTERNATE FOLDING ARMS ACROSS CHEST THEN arms at your side. Close eyes and visualize upright position. Hold__10-30__ seconds. Repeat __3__ times per session. Do __1__ sessions per day.  Copyright  VHI. All rights reserved.    Functional Quadriceps: Sit to Stand    Sit on edge of deep sofa, feet flat on floor. CROSS ARMS, Stand upright, extending knees fully. Then sit slowly back down.  Repeat _10___ times per set. Do __1__ sets per session. Do __1__ sessions per day.  http://orth.exer.us/735   Copyright  VHI. All rights reserved.   ABDUCTION: Standing (Active)    Hold counter. Stand, feet flat. FACE COUNTER. Then kick leg out to the side.  Perform with both legs. Complete _2__ sets of __10_ repetitions. Perform _3__ sessions per week.  http://gtsc.exer.us/111   Copyright  VHI. All rights reserved.   EXTENSION: Standing (Active)    FACE COUNTER.Hold counter. Stand, both feet flat. Draw right leg behind body as far as possible. Perform with both legs. Complete _2__ sets of _10__ repetitions. Perform __3_ sessions per week.  http://gtsc.exer.us/77   Copyright  VHI. All rights reserved.   Copyright  VHI. All rights reserved.

## 2016-08-03 NOTE — Therapy (Signed)
[facility] [address] [place], [place], [address] Phone: [phone]   Fax:  [phone]  Physical Therapy Treatment  Patient Details  Name: LUS KRIEGEL MRN: [id number] Date of Birth: 1945/12/09 Referring Provider: Marcial Pacas, MD  Encounter Date: 08/03/2016      PT End of Session - 08/03/16 1100    Visit Number 7   Number of Visits 16   Date for PT Re-Evaluation 09/07/16   Authorization Type Medicare   PT Start Time 1018   PT Stop Time 1056   PT Time Calculation (min) 38 min   Activity Tolerance Patient tolerated treatment well   Behavior During Therapy [facility] for tasks assessed/performed      Past Medical History:  Diagnosis Date  . Anxiety   . Arthritis   . Asthma    as a child  . Bipolar 1 disorder ([place])   . Cancer ([place])    hx breast cancer  . Depression   . Diabetes mellitus without complication ([place])    type 2  . Foot drop   . History of breast cancer 1995   lumpectomy and radiation left breast  . History of duodenal ulcer 1989  . Hyperlipidemia   . Hypertension   . Stress incontinence     Past Surgical History:  Procedure Laterality Date  . BREAST LUMPECTOMY  1995   left breast / radiation  . DILATION AND CURETTAGE OF UTERUS  1989  . JOINT REPLACEMENT    . KNEE ARTHROSCOPY     rt knee  . TONSILLECTOMY    . TOTAL KNEE ARTHROPLASTY Right 07/07/2014   Procedure: RIGHT TOTAL KNEE ARTHROPLASTY;  Surgeon: [name], MD;  [facility];  Service: Orthopedics;  Laterality: Right;  . TOTAL KNEE ARTHROPLASTY Left 07/06/2015   Procedure: LEFT TOTAL KNEE ARTHROPLASTY;  Surgeon: Gaynelle Arabian, MD;  [facility];  Service: Orthopedics;  Laterality: Left;    There were no vitals filed for this visit.      Subjective Assessment - 08/03/16 1021    Subjective Nothing new to report.  Reports doing all exercises and stationary bike.   Pertinent History anxiety, OA, bipolar disorder, hx  breast cancer, depression, bil TKA (R 2016, L 2017), DM, HTN, HLD   Limitations Walking   How long can you walk comfortably? 30 min   Diagnostic tests MRI c spine: negative; NCV: normal   Patient Stated Goals improve balance and stability   Currently in Pain? No/denies                         [facility] Adult PT Treatment/Exercise - 08/03/16 0001      Knee/Hip Exercises: Standing   Hip ADduction AROM;Strengthening;Both;1 set;10 reps  cues for posture/ body mechanics   Hip Extension AROM;Stengthening;Both;1 set;10 reps  cues for posture/ body mechanics             Balance Exercises - 08/03/16 1044      Balance Exercises: Standing   Standing Eyes Opened Narrow base of support (BOS);Head turns;5 reps;Foam/compliant surface   Standing Eyes Closed Narrow base of support (BOS);Foam/compliant surface  Changing arm postions           PT Education - 08/03/16 1035    Education provided Yes   Education Details Updated HEP progressing balance and reviewing technique to stengthening exercises; see pt instruction.   Person(s) Educated Patient   Methods Explanation;Demonstration;Verbal cues;Handout;Tactile cues   Comprehension  Verbalized understanding;Returned demonstration;Verbal cues required          PT Short Term Goals - 07/13/16 1556      PT SHORT TERM GOAL #1   Title verbalize understanding of fall prevention strategies to decrease fall risk (08/10/16)   Time 4   Period Weeks   Status New     PT SHORT TERM GOAL #2   Title improve 5X STS to < 17 sec for improved functional strength (08/10/16)   Time 4   Period Weeks   Status New     PT SHORT TERM GOAL #3   Title improve dynamic gait index to >/= 18/24 for improved mobiltiy (08/10/16)   Time 4   Period Weeks   Status New           PT Long Term Goals - 07/13/16 1558      PT LONG TERM GOAL #1   Title independent with HEP (09/07/16)   Time 8   Period Weeks   Status New     PT LONG TERM GOAL #2    Title improve timed up and go to < 12 sec for improved mobility and decreased fall risk (09/07/16)   Time 8   Period Weeks   Status New     PT LONG TERM GOAL #3   Title improve 5x STS to < 15 sec for improved functional strength (09/07/16)   Time 8   Period Weeks   Status New     PT LONG TERM GOAL #4   Title improve dynamic gait index to >/= 21/24 for decreased fall risk (09/07/16)   Time 8   Period Weeks   Status New     PT LONG TERM GOAL #5   Title amb > 500' on various indoor/outdoor surfaces independently for improved community access (09/07/16)   Time 8   Period Weeks   Status New               Plan - 08/03/16 1320    Clinical Impression Statement Treatment focused on reviewing and updating HEP.  Pt relies heavily on her husband to coach her through HEP.  Pt required Mod cues for technique and body mechanics with standing LE strengthening.  Balance exercises were upgraded to gradually increase challenge to pt.                                              Rehab Potential Good   Clinical Impairments Affecting Rehab Potential multiple comorbidities, ?cognitive deficits   PT Frequency 2x / week   PT Duration 8 weeks   PT Treatment/Interventions ADLs/Self Care Home Management;Cryotherapy;Moist Heat;DME Instruction;Gait training;Stair training;Functional mobility training;Neuromuscular re-education;Balance training;Therapeutic exercise;Therapeutic activities;Patient/family education;Orthotic Fit/Training;Vestibular   PT Next Visit Plan gait training, continued to work balance- MULTI tasking or multistep tasks   Consulted and Agree with Plan of Care Patient      Patient will benefit from skilled therapeutic intervention in order to improve the following deficits and impairments:  Decreased strength, Decreased activity tolerance, Decreased cognition, Abnormal gait, Decreased balance, Decreased mobility  Visit Diagnosis: Other abnormalities of gait and mobility  Unsteadiness on  feet     Problem List Patient Active Problem List   Diagnosis Date Noted  . Chronic low back pain 07/01/2016  . Abnormality of gait 05/12/2016  . Paresthesia 05/12/2016  . OA (osteoarthritis) of knee 07/07/2014  Bjorn Loser, PTA  08/03/16, 4:34 PM [facility] [address] [place], [place], [address] Phone: [phone]   Fax:  [phone]  Name: DARIANN HUCKABA MRN: [id number] Date of Birth: Dec 01, 1945

## 2016-08-04 DIAGNOSIS — N950 Postmenopausal bleeding: Secondary | ICD-10-CM | POA: Diagnosis not present

## 2016-08-08 ENCOUNTER — Ambulatory Visit: Payer: Medicare Other | Attending: Neurology | Admitting: Physical Therapy

## 2016-08-08 ENCOUNTER — Encounter: Payer: Self-pay | Admitting: Physical Therapy

## 2016-08-08 DIAGNOSIS — R2689 Other abnormalities of gait and mobility: Secondary | ICD-10-CM

## 2016-08-08 DIAGNOSIS — R2681 Unsteadiness on feet: Secondary | ICD-10-CM

## 2016-08-08 NOTE — Therapy (Signed)
[name] [address] [place], [place], [address] Phone: [phone]   Fax:  [phone]  Physical Therapy Treatment  Patient Details  Name: [name] MRN: [id number] Date of Birth: Sep 18, 1945 Referring Provider: Marcial Pacas, MD  Encounter Date: 08/08/2016      PT End of Session - 08/08/16 1024    Visit Number 8   Number of Visits 16   Date for PT Re-Evaluation 09/07/16   Authorization Type Medicare   PT Start Time 1019   PT Stop Time 1058   PT Time Calculation (min) 39 min   Activity Tolerance Patient tolerated treatment well   Behavior During Therapy [facility] for tasks assessed/performed      Past Medical History:  Diagnosis Date  . Anxiety   . Arthritis   . Asthma    as a child  . Bipolar 1 disorder ([place])   . Cancer ([place])    hx breast cancer  . Depression   . Diabetes mellitus without complication ([place])    type 2  . Foot drop   . History of breast cancer 1995   lumpectomy and radiation left breast  . History of duodenal ulcer 1989  . Hyperlipidemia   . Hypertension   . Stress incontinence     Past Surgical History:  Procedure Laterality Date  . BREAST LUMPECTOMY  1995   left breast / radiation  . DILATION AND CURETTAGE OF UTERUS  1989  . JOINT REPLACEMENT    . KNEE ARTHROSCOPY     rt knee  . TONSILLECTOMY    . TOTAL KNEE ARTHROPLASTY Right 07/07/2014   Procedure: RIGHT TOTAL KNEE ARTHROPLASTY;  Surgeon: [name], MD;  [facility];  Service: Orthopedics;  Laterality: Right;  . TOTAL KNEE ARTHROPLASTY Left 07/06/2015   Procedure: LEFT TOTAL KNEE ARTHROPLASTY;  Surgeon: Gaynelle Arabian, MD;  [facility];  Service: Orthopedics;  Laterality: Left;    There were no vitals filed for this visit.      Subjective Assessment - 08/08/16 1021    Subjective No new complaints. No falls or pain to report. Has used bike and walked since last session, has not done HEP due to time  constraints with Easter holiday.    Pertinent History anxiety, OA, bipolar disorder, hx breast cancer, depression, bil TKA (R 2016, L 2017), DM, HTN, HLD   Limitations Walking   How long can you walk comfortably? 30 min   Diagnostic tests MRI c spine: negative; NCV: normal   Patient Stated Goals improve balance and stability   Currently in Pain? No/denies            [facility] PT Assessment - 08/08/16 1028      Transfers   Five time sit to stand comments  12.93  no UE assist     Ambulation/Gait   Ambulation/Gait Yes   Assistive device None     Dynamic Gait Index   Level Surface Normal   Change in Gait Speed Normal   Gait with Horizontal Head Turns Mild Impairment   Gait with Vertical Head Turns Mild Impairment   Gait and Pivot Turn Mild Impairment  4 sec's   Step Over Obstacle Mild Impairment   Step Around Obstacles Normal   Steps Mild Impairment   Total Score 19     Timed Up and Go Test   Normal TUG (seconds) 8.54  no AD           [facility] Adult PT  Treatment/Exercise - 08/08/16 1028      Ambulation/Gait   Ambulation/Gait Assistance 5: Supervision   Ambulation/Gait Assistance Details cues for reciprocal arm swing, heel>toe step progression with gait.   Ambulation Distance (Feet) 750 Feet  x1   Gait Pattern Step-through pattern;Decreased stride length;Decreased step length - right;Decreased arm swing - right;Decreased arm swing - left;Decreased step length - left;Decreased dorsiflexion - right;Decreased dorsiflexion - left;Right flexed knee in stance   Ambulation Surface Level;Indoor;Unlevel;Outdoor;Paved;Gravel;Grass   Gait velocity 9.97 sec= 2.89 ft/sec            PT Short Term Goals - 08/08/16 1024      PT SHORT TERM GOAL #1   Title verbalize understanding of fall prevention strategies to decrease fall risk (08/10/16)   Baseline 08/08/16: met today as pt able to recall fall prevention education provided at previous sessions.   Time --   Period --   Status  Achieved     PT SHORT TERM GOAL #2   Title improve 5X STS to < 17 sec for improved functional strength (08/10/16)   Baseline 08/08/16: 12.93 sec's no UE support   Time --   Period --   Status Achieved     PT SHORT TERM GOAL #3   Title improve dynamic gait index to >/= 18/24 for improved mobiltiy (08/10/16)   Baseline 08/08/16: 19/24 today   Time --   Period --   Status Achieved           PT Long Term Goals - 08/08/16 1030      PT LONG TERM GOAL #1   Title independent with HEP (09/07/16)   Time 8   Period Weeks   Status New     PT LONG TERM GOAL #2   Title improve timed up and go to < 12 sec for improved mobility and decreased fall risk (09/07/16)   Baseline 08/08/16: 8.54 sec's no AD   Time --   Period --   Status Achieved     PT LONG TERM GOAL #3   Title improve 5x STS to < 15 sec for improved functional strength (09/07/16)   Baseline 08/08/16: 12.93 sec's with no UE support.    Status Achieved     PT LONG TERM GOAL #4   Title improve dynamic gait index to >/= 21/24 for decreased fall risk (09/07/16)   Time 8   Period Weeks   Status New     PT LONG TERM GOAL #5   Title amb > 500' on various indoor/outdoor surfaces independently for improved community access (09/07/16)   Time 8   Period Weeks   Status New            Plan - 08/08/16 1024    Clinical Impression Statement Pt has met all STGs and 2 LTGs. Continues to demo deficits with high level  balance and dynmaic gait activities. Pt should benefit from continued PT to progress toward unmet goals and work on continued deficits.    Rehab Potential Good   Clinical Impairments Affecting Rehab Potential multiple comorbidities, ?cognitive deficits   PT Frequency 2x / week   PT Duration 8 weeks   PT Treatment/Interventions ADLs/Self Care Home Management;Cryotherapy;Moist Heat;DME Instruction;Gait training;Stair training;Functional mobility training;Neuromuscular re-education;Balance training;Therapeutic exercise;Therapeutic  activities;Patient/family education;Orthotic Fit/Training;Vestibular   PT Next Visit Plan gait training, continued to work balance- MULTI tasking or multistep tasks   Consulted and Agree with Plan of Care Patient      Patient will benefit from skilled therapeutic  intervention in order to improve the following deficits and impairments:  Decreased strength, Decreased activity tolerance, Decreased cognition, Abnormal gait, Decreased balance, Decreased mobility  Visit Diagnosis: Other abnormalities of gait and mobility  Unsteadiness on feet     Problem List Patient Active Problem List   Diagnosis Date Noted  . Chronic low back pain 07/01/2016  . Abnormality of gait 05/12/2016  . Paresthesia 05/12/2016  . OA (osteoarthritis) of knee 07/07/2014    Willow Ora, PTA, [facility] [address], [place], [place] [address] [phone] 08/08/16, 11:02 AM   Name: LYZETTE REINHARDT MRN: [id number] Date of Birth: Mar 15, 1946

## 2016-08-12 ENCOUNTER — Encounter: Payer: Self-pay | Admitting: Rehabilitation

## 2016-08-12 ENCOUNTER — Ambulatory Visit: Payer: Medicare Other | Admitting: Rehabilitation

## 2016-08-12 DIAGNOSIS — R2689 Other abnormalities of gait and mobility: Secondary | ICD-10-CM | POA: Diagnosis not present

## 2016-08-12 DIAGNOSIS — R2681 Unsteadiness on feet: Secondary | ICD-10-CM | POA: Diagnosis not present

## 2016-08-12 NOTE — Therapy (Signed)
[facility] [address] [place], [place], [address] Phone: [phone]   Fax:  [phone]  Physical Therapy Treatment  Patient Details  Name: [name] MRN: [id number] Date of Birth: 10/25/1945 Referring Provider: Marcial Pacas, MD  Encounter Date: 08/12/2016      PT End of Session - 08/12/16 0937    Visit Number 9   Number of Visits 16   Date for PT Re-Evaluation 09/07/16   Authorization Type Medicare   PT Start Time 0930   PT Stop Time 1014   PT Time Calculation (min) 44 min   Activity Tolerance Patient tolerated treatment well   Behavior During Therapy [facility] for tasks assessed/performed      Past Medical History:  Diagnosis Date  . Anxiety   . Arthritis   . Asthma    as a child  . Bipolar 1 disorder ([place])   . Cancer ([place])    hx breast cancer  . Depression   . Diabetes mellitus without complication ([place])    type 2  . Foot drop   . History of breast cancer 1995   lumpectomy and radiation left breast  . History of duodenal ulcer 1989  . Hyperlipidemia   . Hypertension   . Stress incontinence     Past Surgical History:  Procedure Laterality Date  . BREAST LUMPECTOMY  1995   left breast / radiation  . DILATION AND CURETTAGE OF UTERUS  1989  . JOINT REPLACEMENT    . KNEE ARTHROSCOPY     rt knee  . TONSILLECTOMY    . TOTAL KNEE ARTHROPLASTY Right 07/07/2014   Procedure: RIGHT TOTAL KNEE ARTHROPLASTY;  Surgeon: [name], MD;  Location: [facility];  Service: Orthopedics;  Laterality: Right;  . TOTAL KNEE ARTHROPLASTY Left 07/06/2015   Procedure: LEFT TOTAL KNEE ARTHROPLASTY;  Surgeon: Gaynelle Arabian, MD;  Location: [facility];  Service: Orthopedics;  Laterality: Left;    There were no vitals filed for this visit.      Subjective Assessment - 08/12/16 0935    Subjective No new complaints, no falls.  She reports doing bike but has still not been doing exercises.    Pertinent History anxiety, OA,  bipolar disorder, hx breast cancer, depression, bil TKA (R 2016, L 2017), DM, HTN, HLD   Limitations Walking   How long can you walk comfortably? 30 min   Diagnostic tests MRI c spine: negative; NCV: normal   Patient Stated Goals improve balance and stability   Currently in Pain? No/denies                         [facility] Adult PT Treatment/Exercise - 08/12/16 0001      Neuro Re-ed    Neuro Re-ed Details  High level balance on foam mats with weights underneath to simulate outdoor gait.  Performed 4 laps over and back x 15' then also performed 2 laps down and back with sequence of lateral side stepping over orange barrier and also alternating toe taps.  Requires min/guard throughout but no overt LOB.  Continued with balance on mat on ramp surface while performing staggered stance x 30 secs each way and also marching x 20 reps in place.  Again min/guard to min A note increased difficulty with L LE behind RLE.   Dual task with gait while tossing ball vertically.  Initially requires min/guard progressing to S.       Exercises   Exercises  Other Exercises   Other Exercises  Performed sit<>stand as in HEP.  Note that she is doing very easily from mat, therefore encouraged her to find lower or softer surface at home to increase challenge.                  PT Education - 08/12/16 [phone]    Education provided Yes   Education Details Education on compliance with HEP   Person(s) Educated Patient   Methods Explanation   Comprehension Verbalized understanding          PT Short Term Goals - 08/08/16 1024      PT SHORT TERM GOAL #1   Title verbalize understanding of fall prevention strategies to decrease fall risk (08/10/16)   Baseline 08/08/16: met today as pt able to recall fall prevention education provided at previous sessions.   Time --   Period --   Status Achieved     PT SHORT TERM GOAL #2   Title improve 5X STS to < 17 sec for improved functional strength (08/10/16)    Baseline 08/08/16: 12.93 sec's no UE support   Time --   Period --   Status Achieved     PT SHORT TERM GOAL #3   Title improve dynamic gait index to >/= 18/24 for improved mobiltiy (08/10/16)   Baseline 08/08/16: 19/24 today   Time --   Period --   Status Achieved           PT Long Term Goals - 08/08/16 1030      PT LONG TERM GOAL #1   Title independent with HEP (09/07/16)   Time 8   Period Weeks   Status New     PT LONG TERM GOAL #2   Title improve timed up and go to < 12 sec for improved mobility and decreased fall risk (09/07/16)   Baseline 08/08/16: 8.54 sec's no AD   Time --   Period --   Status Achieved     PT LONG TERM GOAL #3   Title improve 5x STS to < 15 sec for improved functional strength (09/07/16)   Baseline 08/08/16: 12.93 sec's with no UE support.    Status Achieved     PT LONG TERM GOAL #4   Title improve dynamic gait index to >/= 21/24 for decreased fall risk (09/07/16)   Time 8   Period Weeks   Status New     PT LONG TERM GOAL #5   Title amb > 500' on various indoor/outdoor surfaces independently for improved community access (09/07/16)   Time 8   Period Weeks   Status New               Plan - 08/12/16 0814    Clinical Impression Statement Skilled session continues to focus on high level balance on compliant surfaces and SLS. Pt making good progress towards all LTGs.    Rehab Potential Good   Clinical Impairments Affecting Rehab Potential multiple comorbidities, ?cognitive deficits   PT Frequency 2x / week   PT Duration 8 weeks   PT Treatment/Interventions ADLs/Self Care Home Management;Cryotherapy;Moist Heat;DME Instruction;Gait training;Stair training;Functional mobility training;Neuromuscular re-education;Balance training;Therapeutic exercise;Therapeutic activities;Patient/family education;Orthotic Fit/Training;Vestibular   PT Next Visit Plan GCODE/PN!!, gait training, continued to work balance- MULTI tasking or multistep tasks   Consulted and Agree  with Plan of Care Patient      Patient will benefit from skilled therapeutic intervention in order to improve the following deficits and impairments:  Decreased strength, Decreased  activity tolerance, Decreased cognition, Abnormal gait, Decreased balance, Decreased mobility  Visit Diagnosis: Other abnormalities of gait and mobility  Unsteadiness on feet     Problem List Patient Active Problem List   Diagnosis Date Noted  . Chronic low back pain 07/01/2016  . Abnormality of gait 05/12/2016  . Paresthesia 05/12/2016  . OA (osteoarthritis) of knee 07/07/2014    Cameron Sprang, PT, MPT [facility] [address] [place], [place], [address] Phone: [phone]   Fax:  [phone] 08/12/16, 10:22 AM  Name: [name] MRN: [id number] Date of Birth: May 30, 1945

## 2016-08-15 ENCOUNTER — Ambulatory Visit: Payer: Medicare Other | Admitting: Physical Therapy

## 2016-08-15 ENCOUNTER — Encounter: Payer: Self-pay | Admitting: Physical Therapy

## 2016-08-15 DIAGNOSIS — R2681 Unsteadiness on feet: Secondary | ICD-10-CM | POA: Diagnosis not present

## 2016-08-15 DIAGNOSIS — R2689 Other abnormalities of gait and mobility: Secondary | ICD-10-CM

## 2016-08-15 NOTE — Therapy (Signed)
[facility] [address] [place], [place], [address] Phone: [phone]   Fax:  [phone]  Physical Therapy Treatment  Patient Details  Name: EUDELL JULIAN MRN: [id number] Date of Birth: Feb 14, 1946 Referring Provider: Marcial Pacas, MD  Encounter Date: 08/15/2016      PT End of Session - 08/15/16 1015    Visit Number 10   Number of Visits 16   Date for PT Re-Evaluation 09/07/16   Authorization Type Medicare   PT Start Time 0933   PT Stop Time 1011   PT Time Calculation (min) 38 min   Activity Tolerance Patient tolerated treatment well   Behavior During Therapy [facility] for tasks assessed/performed      Past Medical History:  Diagnosis Date  . Anxiety   . Arthritis   . Asthma    as a child  . Bipolar 1 disorder ([place])   . Cancer ([place])    hx breast cancer  . Depression   . Diabetes mellitus without complication ([place])    type 2  . Foot drop   . History of breast cancer 1995   lumpectomy and radiation left breast  . History of duodenal ulcer 1989  . Hyperlipidemia   . Hypertension   . Stress incontinence     Past Surgical History:  Procedure Laterality Date  . BREAST LUMPECTOMY  1995   left breast / radiation  . DILATION AND CURETTAGE OF UTERUS  1989  . JOINT REPLACEMENT    . KNEE ARTHROSCOPY     rt knee  . TONSILLECTOMY    . TOTAL KNEE ARTHROPLASTY Right 07/07/2014   Procedure: RIGHT TOTAL KNEE ARTHROPLASTY;  Surgeon: [name], MD;  [facility];  Service: Orthopedics;  Laterality: Right;  . TOTAL KNEE ARTHROPLASTY Left 07/06/2015   Procedure: LEFT TOTAL KNEE ARTHROPLASTY;  Surgeon: Gaynelle Arabian, MD;  [facility];  Service: Orthopedics;  Laterality: Left;    There were no vitals filed for this visit.      Subjective Assessment - 08/15/16 0936    Subjective Did HEP since last visit with husband.   Pertinent History anxiety, OA, bipolar disorder, hx breast cancer, depression, bil TKA  (R 2016, L 2017), DM, HTN, HLD   Limitations Walking   How long can you walk comfortably? 30 min   Diagnostic tests MRI c spine: negative; NCV: normal   Patient Stated Goals improve balance and stability   Currently in Pain? No/denies                         [facility] Adult PT Treatment/Exercise - 08/15/16 0001      Transfers   Five time sit to stand comments  13.57     Ambulation/Gait   Ambulation/Gait Yes   Ambulation/Gait Assistance 5: Supervision   Ambulation/Gait Assistance Details cues for reciprocal arm, working on increasing gait speed.   Ambulation Distance (Feet) 250 Feet   Assistive device None   Gait Pattern Step-through pattern;Decreased stride length;Decreased step length - right;Decreased arm swing - right;Decreased arm swing - left;Decreased step length - left;Decreased dorsiflexion - right;Decreased dorsiflexion - left;Right flexed knee in stance   Ambulation Surface Level;Indoor             Balance Exercises - 08/15/16 0944      Balance Exercises: Standing   Standing Eyes Opened Narrow base of support (BOS);Head turns  + head nods, diagonals, + upper trunk rotations, min to no  UE support   Stepping Strategy Anterior;Lateral;Posterior;Foam/compliant surface  STanding on foam stepping to balance disks, intermittent UE              PT Short Term Goals - 08/08/16 1024      PT SHORT TERM GOAL #1   Title verbalize understanding of fall prevention strategies to decrease fall risk (08/10/16)   Baseline 08/08/16: met today as pt able to recall fall prevention education provided at previous sessions.   Time --   Period --   Status Achieved     PT SHORT TERM GOAL #2   Title improve 5X STS to < 17 sec for improved functional strength (08/10/16)   Baseline 08/08/16: 12.93 sec's no UE support   Time --   Period --   Status Achieved     PT SHORT TERM GOAL #3   Title improve dynamic gait index to >/= 18/24 for improved mobiltiy (08/10/16)   Baseline  08/08/16: 19/24 today   Time --   Period --   Status Achieved           PT Long Term Goals - 08/08/16 1030      PT LONG TERM GOAL #1   Title independent with HEP (09/07/16)   Time 8   Period Weeks   Status New     PT LONG TERM GOAL #2   Title improve timed up and go to < 12 sec for improved mobility and decreased fall risk (09/07/16)   Baseline 08/08/16: 8.54 sec's no AD   Time --   Period --   Status Achieved     PT LONG TERM GOAL #3   Title improve 5x STS to < 15 sec for improved functional strength (09/07/16)   Baseline 08/08/16: 12.93 sec's with no UE support.    Status Achieved     PT LONG TERM GOAL #4   Title improve dynamic gait index to >/= 21/24 for decreased fall risk (09/07/16)   Time 8   Period Weeks   Status New     PT LONG TERM GOAL #5   Title amb > 500' on various indoor/outdoor surfaces independently for improved community access (09/07/16)   Time 8   Period Weeks   Status New               Plan - 08-16-2016 0454    Clinical Impression Statement Pt demonstrated gradual progress with understanding HEP and  standing balance with feet together on compliant surface (eyes open) requiring little to no UE support and cues to perform. Stepping strategies from one compliant surface to another; pt required intermittent UE suppport.    Rehab Potential Good   Clinical Impairments Affecting Rehab Potential multiple comorbidities, ?cognitive deficits   PT Frequency 2x / week   PT Duration 8 weeks   PT Treatment/Interventions ADLs/Self Care Home Management;Cryotherapy;Moist Heat;DME Instruction;Gait training;Stair training;Functional mobility training;Neuromuscular re-education;Balance training;Therapeutic exercise;Therapeutic activities;Patient/family education;Orthotic Fit/Training;Vestibular   PT Next Visit Plan check for knee pain during eccentric contractions, gait training, continued to work balance- MULTI tasking or multistep tasks   Consulted and Agree with Plan of  Care Patient      Patient will benefit from skilled therapeutic intervention in order to improve the following deficits and impairments:  Decreased strength, Decreased activity tolerance, Decreased cognition, Abnormal gait, Decreased balance, Decreased mobility  Visit Diagnosis: Other abnormalities of gait and mobility  Unsteadiness on feet       G-Codes - 2016-08-16 0950    Functional Assessment  Tool Used (Outpatient Only) clinical judgement: DGI 19/24, 5x STS 13.57sec.   Mobility: Walking and Moving Around Current Status [phone]) At least 20 percent but less than 40 percent impaired, limited or restricted   Mobility: Walking and Moving Around Goal Status [phone]) At least 1 percent but less than 20 percent impaired, limited or restricted      Physical Therapy Progress Note  Dates of Reporting Period: 07/13/16 to 08/15/16  Objective Reports of Subjective Statement: See above  Objective Measurements: See G code above  Goal Update: Continue progress towards LTGs  Plan: Continue current POC  Reason Skilled Services are Required: Pt continues to have high level balance, strength and endurance deficits that are being addressed in therapy.       G code and PN entered by:  Cameron Sprang, PT, MPT [facility] [address] [place], [place], [address] Phone: [phone]   Fax:  [phone] 08/15/16, 12:17 PM    Problem List Patient Active Problem List   Diagnosis Date Noted  . Chronic low back pain 07/01/2016  . Abnormality of gait 05/12/2016  . Paresthesia 05/12/2016  . OA (osteoarthritis) of knee 07/07/2014   Bjorn Loser, PTA  08/15/16, 12:16 PM [facility] [address] [place], [place], [address] Phone: [phone]   Fax:  [phone]  Name: BEE MARCHIANO MRN: [id number] Date of Birth: [birth year]

## 2016-08-19 ENCOUNTER — Encounter: Payer: Self-pay | Admitting: Rehabilitation

## 2016-08-19 ENCOUNTER — Ambulatory Visit: Payer: Medicare Other | Admitting: Rehabilitation

## 2016-08-19 ENCOUNTER — Ambulatory Visit: Payer: Medicare Other | Admitting: Physical Therapy

## 2016-08-19 DIAGNOSIS — R2689 Other abnormalities of gait and mobility: Secondary | ICD-10-CM

## 2016-08-19 DIAGNOSIS — R2681 Unsteadiness on feet: Secondary | ICD-10-CM

## 2016-08-19 NOTE — Therapy (Signed)
[facility] [address] [place], [place], [address] Phone: [phone]   Fax:  [phone]  Physical Therapy Treatment  Patient Details  Name: [name] MRN: [id number] Date of Birth: [birth year] Referring Provider: Marcial Pacas, MD  Encounter Date: 08/19/2016      PT End of Session - 08/19/16 0930    Visit Number 11   Number of Visits 16   Date for PT Re-Evaluation 09/07/16   Authorization Type Medicare   PT Start Time 0925   PT Stop Time 1010   PT Time Calculation (min) 45 min   Activity Tolerance Patient tolerated treatment well   Behavior During Therapy [facility] for tasks assessed/performed      Past Medical History:  Diagnosis Date  . Anxiety   . Arthritis   . Asthma    as a child  . Bipolar 1 disorder ([place])   . Cancer ([place])    hx breast cancer  . Depression   . Diabetes mellitus without complication ([place])    type 2  . Foot drop   . History of breast cancer 1995   lumpectomy and radiation left breast  . History of duodenal ulcer 1989  . Hyperlipidemia   . Hypertension   . Stress incontinence     Past Surgical History:  Procedure Laterality Date  . BREAST LUMPECTOMY  1995   left breast / radiation  . DILATION AND CURETTAGE OF UTERUS  1989  . JOINT REPLACEMENT    . KNEE ARTHROSCOPY     rt knee  . TONSILLECTOMY    . TOTAL KNEE ARTHROPLASTY Right 07/07/2014   Procedure: RIGHT TOTAL KNEE ARTHROPLASTY;  Surgeon: [name], MD;  [facility];  Service: Orthopedics;  Laterality: Right;  . TOTAL KNEE ARTHROPLASTY Left 07/06/2015   Procedure: LEFT TOTAL KNEE ARTHROPLASTY;  Surgeon: Gaynelle Arabian, MD;  [facility];  Service: Orthopedics;  Laterality: Left;    There were no vitals filed for this visit.      Subjective Assessment - 08/19/16 0929    Subjective Reports to this PT that she did exercises one day this week and rode her stationary bike two days for 30 mins.    Pertinent  History anxiety, OA, bipolar disorder, hx breast cancer, depression, bil TKA (R 2016, L 2017), DM, HTN, HLD   Limitations Walking   How long can you walk comfortably? 30 min   Diagnostic tests MRI c spine: negative; NCV: normal   Patient Stated Goals improve balance and stability   Currently in Pain? No/denies                         [facility] Adult PT Treatment/Exercise - 08/19/16 0001      Ambulation/Gait   Ambulation/Gait Yes   Ambulation/Gait Assistance 5: Supervision;4: Min assist   Ambulation/Gait Assistance Details Assessed gait over outdoor paved, unlevel and grassy surfaces.  Note that over paved surfaces at S level, however on grassy surfaces recommend min/guard to close S as she did demonstrate a few LOB, but was mostly able to self correct.    Ambulation Distance (Feet) 300 Feet   Assistive device None   Gait Pattern Step-through pattern;Decreased stride length;Decreased step length - right;Decreased arm swing - right;Decreased arm swing - left;Decreased step length - left;Decreased dorsiflexion - right;Decreased dorsiflexion - left;Right flexed knee in stance   Ambulation Surface Level;Unlevel;Indoor;Outdoor;Paved;Gravel;Grass     Neuro Re-ed    Neuro Re-ed Details  Continue to work on high level balance with ankle and hip strategy in // bars;  standing on BOSU ball blue top up with R LE instance moving LLE over and back with light single UE support x 10 and vice versa.  Then turned BOSU ball with black top up with pt standing shoulder width apart maintaining balance x 2 sets of 30 secs progressing to moving ball in clockwise pattern x 10 reps and counter clock wise x 10 reps.  Went over corner balance tasks while standing on pillow with feet together EC x 30 secs>feet together EC with head turns x 10 reps side to side and x 10 reps up/down.  Also had her perform alternating arm raises with head turns x 10 reps.  Tolerated all well.       Exercises   Other Exercises   Had pt perform light step ups from 2" step dropping only heel to ground before returning to stance.  x  10 reps with cues for increase push from leg rather than using UEs.                 PT Education - 08/19/16 0930    Education provided Yes   Education Details recommendation for S with outdoor gait.    Person(s) Educated Patient   Methods Explanation   Comprehension Verbalized understanding          PT Short Term Goals - 08/08/16 1024      PT SHORT TERM GOAL #1   Title verbalize understanding of fall prevention strategies to decrease fall risk (08/10/16)   Baseline 08/08/16: met today as pt able to recall fall prevention education provided at previous sessions.   Time --   Period --   Status Achieved     PT SHORT TERM GOAL #2   Title improve 5X STS to < 17 sec for improved functional strength (08/10/16)   Baseline 08/08/16: 12.93 sec's no UE support   Time --   Period --   Status Achieved     PT SHORT TERM GOAL #3   Title improve dynamic gait index to >/= 18/24 for improved mobiltiy (08/10/16)   Baseline 08/08/16: 19/24 today   Time --   Period --   Status Achieved           PT Long Term Goals - 08/08/16 1030      PT LONG TERM GOAL #1   Title independent with HEP (09/07/16)   Time 8   Period Weeks   Status New     PT LONG TERM GOAL #2   Title improve timed up and go to < 12 sec for improved mobility and decreased fall risk (09/07/16)   Baseline 08/08/16: 8.54 sec's no AD   Time --   Period --   Status Achieved     PT LONG TERM GOAL #3   Title improve 5x STS to < 15 sec for improved functional strength (09/07/16)   Baseline 08/08/16: 12.93 sec's with no UE support.    Status Achieved     PT LONG TERM GOAL #4   Title improve dynamic gait index to >/= 21/24 for decreased fall risk (09/07/16)   Time 8   Period Weeks   Status New     PT LONG TERM GOAL #5   Title amb > 500' on various indoor/outdoor surfaces independently for improved community access (09/07/16)    Time 8   Period Weeks   Status New  Plan - 08/19/16 0931    Clinical Impression Statement Skilled session continues to focus on high level balance with vestibular challenges, on compliant surfaces and outdoor gait.  She requires min/guard to min A over grassy surfaces, therefore recommend at least S outdoors at home.  Will continue to address.    Rehab Potential Good   Clinical Impairments Affecting Rehab Potential multiple comorbidities, ?cognitive deficits   PT Frequency 2x / week   PT Duration 8 weeks   PT Treatment/Interventions ADLs/Self Care Home Management;Cryotherapy;Moist Heat;DME Instruction;Gait training;Stair training;Functional mobility training;Neuromuscular re-education;Balance training;Therapeutic exercise;Therapeutic activities;Patient/family education;Orthotic Fit/Training;Vestibular   PT Next Visit Plan outdoor gait over grass, high level balance, SLS, gait with dual task.    Consulted and Agree with Plan of Care Patient      Patient will benefit from skilled therapeutic intervention in order to improve the following deficits and impairments:  Decreased strength, Decreased activity tolerance, Decreased cognition, Abnormal gait, Decreased balance, Decreased mobility  Visit Diagnosis: Other abnormalities of gait and mobility  Unsteadiness on feet     Problem List Patient Active Problem List   Diagnosis Date Noted  . Chronic low back pain 07/01/2016  . Abnormality of gait 05/12/2016  . Paresthesia 05/12/2016  . OA (osteoarthritis) of knee 07/07/2014    Cameron Sprang, PT, MPT [facility] [address] [place], [place], [address] Phone: [phone]   Fax:  [phone] 08/19/16, 10:16 AM  Name: NAFEESA DILS MRN: [id number] Date of Birth: 22-May-1945

## 2016-08-22 ENCOUNTER — Ambulatory Visit: Payer: Medicare Other | Admitting: Physical Therapy

## 2016-08-26 ENCOUNTER — Encounter: Payer: Self-pay | Admitting: Rehabilitation

## 2016-08-26 ENCOUNTER — Ambulatory Visit: Payer: Medicare Other | Admitting: Rehabilitation

## 2016-08-26 DIAGNOSIS — R2689 Other abnormalities of gait and mobility: Secondary | ICD-10-CM

## 2016-08-26 DIAGNOSIS — R2681 Unsteadiness on feet: Secondary | ICD-10-CM | POA: Diagnosis not present

## 2016-08-26 NOTE — Patient Instructions (Signed)
Perform in a corner with a chair in front of you OR at kitchen sink with a chair behind you for safety: UPDATE 08/03/16  Feet Together (Compliant Surface) Head Motion - Eyes Open    With eyes open, standing on compliant surface: __PILLOW______, feet together, move head slowly: up and down, SIDE TO SIDE, DIAGONAL  Repeat _5-10___ times per session. Do __1__ sessions per day.   Feet Together (Compliant Surface) Varied Arm Positions - Eyes Closed    Stand on compliant surface: __pillow/cushion______ with feet together and ALTERNATE FOLDING ARMS ACROSS CHEST THEN arms at your side. Close eyes and visualize upright position. Hold__10-30__ seconds. Repeat __3__ times per session. Do __1__ sessions per day.

## 2016-08-26 NOTE — Therapy (Signed)
[facility] [address] [place], [place], [address] Phone: [phone]   Fax:  [phone]  Physical Therapy Treatment  Patient Details  Name: [name] MRN: [id number] Date of Birth: 23-Jun-1945 Referring Provider: Marcial Pacas, MD  Encounter Date: 08/26/2016      PT End of Session - 08/26/16 0936    Visit Number 12   Number of Visits 16   Date for PT Re-Evaluation 09/07/16   Authorization Type Medicare   PT Start Time 0932   PT Stop Time 1014   PT Time Calculation (min) 42 min   Activity Tolerance Patient tolerated treatment well   Behavior During Therapy [facility] for tasks assessed/performed      Past Medical History:  Diagnosis Date  . Anxiety   . Arthritis   . Asthma    as a child  . Bipolar 1 disorder ([place])   . Cancer ([place])    hx breast cancer  . Depression   . Diabetes mellitus without complication ([place])    type 2  . Foot drop   . History of breast cancer 1995   lumpectomy and radiation left breast  . History of duodenal ulcer 1989  . Hyperlipidemia   . Hypertension   . Stress incontinence     Past Surgical History:  Procedure Laterality Date  . BREAST LUMPECTOMY  1995   left breast / radiation  . DILATION AND CURETTAGE OF UTERUS  1989  . JOINT REPLACEMENT    . KNEE ARTHROSCOPY     rt knee  . TONSILLECTOMY    . TOTAL KNEE ARTHROPLASTY Right 07/07/2014   Procedure: RIGHT TOTAL KNEE ARTHROPLASTY;  Surgeon: [name], MD;  Location: [facility];  Service: Orthopedics;  Laterality: Right;  . TOTAL KNEE ARTHROPLASTY Left 07/06/2015   Procedure: LEFT TOTAL KNEE ARTHROPLASTY;  Surgeon: Gaynelle Arabian, MD;  Location: [facility];  Service: Orthopedics;  Laterality: Left;    There were no vitals filed for this visit.      Subjective Assessment - 08/26/16 0935    Subjective "I forgot to do my exercises this week.  I only did the stationary bike."    Pertinent History anxiety, OA, bipolar disorder,  hx breast cancer, depression, bil TKA (R 2016, L 2017), DM, HTN, HLD   Limitations Walking   How long can you walk comfortably? 30 min   Diagnostic tests MRI c spine: negative; NCV: normal   Patient Stated Goals improve balance and stability   Currently in Pain? No/denies                         [facility] Adult PT Treatment/Exercise - 08/26/16 0001      Ambulation/Gait   Ambulation/Gait Yes   Ambulation/Gait Assistance 5: Supervision;6: Modified independent (Device/Increase time)   Ambulation/Gait Assistance Details Continue to assess gait over varying outdoor surfaces x 1000'.  She did much better today and was able to ambulate at S to mod I level with good improvement in stride length and improved gait speed.   Educated on proper shoe wear when outside as she states sometimes she is wearing sandals outdoors.     Ambulation Distance (Feet) 1000 Feet   Assistive device None   Gait Pattern Step-through pattern;Decreased stride length;Decreased step length - right;Decreased arm swing - right;Decreased arm swing - left;Decreased step length - left;Decreased dorsiflexion - right;Decreased dorsiflexion - left;Right flexed knee in stance   Ambulation Surface Level;Unlevel;Indoor;Outdoor;Paved  Stairs Yes   Stairs Assistance 6: Modified independent (Device/Increase time)   Stair Management Technique One rail Right;Alternating pattern;Forwards   Number of Stairs 4  x 2 reps   Height of Stairs 6     Neuro Re-ed    Neuro Re-ed Details  high level balance in // bars on BOSU ball black top up maintaining balance x 3 reps of 15 secs, progressing to squats x 10 reps with single UE support>no UE support as able.  Performed corner balance tasks and removed all other exercises from HEP to allow for increased compliance, see pt instruction.       Exercises   Other Exercises  Ended session with seated nustep x 4 mins at level 4 resistance w/ B UE/LEs for generalized stregnthening and  endurance.  tolerated very well.                 PT Education - 08/26/16 0936    Education provided Yes   Education Details Discussed D/C in the next 2-3 visits   Person(s) Educated Patient   Methods Explanation   Comprehension Verbalized understanding          PT Short Term Goals - 08/08/16 1024      PT SHORT TERM GOAL #1   Title verbalize understanding of fall prevention strategies to decrease fall risk (08/10/16)   Baseline 08/08/16: met today as pt able to recall fall prevention education provided at previous sessions.   Time --   Period --   Status Achieved     PT SHORT TERM GOAL #2   Title improve 5X STS to < 17 sec for improved functional strength (08/10/16)   Baseline 08/08/16: 12.93 sec's no UE support   Time --   Period --   Status Achieved     PT SHORT TERM GOAL #3   Title improve dynamic gait index to >/= 18/24 for improved mobiltiy (08/10/16)   Baseline 08/08/16: 19/24 today   Time --   Period --   Status Achieved           PT Long Term Goals - 08/08/16 1030      PT LONG TERM GOAL #1   Title independent with HEP (09/07/16)   Time 8   Period Weeks   Status New     PT LONG TERM GOAL #2   Title improve timed up and go to < 12 sec for improved mobility and decreased fall risk (09/07/16)   Baseline 08/08/16: 8.54 sec's no AD   Time --   Period --   Status Achieved     PT LONG TERM GOAL #3   Title improve 5x STS to < 15 sec for improved functional strength (09/07/16)   Baseline 08/08/16: 12.93 sec's with no UE support.    Status Achieved     PT LONG TERM GOAL #4   Title improve dynamic gait index to >/= 21/24 for decreased fall risk (09/07/16)   Time 8   Period Weeks   Status New     PT LONG TERM GOAL #5   Title amb > 500' on various indoor/outdoor surfaces independently for improved community access (09/07/16)   Time 8   Period Weeks   Status New               Plan - 08/26/16 1227    Clinical Impression Statement Skilled session continues to  focus on high level balance as well as gait over varying outdoor surfaces.  Also modified  HEP to two exercises as to promote increased compliance.    Rehab Potential Good   Clinical Impairments Affecting Rehab Potential multiple comorbidities, ?cognitive deficits   PT Frequency 2x / week   PT Duration 8 weeks   PT Treatment/Interventions ADLs/Self Care Home Management;Cryotherapy;Moist Heat;DME Instruction;Gait training;Stair training;Functional mobility training;Neuromuscular re-education;Balance training;Therapeutic exercise;Therapeutic activities;Patient/family education;Orthotic Fit/Training;Vestibular   PT Next Visit Plan outdoor gait over grass, high level balance, SLS, gait with dual task. Plan to D/C in the next 2-3 visits   Consulted and Agree with Plan of Care Patient      Patient will benefit from skilled therapeutic intervention in order to improve the following deficits and impairments:  Decreased strength, Decreased activity tolerance, Decreased cognition, Abnormal gait, Decreased balance, Decreased mobility  Visit Diagnosis: Other abnormalities of gait and mobility  Unsteadiness on feet     Problem List Patient Active Problem List   Diagnosis Date Noted  . Chronic low back pain 07/01/2016  . Abnormality of gait 05/12/2016  . Paresthesia 05/12/2016  . OA (osteoarthritis) of knee 07/07/2014    Cameron Sprang, PT, MPT [facility] [address] [place], [place], [address] Phone: [phone]   Fax:  [phone] 08/26/16, 12:30 PM  Name: [name] MRN: [id number] Date of Birth: 10-Feb-1946

## 2016-08-29 ENCOUNTER — Ambulatory Visit: Payer: Medicare Other | Admitting: Rehabilitation

## 2016-08-29 ENCOUNTER — Encounter: Payer: Self-pay | Admitting: Rehabilitation

## 2016-08-29 DIAGNOSIS — R2689 Other abnormalities of gait and mobility: Secondary | ICD-10-CM | POA: Diagnosis not present

## 2016-08-29 DIAGNOSIS — R2681 Unsteadiness on feet: Secondary | ICD-10-CM

## 2016-08-29 NOTE — Therapy (Signed)
[facility] [address] [place], [place], [address] Phone: [phone]   Fax:  [phone]  Physical Therapy Treatment  Patient Details  Name: [name] MRN: [id number] Date of Birth: [birth year] Referring Provider: Marcial Pacas, MD  Encounter Date: 08/29/2016      PT End of Session - 08/29/16 0941    Visit Number 13   Number of Visits 16   Date for PT Re-Evaluation 09/07/16   Authorization Type Medicare   PT Start Time 0931   PT Stop Time 1016   PT Time Calculation (min) 45 min   Activity Tolerance Patient tolerated treatment well   Behavior During Therapy [facility] for tasks assessed/performed      Past Medical History:  Diagnosis Date  . Anxiety   . Arthritis   . Asthma    as a child  . Bipolar 1 disorder ([place])   . Cancer ([place])    hx breast cancer  . Depression   . Diabetes mellitus without complication ([place])    type 2  . Foot drop   . History of breast cancer 1995   lumpectomy and radiation left breast  . History of duodenal ulcer 1989  . Hyperlipidemia   . Hypertension   . Stress incontinence     Past Surgical History:  Procedure Laterality Date  . BREAST LUMPECTOMY  1995   left breast / radiation  . DILATION AND CURETTAGE OF UTERUS  1989  . JOINT REPLACEMENT    . KNEE ARTHROSCOPY     rt knee  . TONSILLECTOMY    . TOTAL KNEE ARTHROPLASTY Right 07/07/2014   Procedure: RIGHT TOTAL KNEE ARTHROPLASTY;  Surgeon: [name], MD;  [facility];  Service: Orthopedics;  Laterality: Right;  . TOTAL KNEE ARTHROPLASTY Left 07/06/2015   Procedure: LEFT TOTAL KNEE ARTHROPLASTY;  Surgeon: Gaynelle Arabian, MD;  [facility];  Service: Orthopedics;  Laterality: Left;    There were no vitals filed for this visit.      Subjective Assessment - 08/29/16 0940    Subjective "I'm doing okay"  Reports no problems, no falls.    Pertinent History anxiety, OA, bipolar disorder, hx breast cancer,  depression, bil TKA (R 2016, L 2017), DM, HTN, HLD   Limitations Walking   How long can you walk comfortably? 30 min   Diagnostic tests MRI c spine: negative; NCV: normal   Patient Stated Goals improve balance and stability   Currently in Pain? No/denies                         [facility] Adult PT Treatment/Exercise - 08/29/16 0942      Standardized Balance Assessment   Standardized Balance Assessment Dynamic Gait Index     Dynamic Gait Index   Level Surface Normal   Change in Gait Speed Normal   Gait with Horizontal Head Turns Mild Impairment   Gait with Vertical Head Turns Mild Impairment   Gait and Pivot Turn Normal   Step Over Obstacle Normal   Step Around Obstacles Mild Impairment   Steps Mild Impairment   Total Score 20     Self-Care   Self-Care Other Self-Care Comments   Other Self-Care Comments  Discussed wrapping up therapy in next appointment due to progress.  Pt continues to have balance deficits, however feel that she has plateaued with progress mostly due to lack of compliance with balance HEP at home.   DGI score of  20/24 indicates that she is over fall risk threshold, however is same score as when previously tested.   She has made marked progress with gait over outdoor surfaces and will plan to wrap up on Friday.  Pt verbalized understanding.       Neuro Re-ed    Neuro Re-ed Details  Performed high level balance during session to incorporate vestibular challenges; walking with head turns side to side while having to locate and name card/suite held up by therapist., standing on foam airdex pad while tossing ball to rebounder with feet apart x 10 reps and feet together x 10 reps-cues for using arms to toss ball as she tended to "lunge" full body forward.   Performed marching on foam airdex pad x 20 reps with cues for slower speed and increased activation on LLE as she was noted to have decreased balance in L stance.                   PT Education -  08/29/16 0940    Education provided Yes   Education Details Will D/C on Friday   Person(s) Educated Patient   Methods Explanation   Comprehension Verbalized understanding          PT Short Term Goals - 08/08/16 1024      PT SHORT TERM GOAL #1   Title verbalize understanding of fall prevention strategies to decrease fall risk (08/10/16)   Baseline 08/08/16: met today as pt able to recall fall prevention education provided at previous sessions.   Time --   Period --   Status Achieved     PT SHORT TERM GOAL #2   Title improve 5X STS to < 17 sec for improved functional strength (08/10/16)   Baseline 08/08/16: 12.93 sec's no UE support   Time --   Period --   Status Achieved     PT SHORT TERM GOAL #3   Title improve dynamic gait index to >/= 18/24 for improved mobiltiy (08/10/16)   Baseline 08/08/16: 19/24 today   Time --   Period --   Status Achieved           PT Long Term Goals - 08/29/16 0941      PT LONG TERM GOAL #1   Title independent with HEP (09/07/16)   Time 8   Period Weeks   Status New     PT LONG TERM GOAL #2   Title improve timed up and go to < 12 sec for improved mobility and decreased fall risk (09/07/16)   Baseline 08/08/16: 8.54 sec's no AD   Status Achieved     PT LONG TERM GOAL #3   Title improve 5x STS to < 15 sec for improved functional strength (09/07/16)   Baseline 08/08/16: 12.93 sec's with no UE support.    Status Achieved     PT LONG TERM GOAL #4   Title improve dynamic gait index to >/= 21/24 for decreased fall risk (09/07/16)   Baseline 20/24 on 08/29/16  same score from previous assessment   Time 8   Period Weeks   Status Not Met     PT LONG TERM GOAL #5   Title amb > 500' on various indoor/outdoor surfaces independently for improved community access (09/07/16)   Time 8   Period Weeks   Status New               Plan - 08/29/16 1029    Clinical Impression Statement Skilled session focused on  assessment of DGI for LTG.  Note that score is  still 20/24 from previous assessment but that she has made improvements with balance overall, esp gait over outdoor surfaces.  Feel that she is at a point to D/C and encouraged her to continue HEP at home.   Will plan to D/C on next visit folllowing checking remaining goals.    Rehab Potential Good   Clinical Impairments Affecting Rehab Potential multiple comorbidities, ?cognitive deficits   PT Frequency 2x / week   PT Duration 8 weeks   PT Treatment/Interventions ADLs/Self Care Home Management;Cryotherapy;Moist Heat;DME Instruction;Gait training;Stair training;Functional mobility training;Neuromuscular re-education;Balance training;Therapeutic exercise;Therapeutic activities;Patient/family education;Orthotic Fit/Training;Vestibular   PT Next Visit Plan LTGs and D/C   Consulted and Agree with Plan of Care Patient      Patient will benefit from skilled therapeutic intervention in order to improve the following deficits and impairments:  Decreased strength, Decreased activity tolerance, Decreased cognition, Abnormal gait, Decreased balance, Decreased mobility  Visit Diagnosis: Other abnormalities of gait and mobility  Unsteadiness on feet     Problem List Patient Active Problem List   Diagnosis Date Noted  . Chronic low back pain 07/01/2016  . Abnormality of gait 05/12/2016  . Paresthesia 05/12/2016  . OA (osteoarthritis) of knee 07/07/2014    Cameron Sprang, PT, MPT [facility] [address] [place], [place], [address] Phone: [phone]   Fax:  [phone] 08/29/16, 10:32 AM  Name: [name] MRN: [id number] Date of Birth: 07/12/1945

## 2016-09-02 ENCOUNTER — Encounter: Payer: Self-pay | Admitting: Rehabilitation

## 2016-09-02 ENCOUNTER — Ambulatory Visit: Payer: Medicare Other | Admitting: Rehabilitation

## 2016-09-02 DIAGNOSIS — R2689 Other abnormalities of gait and mobility: Secondary | ICD-10-CM | POA: Diagnosis not present

## 2016-09-02 DIAGNOSIS — R2681 Unsteadiness on feet: Secondary | ICD-10-CM | POA: Diagnosis not present

## 2016-09-02 NOTE — Therapy (Signed)
[facility] [address] [place], [place], [address] Phone: [phone]   Fax:  [phone]  Physical Therapy Treatment and DC Summary  Patient Details  Name: SABRIAH HOBBINS MRN: [id number] Date of Birth: [birth year] Referring Provider: Marcial Pacas, MD  Encounter Date: 09/02/2016      PT End of Session - 09/02/16 1412    Visit Number 14   Number of Visits 16   Date for PT Re-Evaluation 09/07/16   Authorization Type Medicare   PT Start Time 0932   PT Stop Time 1015   PT Time Calculation (min) 43 min   Activity Tolerance Patient tolerated treatment well   Behavior During Therapy [facility] for tasks assessed/performed      Past Medical History:  Diagnosis Date  . Anxiety   . Arthritis   . Asthma    as a child  . Bipolar 1 disorder ([place])   . Cancer ([place])    hx breast cancer  . Depression   . Diabetes mellitus without complication ([place])    type 2  . Foot drop   . History of breast cancer 1995   lumpectomy and radiation left breast  . History of duodenal ulcer 1989  . Hyperlipidemia   . Hypertension   . Stress incontinence     Past Surgical History:  Procedure Laterality Date  . BREAST LUMPECTOMY  1995   left breast / radiation  . DILATION AND CURETTAGE OF UTERUS  1989  . JOINT REPLACEMENT    . KNEE ARTHROSCOPY     rt knee  . TONSILLECTOMY    . TOTAL KNEE ARTHROPLASTY Right 07/07/2014   Procedure: RIGHT TOTAL KNEE ARTHROPLASTY;  Surgeon: [name], MD;  [facility];  Service: Orthopedics;  Laterality: Right;  . TOTAL KNEE ARTHROPLASTY Left 07/06/2015   Procedure: LEFT TOTAL KNEE ARTHROPLASTY;  Surgeon: Gaynelle Arabian, MD;  [facility];  Service: Orthopedics;  Laterality: Left;    There were no vitals filed for this visit.      Subjective Assessment - 09/02/16 0937    Subjective Reports that she did her exercises all week and sometimes twice a day.     Pertinent History anxiety, OA,  bipolar disorder, hx breast cancer, depression, bil TKA (R 2016, L 2017), DM, HTN, HLD   Limitations Walking   How long can you walk comfortably? 30 min   Diagnostic tests MRI c spine: negative; NCV: normal   Patient Stated Goals improve balance and stability   Currently in Pain? No/denies                         [facility] Adult PT Treatment/Exercise - 09/02/16 1002      Ambulation/Gait   Ambulation/Gait Yes   Ambulation/Gait Assistance 6: Modified independent (Device/Increase time)   Ambulation/Gait Assistance Details Assessed outdoor gait for LTG.  Pt able to ambulate over varying surfaces outdoors x 700' at mod I level.  Slower gait speed but no evidence of LOB.     Ambulation Distance (Feet) 700 Feet   Assistive device None   Gait Pattern Step-through pattern;Decreased stride length;Decreased step length - right;Decreased arm swing - right;Decreased arm swing - left;Decreased step length - left;Decreased dorsiflexion - right;Decreased dorsiflexion - left;Right flexed knee in stance   Ambulation Surface Level;Unlevel;Indoor;Outdoor;Grass;Paved     Self-Care   Self-Care Other Self-Care Comments   Other Self-Care Comments  Discussed continued community fitness as well as corrections to HEP  Neuro Re-ed    Neuro Re-ed Details  Performed current HEP, see last visit for details on corner balance tasks.  Requires some cues to complete correctly, but is more consistent in doing them.  Also performed counter top exercises with walking in tandem forwards and backwards x 2 reps, marching slowly forwards and backwards x 2 reps.  Note some difficulty with recalling task, but improved with cues.  Ended session with balance in // bars standing on half foam roller maintaining balance x 3 reps of 15 secs progressing from B UE>single UE>no UE support.  Note that she needs marked assist to use hip strategy.  Then had her stand tandem on half foam roller in same progress x 3 reps of 15 secs.   Did much better in this position maintaining balance.                  PT Education - 09/02/16 [phone]    Education provided Yes   Education Details continued compliance with HEP, continued community fitness.    Person(s) Educated Patient   Methods Explanation   Comprehension Verbalized understanding          PT Short Term Goals - 08/08/16 1024      PT SHORT TERM GOAL #1   Title verbalize understanding of fall prevention strategies to decrease fall risk (08/10/16)   Baseline 08/08/16: met today as pt able to recall fall prevention education provided at previous sessions.   Time --   Period --   Status Achieved     PT SHORT TERM GOAL #2   Title improve 5X STS to < 17 sec for improved functional strength (08/10/16)   Baseline 08/08/16: 12.93 sec's no UE support   Time --   Period --   Status Achieved     PT SHORT TERM GOAL #3   Title improve dynamic gait index to >/= 18/24 for improved mobiltiy (08/10/16)   Baseline 08/08/16: 19/24 today   Time --   Period --   Status Achieved           PT Long Term Goals - 09/02/16 1638      PT LONG TERM GOAL #1   Title independent with HEP (09/07/16)   Baseline met 09/02/16, still needs some cues but is more consistent in doing them.    Time 8   Period Weeks   Status Achieved     PT LONG TERM GOAL #2   Title improve timed up and go to < 12 sec for improved mobility and decreased fall risk (09/07/16)   Baseline 08/08/16: 8.54 sec's no AD   Status Achieved     PT LONG TERM GOAL #3   Title improve 5x STS to < 15 sec for improved functional strength (09/07/16)   Baseline 08/08/16: 12.93 sec's with no UE support.    Status Achieved     PT LONG TERM GOAL #4   Title improve dynamic gait index to >/= 21/24 for decreased fall risk (09/07/16)   Baseline 20/24 on 08/29/16  same score from previous assessment   Time 8   Period Weeks   Status Not Met     PT LONG TERM GOAL #5   Title amb > 500' on various indoor/outdoor surfaces independently for  improved community access (09/07/16)   Baseline met 09/02/16   Time 8   Period Weeks   Status Achieved               Plan - 09/02/16  1413    Clinical Impression Statement Session focused on checking remaining goals.  She has met 4/5 LTGs and made progress towards remaining goal.  Feel that she is ready for D/C at this point.  Pt verbalized understand.     Rehab Potential Good   Clinical Impairments Affecting Rehab Potential multiple comorbidities, ?cognitive deficits   PT Frequency 2x / week   PT Duration 8 weeks   PT Treatment/Interventions ADLs/Self Care Home Management;Cryotherapy;Moist Heat;DME Instruction;Gait training;Stair training;Functional mobility training;Neuromuscular re-education;Balance training;Therapeutic exercise;Therapeutic activities;Patient/family education;Orthotic Fit/Training;Vestibular   PT Next Visit Plan LTGs and D/C   Consulted and Agree with Plan of Care Patient      Patient will benefit from skilled therapeutic intervention in order to improve the following deficits and impairments:  Decreased strength, Decreased activity tolerance, Decreased cognition, Abnormal gait, Decreased balance, Decreased mobility  Visit Diagnosis: Other abnormalities of gait and mobility  Unsteadiness on feet       G-Codes - 2016/09/13 1417    Functional Assessment Tool Used (Outpatient Only) DGI: 20/24   Functional Limitation Mobility: Walking and moving around   Mobility: Walking and Moving Around Current Status [phone]) At least 1 percent but less than 20 percent impaired, limited or restricted   Mobility: Walking and Moving Around Goal Status [phone]) At least 1 percent but less than 20 percent impaired, limited or restricted   Mobility: Walking and Moving Around Discharge Status [phone]) At least 1 percent but less than 20 percent impaired, limited or restricted     PHYSICAL THERAPY DISCHARGE SUMMARY  Visits from Start of Care: 14  Current functional level related  to goals / functional outcomes: See above   Remaining deficits: Pt continues to have high level balance deficits related to vestibular challenges.  Has HEP to address these.    Education / Equipment: HEP  Plan: Patient agrees to discharge.  Patient goals were met. Patient is being discharged due to meeting the stated rehab goals.  ?????         Problem List Patient Active Problem List   Diagnosis Date Noted  . Chronic low back pain 07/01/2016  . Abnormality of gait 05/12/2016  . Paresthesia 05/12/2016  . OA (osteoarthritis) of knee 07/07/2014   Cameron Sprang, PT, MPT [facility] [address] [place], [place], [address] Phone: [phone]   Fax:  [phone] 09-13-16, 2:19 PM  Name: ROMELIA BROMELL MRN: [id number] Date of Birth: 07-22-1945

## 2016-09-05 ENCOUNTER — Ambulatory Visit: Payer: Medicare Other | Admitting: Physical Therapy

## 2016-09-08 ENCOUNTER — Ambulatory Visit: Payer: Medicare Other | Admitting: Physical Therapy

## 2016-11-11 DIAGNOSIS — E785 Hyperlipidemia, unspecified: Secondary | ICD-10-CM | POA: Diagnosis not present

## 2016-11-11 DIAGNOSIS — E113599 Type 2 diabetes mellitus with proliferative diabetic retinopathy without macular edema, unspecified eye: Secondary | ICD-10-CM | POA: Diagnosis not present

## 2016-11-11 DIAGNOSIS — R42 Dizziness and giddiness: Secondary | ICD-10-CM | POA: Diagnosis not present

## 2016-11-22 ENCOUNTER — Other Ambulatory Visit: Payer: Self-pay | Admitting: Cardiology

## 2016-11-22 DIAGNOSIS — R42 Dizziness and giddiness: Secondary | ICD-10-CM

## 2016-11-23 ENCOUNTER — Ambulatory Visit (HOSPITAL_COMMUNITY)
Admission: RE | Admit: 2016-11-23 | Discharge: 2016-11-23 | Disposition: A | Discharge status: Home / Self Care | Payer: Medicare Other | Source: Ambulatory Visit | Attending: Vascular Surgery | Admitting: Vascular Surgery

## 2016-11-23 DIAGNOSIS — R42 Dizziness and giddiness: Secondary | ICD-10-CM | POA: Diagnosis not present

## 2016-11-23 LAB — VAS US CAROTID
LCCADSYS: 63 cm/s
LEFT ECA DIAS: -11 cm/s
Left CCA dist dias: 19 cm/s
Left CCA prox dias: 24 cm/s
Left CCA prox sys: 95 cm/s
Left ICA dist dias: -24 cm/s
Left ICA dist sys: -68 cm/s
Left ICA prox dias: -29 cm/s
Left ICA prox sys: -101 cm/s
RCCAPDIAS: 24 cm/s
RIGHT CCA MID DIAS: 26 cm/s
RIGHT ECA DIAS: -16 cm/s
Right CCA prox sys: 95 cm/s
Right cca dist sys: -65 cm/s

## 2016-12-28 ENCOUNTER — Encounter: Payer: Self-pay | Admitting: Neurology

## 2016-12-28 ENCOUNTER — Ambulatory Visit (INDEPENDENT_AMBULATORY_CARE_PROVIDER_SITE_OTHER): Payer: Medicare Other | Admitting: Neurology

## 2016-12-28 VITALS — BP 117/71 | HR 75 | Ht 64.75 in | Wt 174.0 lb

## 2016-12-28 DIAGNOSIS — R269 Unspecified abnormalities of gait and mobility: Secondary | ICD-10-CM

## 2016-12-28 DIAGNOSIS — G8929 Other chronic pain: Secondary | ICD-10-CM | POA: Diagnosis not present

## 2016-12-28 DIAGNOSIS — M545 Low back pain: Secondary | ICD-10-CM

## 2016-12-28 NOTE — Progress Notes (Signed)
PATIENT: [name] DOB: 21-Aug-1945  Chief Complaint  Patient presents with  . Gait Problem    She is here with her husband, [name].  Says she still has a slow, limping gait but it is unchanged since last seen.  No falls reported.     HISTORICAL  [name] is a 71 year old right-handed female, accompanied by her husband seen in refer by her primary care physician Dr. Darcus Austin for evaluation of right foot drop, initial evaluation was on May 12 2016.  I reviewed and summarized the referring note, she had a history of type 2 diabetes, hyperlipidemia, osteopenia, history of left breast cancer in 1995, status post lobectomy, followed by radiation therapy, she right knee replacement by Dr.  Maureen Ralphs on July 07 2014, left knee replacement in March 2017, She was diagnosed with paranoid schizophrenia since 1977,  she has been stable on stable dose of Abilify 30mg  daily since 2002, she was treated with Risperidone in the past.  She developed right knee pain in 2006, was evaluated by orthopedic surgeon, was noted to have gait abnormality, was given the diagnosis of right foot drop, but she denies sensory loss of her right foot, denies significant low back pain, over the years, she noticed that she has to purposelly raise her right leg to walk better.  Since summer of 2017, while walking for extended period of time, she noted more difficulty, she complains few years history of urinary incontinence, wearing pads, she denies significant low back pain, neck pain, she does have intermittent bilateral feet paresthesia, denies bilateral upper extremity weakness or numbness,  Because of long-term neuroleptic medication treatment, she developed mild parkinsonian features, right hand tremor, was given the prescription of artane without significant improvement of her symptoms.  Laboratory evaluations, glucose was 118,    Update July 01 2016: She came in for electrodiagnostic study today,  which showed no significant abnormality, in specific, there is no evidence of large fiber peripheral neuropathy or bilateral lumbosacral radiculopathy.  Today she complains of low back pain, unsteady gait, intermittent right hand tremor,  We have personally reviewed MRI of the cervical in February 2018, multilevel degenerative disc disease no significant canal or foraminal stenosis.  UPDATE December 28 2016: She is accompanied by her husband at today's clinical visit, she no longer has bilateral knee pain, but complains of gait abnormality, balance issues, she was doing better when she had physical therapy and exercise regularly, now she is much more sedentary,   REVIEW OF SYSTEMS: Full 14 system review of systems performed and notable only for as above  ALLERGIES: Allergies  Allergen Reactions  . Ciprofloxacin Hcl Rash  . Latex Rash  . Penicillins Rash    Has patient had a PCN reaction causing immediate rash, facial/tongue/throat swelling, SOB or lightheadedness with hypotension: Yes Has patient had a PCN reaction causing severe rash involving mucus membranes or skin necrosis: No Has patient had a PCN reaction that required hospitalization No Has patient had a PCN reaction occurring within the last 10 years: Yes If all of the above answers are "NO", then may proceed with Cephalosporin use.  . Sulfa Antibiotics Rash    HOME MEDICATIONS: Current Outpatient Prescriptions  Medication Sig Dispense Refill  . ARIPiprazole (ABILIFY) 30 MG tablet Take 30 mg by mouth at bedtime.    Marland Kitchen aspirin 81 MG tablet Chew by mouth daily.    . clonazePAM (KLONOPIN) 0.5 MG tablet Taking two tablets at bedtime and one additional tablet  daily, if needed.    . DiphenhydrAMINE HCl (BENADRYL PO) Take by mouth. Takes as needed for allergies.    Marland Kitchen insulin lispro protamine-lispro (HUMALOG 75/25 MIX) (75-25) 100 UNIT/ML SUSP injection Inject 12-14 Units into the skin 2 (two) times daily. Sliding scale depending on  time of day and what eating.    . metFORMIN (GLUCOPHAGE-XR) 500 MG 24 hr tablet Take 1,000 mg by mouth 2 (two) times daily.  5  . methocarbamol (ROBAXIN) 500 MG tablet Take 1 tablet (500 mg total) by mouth every 6 (six) hours as needed for muscle spasms. 80 tablet 0  . pravastatin (PRAVACHOL) 20 MG tablet Take 20 mg by mouth daily after breakfast.    . ramipril (ALTACE) 5 MG capsule Take 5 mg by mouth at bedtime.    . sitaGLIPtin (JANUVIA) 100 MG tablet Take 100 mg by mouth daily with breakfast.    . traMADol (ULTRAM) 50 MG tablet Take 1-2 tablets (50-100 mg total) by mouth every 6 (six) hours as needed (mild pain). 80 tablet 1   No current facility-administered medications for this visit.     PAST MEDICAL HISTORY: Past Medical History:  Diagnosis Date  . Anxiety   . Arthritis   . Asthma    as a child  . Bipolar 1 disorder ([place])   . Cancer ([place])    hx breast cancer  . Depression   . Diabetes mellitus without complication ([place])    type 2  . Foot drop   . History of breast cancer 1995   lumpectomy and radiation left breast  . History of duodenal ulcer 1989  . Hyperlipidemia   . Hypertension   . Stress incontinence     PAST SURGICAL HISTORY: Past Surgical History:  Procedure Laterality Date  . BREAST LUMPECTOMY  1995   left breast / radiation  . DILATION AND CURETTAGE OF UTERUS  1989  . JOINT REPLACEMENT    . KNEE ARTHROSCOPY     rt knee  . TONSILLECTOMY    . TOTAL KNEE ARTHROPLASTY Right 07/07/2014   Procedure: RIGHT TOTAL KNEE ARTHROPLASTY;  Surgeon: [name], MD;  Location: [facility];  Service: Orthopedics;  Laterality: Right;  . TOTAL KNEE ARTHROPLASTY Left 07/06/2015   Procedure: LEFT TOTAL KNEE ARTHROPLASTY;  Surgeon: Gaynelle Arabian, MD;  Location: [facility];  Service: Orthopedics;  Laterality: Left;    FAMILY HISTORY: Family History  Problem Relation Age of Onset  . Schizophrenia Mother   . Prostate cancer Father   . Aneurysm Father        stomach  . Diabetes  Father   . Colitis Paternal Grandmother     SOCIAL HISTORY:  Social History   Social History  . Marital status: Married    Spouse name: N/A  . Number of children: 1  . Years of education: Associates   Occupational History  . Retired    Social History Main Topics  . Smoking status: Never Smoker  . Smokeless tobacco: Never Used  . Alcohol use Yes     Comment: occasional  . Drug use: No  . Sexual activity: Not on file   Other Topics Concern  . Not on file   Social History Narrative   Lives at home with husband.   Right-handed.   3-4 cups caffeine.     PHYSICAL EXAM   Vitals:   12/28/16 0943  BP: 117/71  Pulse: 75  Weight: 174 lb (78.9 kg)  Height: 5' 4.75" (1.645 m)  Not recorded      Body mass index is 29.18 kg/m.  PHYSICAL EXAMNIATION:  Gen: NAD, conversant, well nourised, obese, well groomed                     Cardiovascular: Regular rate rhythm, no peripheral edema, warm, nontender. Eyes: Conjunctivae clear without exudates or hemorrhage Neck: Supple, no carotid bruits. Pulmonary: Clear to auscultation bilaterally   NEUROLOGICAL EXAM:  MENTAL STATUS: Speech:    Speech is normal; fluent and spontaneous with normal comprehension.  Cognition:     Orientation to time, place and person     Normal recent and remote memory     Normal Attention span and concentration     Normal Language, naming, repeating,spontaneous speech     Fund of knowledge   CRANIAL NERVES: CN II: Visual fields are full to confrontation. Fundoscopic exam is normal with sharp discs and no vascular changes. Pupils are round equal and briskly reactive to light. CN III, IV, VI: extraocular movement are normal. No ptosis. CN V: Facial sensation is intact to pinprick in all 3 divisions bilaterally. Corneal responses are intact.  CN VII: Face is symmetric with normal eye closure and smile. CN VIII: Hearing is normal to rubbing fingers CN IX, X: Palate elevates symmetrically.  Phonation is normal. CN XI: Head turning and shoulder shrug are intact CN XII: Tongue is midline with normal movements and no atrophy.  MOTOR: She has mild bilateral upper extremity rigidity, slight right ankle dorsiflexion weakness,  REFLEXES: Reflexes are 3 and symmetric at the biceps, triceps, knees, and ankles. Plantar responses are flexor.  SENSORY: Intact to light touch, pinprick, positional sensation and vibratory sensation are intact in fingers and toes.  COORDINATION: Rapid alternating movements and fine finger movements are intact. There is no dysmetria on finger-to-nose and heel-knee-shin.    GAIT/STANCE: Deliberate effort, wide-based, cautious,  DIAGNOSTIC DATA (LABS, IMAGING, TESTING) - I reviewed patient records, labs, notes, testing and imaging myself where available.   ASSESSMENT AND PLAN  [place] EZRA MARQUESS is a 71 y.o. female   Gait abnormality  Multifactorial, bilateral knee replacement, joints pain, deconditioning,  I have encouraged her continue moderate exercise, water aerobic    Marcial Pacas, M.D. Ph.D.  [facility] [address] [place], [place] [address] Ph: [phone] Fax: [phone]  CC: Darcus Austin, MD

## 2017-01-17 DIAGNOSIS — E1165 Type 2 diabetes mellitus with hyperglycemia: Secondary | ICD-10-CM | POA: Diagnosis not present

## 2017-01-17 DIAGNOSIS — R809 Proteinuria, unspecified: Secondary | ICD-10-CM | POA: Diagnosis not present

## 2017-01-17 DIAGNOSIS — E7800 Pure hypercholesterolemia, unspecified: Secondary | ICD-10-CM | POA: Diagnosis not present

## 2017-01-24 DIAGNOSIS — I10 Essential (primary) hypertension: Secondary | ICD-10-CM | POA: Diagnosis not present

## 2017-01-24 DIAGNOSIS — E1165 Type 2 diabetes mellitus with hyperglycemia: Secondary | ICD-10-CM | POA: Diagnosis not present

## 2017-01-24 DIAGNOSIS — E7800 Pure hypercholesterolemia, unspecified: Secondary | ICD-10-CM | POA: Diagnosis not present

## 2017-01-24 DIAGNOSIS — R809 Proteinuria, unspecified: Secondary | ICD-10-CM | POA: Diagnosis not present

## 2017-02-03 DIAGNOSIS — Z23 Encounter for immunization: Secondary | ICD-10-CM | POA: Diagnosis not present

## 2017-04-05 DIAGNOSIS — L821 Other seborrheic keratosis: Secondary | ICD-10-CM | POA: Diagnosis not present

## 2017-04-05 DIAGNOSIS — D1801 Hemangioma of skin and subcutaneous tissue: Secondary | ICD-10-CM | POA: Diagnosis not present

## 2017-04-05 DIAGNOSIS — L814 Other melanin hyperpigmentation: Secondary | ICD-10-CM | POA: Diagnosis not present

## 2017-04-05 DIAGNOSIS — D229 Melanocytic nevi, unspecified: Secondary | ICD-10-CM | POA: Diagnosis not present

## 2017-04-20 DIAGNOSIS — R413 Other amnesia: Secondary | ICD-10-CM | POA: Diagnosis not present

## 2017-04-20 DIAGNOSIS — F339 Major depressive disorder, recurrent, unspecified: Secondary | ICD-10-CM | POA: Diagnosis not present

## 2017-04-20 DIAGNOSIS — Z23 Encounter for immunization: Secondary | ICD-10-CM | POA: Diagnosis not present

## 2017-04-20 DIAGNOSIS — E782 Mixed hyperlipidemia: Secondary | ICD-10-CM | POA: Diagnosis not present

## 2017-04-20 DIAGNOSIS — Z0000 Encounter for general adult medical examination without abnormal findings: Secondary | ICD-10-CM | POA: Diagnosis not present

## 2017-04-20 DIAGNOSIS — E1165 Type 2 diabetes mellitus with hyperglycemia: Secondary | ICD-10-CM | POA: Diagnosis not present

## 2017-04-20 DIAGNOSIS — I10 Essential (primary) hypertension: Secondary | ICD-10-CM | POA: Diagnosis not present

## 2017-04-25 DIAGNOSIS — R413 Other amnesia: Secondary | ICD-10-CM | POA: Diagnosis not present

## 2017-04-26 DIAGNOSIS — R413 Other amnesia: Secondary | ICD-10-CM | POA: Diagnosis not present

## 2017-04-27 DIAGNOSIS — R413 Other amnesia: Secondary | ICD-10-CM | POA: Diagnosis not present

## 2017-04-28 DIAGNOSIS — R413 Other amnesia: Secondary | ICD-10-CM | POA: Diagnosis not present

## 2017-05-01 DIAGNOSIS — R413 Other amnesia: Secondary | ICD-10-CM | POA: Diagnosis not present

## 2017-05-08 DIAGNOSIS — R413 Other amnesia: Secondary | ICD-10-CM | POA: Diagnosis not present

## 2017-05-15 DIAGNOSIS — H2513 Age-related nuclear cataract, bilateral: Secondary | ICD-10-CM | POA: Diagnosis not present

## 2017-05-15 DIAGNOSIS — H5203 Hypermetropia, bilateral: Secondary | ICD-10-CM | POA: Diagnosis not present

## 2017-05-15 DIAGNOSIS — E119 Type 2 diabetes mellitus without complications: Secondary | ICD-10-CM | POA: Diagnosis not present

## 2017-05-15 DIAGNOSIS — R413 Other amnesia: Secondary | ICD-10-CM | POA: Diagnosis not present

## 2017-05-22 DIAGNOSIS — R413 Other amnesia: Secondary | ICD-10-CM | POA: Diagnosis not present

## 2017-05-29 DIAGNOSIS — R413 Other amnesia: Secondary | ICD-10-CM | POA: Diagnosis not present

## 2017-06-21 DIAGNOSIS — R413 Other amnesia: Secondary | ICD-10-CM | POA: Diagnosis not present

## 2017-06-21 DIAGNOSIS — E538 Deficiency of other specified B group vitamins: Secondary | ICD-10-CM | POA: Diagnosis not present

## 2017-07-20 DIAGNOSIS — E1165 Type 2 diabetes mellitus with hyperglycemia: Secondary | ICD-10-CM | POA: Diagnosis not present

## 2017-07-20 DIAGNOSIS — E7800 Pure hypercholesterolemia, unspecified: Secondary | ICD-10-CM | POA: Diagnosis not present

## 2017-07-20 DIAGNOSIS — N390 Urinary tract infection, site not specified: Secondary | ICD-10-CM | POA: Diagnosis not present

## 2017-07-26 DIAGNOSIS — Z1231 Encounter for screening mammogram for malignant neoplasm of breast: Secondary | ICD-10-CM | POA: Diagnosis not present

## 2017-07-26 DIAGNOSIS — Z124 Encounter for screening for malignant neoplasm of cervix: Secondary | ICD-10-CM | POA: Diagnosis not present

## 2017-07-26 DIAGNOSIS — Z6829 Body mass index (BMI) 29.0-29.9, adult: Secondary | ICD-10-CM | POA: Diagnosis not present

## 2017-07-27 DIAGNOSIS — E7800 Pure hypercholesterolemia, unspecified: Secondary | ICD-10-CM | POA: Diagnosis not present

## 2017-07-27 DIAGNOSIS — R809 Proteinuria, unspecified: Secondary | ICD-10-CM | POA: Diagnosis not present

## 2017-07-27 DIAGNOSIS — I10 Essential (primary) hypertension: Secondary | ICD-10-CM | POA: Diagnosis not present

## 2017-07-27 DIAGNOSIS — E1165 Type 2 diabetes mellitus with hyperglycemia: Secondary | ICD-10-CM | POA: Diagnosis not present

## 2017-07-27 DIAGNOSIS — E1140 Type 2 diabetes mellitus with diabetic neuropathy, unspecified: Secondary | ICD-10-CM | POA: Diagnosis not present

## 2017-07-27 DIAGNOSIS — M21371 Foot drop, right foot: Secondary | ICD-10-CM | POA: Diagnosis not present

## 2017-08-03 DIAGNOSIS — F209 Schizophrenia, unspecified: Secondary | ICD-10-CM | POA: Diagnosis not present

## 2017-08-15 ENCOUNTER — Ambulatory Visit (INDEPENDENT_AMBULATORY_CARE_PROVIDER_SITE_OTHER): Payer: Medicare Other | Admitting: Neurology

## 2017-08-15 ENCOUNTER — Telehealth: Payer: Self-pay | Admitting: Neurology

## 2017-08-15 ENCOUNTER — Encounter: Payer: Self-pay | Admitting: Neurology

## 2017-08-15 VITALS — BP 117/72 | HR 67 | Ht 64.75 in | Wt 171.0 lb

## 2017-08-15 DIAGNOSIS — R413 Other amnesia: Secondary | ICD-10-CM

## 2017-08-15 DIAGNOSIS — R269 Unspecified abnormalities of gait and mobility: Secondary | ICD-10-CM | POA: Diagnosis not present

## 2017-08-15 NOTE — Telephone Encounter (Signed)
Medicare/bcbs fed order sent to [facility] no auth they will reach out to the pt to schedule.

## 2017-08-15 NOTE — Progress Notes (Signed)
PATIENT: [name] DOB: 17-Oct-1945  Chief Complaint  Patient presents with  . Follow-up    PCP: Dr. Darcus Austin. Patient has been having balance issues, she had a fall.      HISTORICAL  [name] is a 72 year old right-handed female, accompanied by her husband seen in refer by her primary care physician Dr. Darcus Austin for evaluation of right foot drop, initial evaluation was on May 12 2016.  I reviewed and summarized the referring note, she had a history of type 2 diabetes, hyperlipidemia, osteopenia, history of left breast cancer in 1995, status post lobectomy, followed by radiation therapy, she right knee replacement by Dr.  Maureen Ralphs on July 07 2014, left knee replacement in March 2017, She was diagnosed with paranoid schizophrenia since 1977,  she has been stable on stable dose of Abilify 30mg  daily since 2002, she was treated with Risperidone in the past.  She developed right knee pain in 2006, was evaluated by orthopedic surgeon, was noted to have gait abnormality, was given the diagnosis of right foot drop, but she denies sensory loss of her right foot, denies significant low back pain, over the years, she noticed that she has to purposelly raise her right leg to walk better.  Since summer of 2017, while walking for extended period of time, she noted more difficulty, she complains few years history of urinary incontinence, wearing pads, she denies significant low back pain, neck pain, she does have intermittent bilateral feet paresthesia, denies bilateral upper extremity weakness or numbness,  Because of long-term neuroleptic medication treatment, she developed mild parkinsonian features, right hand tremor, was given the prescription of artane without significant improvement of her symptoms.  Laboratory evaluations, glucose was 118,    Update July 01 2016: She came in for electrodiagnostic study today, which showed no significant abnormality, in specific,  there is no evidence of large fiber peripheral neuropathy or bilateral lumbosacral radiculopathy.  Today she complains of low back pain, unsteady gait, intermittent right hand tremor,  We have personally reviewed MRI of the cervical in February 2018, multilevel degenerative disc disease no significant canal or foraminal stenosis.  UPDATE December 28 2016: She is accompanied by her husband at today's clinical visit, she no longer has bilateral knee pain, but complains of gait abnormality, balance issues, she was doing better when she had physical therapy and exercise regularly, now she is much more sedentary,  UPDATE August 15 2017: She is accompanied by her husband at today's clinical visit, she fell few weeks ago, she was walking with her husband at the neighborhood, then all of a sudden, she stood still, readily sliding down forward, need 2 people assistance to get up, no loss of consciousness, her husband has to drive her back home, she needed assistance to get in and out of the car, complained of fatigue slept rest of the day,  Husband also noted patient has gradual worsening memory loss, decreased functional level, she does not feel comfortable driving long distance anymore, still active at church, able to drive independently to church, grocery shopping few times each week,  But she tends to get frustrated easily, has broke car key few times,  REVIEW OF SYSTEMS: Full 14 system review of systems performed and notable only for as above  ALLERGIES: Allergies  Allergen Reactions  . Ciprofloxacin Hcl Rash  . Latex Rash  . Penicillins Rash    Has patient had a PCN reaction causing immediate rash, facial/tongue/throat swelling, SOB or lightheadedness with  hypotension: Yes Has patient had a PCN reaction causing severe rash involving mucus membranes or skin necrosis: No Has patient had a PCN reaction that required hospitalization No Has patient had a PCN reaction occurring within the last 10  years: Yes If all of the above answers are "NO", then may proceed with Cephalosporin use.  . Sulfa Antibiotics Rash    HOME MEDICATIONS: Current Outpatient Medications  Medication Sig Dispense Refill  . ARIPiprazole (ABILIFY) 30 MG tablet Take 30 mg by mouth at bedtime.    Marland Kitchen aspirin 81 MG tablet Chew by mouth daily.    . clonazePAM (KLONOPIN) 0.5 MG tablet Taking two tablets at bedtime and one additional tablet daily, if needed.    . Cyanocobalamin (VITAMIN B 12) 100 MCG LOZG Take by mouth daily.    . DiphenhydrAMINE HCl (BENADRYL PO) Take by mouth. Takes as needed for allergies.    Marland Kitchen insulin lispro protamine-lispro (HUMALOG 75/25 MIX) (75-25) 100 UNIT/ML SUSP injection Inject 12-14 Units into the skin 2 (two) times daily. Sliding scale depending on time of day and what eating.    . metFORMIN (GLUCOPHAGE-XR) 500 MG 24 hr tablet Take 1,000 mg by mouth 2 (two) times daily.  5  . methocarbamol (ROBAXIN) 500 MG tablet Take 1 tablet (500 mg total) by mouth every 6 (six) hours as needed for muscle spasms. 80 tablet 0  . pravastatin (PRAVACHOL) 20 MG tablet Take 20 mg by mouth daily after breakfast.    . ramipril (ALTACE) 5 MG capsule Take 5 mg by mouth at bedtime.    . sitaGLIPtin (JANUVIA) 100 MG tablet Take 100 mg by mouth daily with breakfast.    . traMADol (ULTRAM) 50 MG tablet Take 1-2 tablets (50-100 mg total) by mouth every 6 (six) hours as needed (mild pain). 80 tablet 1   No current facility-administered medications for this visit.     PAST MEDICAL HISTORY: Past Medical History:  Diagnosis Date  . Anxiety   . Arthritis   . Asthma    as a child  . Bipolar 1 disorder ([place])   . Cancer ([place])    hx breast cancer  . Depression   . Diabetes mellitus without complication ([place])    type 2  . Foot drop   . History of breast cancer 1995   lumpectomy and radiation left breast  . History of duodenal ulcer 1989  . Hyperlipidemia   . Hypertension   . Stress incontinence     PAST  SURGICAL HISTORY: Past Surgical History:  Procedure Laterality Date  . BREAST LUMPECTOMY  1995   left breast / radiation  . DILATION AND CURETTAGE OF UTERUS  1989  . JOINT REPLACEMENT    . KNEE ARTHROSCOPY     rt knee  . TONSILLECTOMY    . TOTAL KNEE ARTHROPLASTY Right 07/07/2014   Procedure: RIGHT TOTAL KNEE ARTHROPLASTY;  Surgeon: [name], MD;  Location: [facility];  Service: Orthopedics;  Laterality: Right;  . TOTAL KNEE ARTHROPLASTY Left 07/06/2015   Procedure: LEFT TOTAL KNEE ARTHROPLASTY;  Surgeon: Gaynelle Arabian, MD;  Location: [facility];  Service: Orthopedics;  Laterality: Left;    FAMILY HISTORY: Family History  Problem Relation Age of Onset  . Schizophrenia Mother   . Prostate cancer Father   . Aneurysm Father        stomach  . Diabetes Father   . Colitis Paternal Grandmother     SOCIAL HISTORY:  Social History   Socioeconomic History  . Marital  status: Married    Spouse name: Not on file  . Number of children: 1  . Years of education: Associates  . Highest education level: Not on file  Occupational History  . Occupation: Retired  Scientific laboratory technician  . Financial resource strain: Not on file  . Food insecurity:    Worry: Not on file    Inability: Not on file  . Transportation needs:    Medical: Not on file    Non-medical: Not on file  Tobacco Use  . Smoking status: Never Smoker  . Smokeless tobacco: Never Used  Substance and Sexual Activity  . Alcohol use: Yes    Comment: occasional  . Drug use: No  . Sexual activity: Not on file  Lifestyle  . Physical activity:    Days per week: Not on file    Minutes per session: Not on file  . Stress: Not on file  Relationships  . Social connections:    Talks on phone: Not on file    Gets together: Not on file    Attends religious service: Not on file    Active member of club or organization: Not on file    Attends meetings of clubs or organizations: Not on file    Relationship status: Not on file  . Intimate  partner violence:    Fear of current or ex partner: Not on file    Emotionally abused: Not on file    Physically abused: Not on file    Forced sexual activity: Not on file  Other Topics Concern  . Not on file  Social History Narrative   Lives at home with husband.   Right-handed.   3-4 cups caffeine.     PHYSICAL EXAM   Vitals:   08/15/17 1001  BP: 117/72  Pulse: 67  Weight: 171 lb (77.6 kg)  Height: 5' 4.75" (1.645 m)    Not recorded      Body mass index is 28.68 kg/m.  PHYSICAL EXAMNIATION:  Gen: NAD, conversant, well nourised, obese, well groomed                     Cardiovascular: Regular rate rhythm, no peripheral edema, warm, nontender. Eyes: Conjunctivae clear without exudates or hemorrhage Neck: Supple, no carotid bruits. Pulmonary: Clear to auscultation bilaterally   NEUROLOGICAL EXAM:  MENTAL STATUS: Speech:    Speech is normal; fluent and spontaneous with normal comprehension.  Cognition:     Orientation to time, place and person     Normal recent and remote memory     Normal Attention span and concentration     Normal Language, naming, repeating,spontaneous speech     Fund of knowledge   CRANIAL NERVES: CN II: Visual fields are full to confrontation. Fundoscopic exam is normal with sharp discs and no vascular changes. Pupils are round equal and briskly reactive to light. CN III, IV, VI: extraocular movement are normal. No ptosis. CN V: Facial sensation is intact to pinprick in all 3 divisions bilaterally. Corneal responses are intact.  CN VII: Face is symmetric with normal eye closure and smile. CN VIII: Hearing is normal to rubbing fingers CN IX, X: Palate elevates symmetrically. Phonation is normal. CN XI: Head turning and shoulder shrug are intact CN XII: Tongue is midline with normal movements and no atrophy.  MOTOR: She has mild bilateral upper extremity rigidity, slight right ankle dorsiflexion weakness,  REFLEXES: Reflexes are 3 and  symmetric at the biceps, triceps, knees, and ankles. Plantar  responses are flexor.  SENSORY: Intact to light touch, pinprick, positional sensation and vibratory sensation are intact in fingers and toes.  COORDINATION: Rapid alternating movements and fine finger movements are intact. There is no dysmetria on finger-to-nose and heel-knee-shin.    GAIT/STANCE: Deliberate effort, wide-based, cautious,  DIAGNOSTIC DATA (LABS, IMAGING, TESTING) - I reviewed patient records, labs, notes, testing and imaging myself where available.   ASSESSMENT AND PLAN  [name] is a 72 y.o. female   Gait abnormality, falling episode Worsening memory loss  Multifactorial, bilateral knee replacement, joints pain, deconditioning, medication side effect, underlying mood disorder  Proceed with MRI of the brain  Laboratory evaluations  Refer her to physical therapy       Marcial Pacas, M.D. Ph.D.  [facility] [address] [place], [place] [address] Ph: [phone] Fax: [phone]  CC: Darcus Austin, MD

## 2017-08-16 ENCOUNTER — Telehealth: Payer: Self-pay | Admitting: Neurology

## 2017-08-16 LAB — CBC WITH DIFFERENTIAL/PLATELET
BASOS ABS: 0.1 10*3/uL (ref 0.0–0.2)
Basos: 2 %
EOS (ABSOLUTE): 0.4 10*3/uL (ref 0.0–0.4)
Eos: 7 %
Hematocrit: 35 % (ref 34.0–46.6)
Hemoglobin: 11.5 g/dL (ref 11.1–15.9)
IMMATURE GRANS (ABS): 0 10*3/uL (ref 0.0–0.1)
IMMATURE GRANULOCYTES: 0 %
LYMPHS: 13 %
Lymphocytes Absolute: 0.6 10*3/uL — ABNORMAL LOW (ref 0.7–3.1)
MCH: 27.5 pg (ref 26.6–33.0)
MCHC: 32.9 g/dL (ref 31.5–35.7)
MCV: 84 fL (ref 79–97)
Monocytes Absolute: 0.4 10*3/uL (ref 0.1–0.9)
Monocytes: 8 %
NEUTROS ABS: 3.7 10*3/uL (ref 1.4–7.0)
Neutrophils: 70 %
PLATELETS: 297 10*3/uL (ref 150–379)
RBC: 4.18 x10E6/uL (ref 3.77–5.28)
RDW: 15.3 % (ref 12.3–15.4)
WBC: 5.1 10*3/uL (ref 3.4–10.8)

## 2017-08-16 LAB — TSH: TSH: 1.5 u[IU]/mL (ref 0.450–4.500)

## 2017-08-16 LAB — COMPREHENSIVE METABOLIC PANEL
ALBUMIN: 4.1 g/dL (ref 3.5–4.8)
ALT: 14 IU/L (ref 0–32)
AST: 16 IU/L (ref 0–40)
Albumin/Globulin Ratio: 1.7 (ref 1.2–2.2)
Alkaline Phosphatase: 48 IU/L (ref 39–117)
BUN / CREAT RATIO: 21 (ref 12–28)
BUN: 15 mg/dL (ref 8–27)
CALCIUM: 9.9 mg/dL (ref 8.7–10.3)
CHLORIDE: 93 mmol/L — AB (ref 96–106)
CO2: 27 mmol/L (ref 20–29)
CREATININE: 0.7 mg/dL (ref 0.57–1.00)
GFR, EST AFRICAN AMERICAN: 100 mL/min/{1.73_m2} (ref >59)
GFR, EST NON AFRICAN AMERICAN: 87 mL/min/{1.73_m2} (ref >59)
GLUCOSE: 121 mg/dL — AB (ref 65–99)
Globulin, Total: 2.4 g/dL (ref 1.5–4.5)
Potassium: 5.2 mmol/L (ref 3.5–5.2)
Sodium: 133 mmol/L — ABNORMAL LOW (ref 134–144)
TOTAL PROTEIN: 6.5 g/dL (ref 6.0–8.5)

## 2017-08-16 LAB — RPR: RPR Ser Ql: NONREACTIVE

## 2017-08-16 LAB — HGB A1C W/O EAG: Hgb A1c MFr Bld: 7.8 % — ABNORMAL HIGH (ref 4.8–5.6)

## 2017-08-16 LAB — VITAMIN B12: VITAMIN B 12: 1599 pg/mL — AB (ref 232–1245)

## 2017-08-16 NOTE — Telephone Encounter (Signed)
Spoke to both patient and husband - they are aware of the results.  She had a recent appt w/ her endocrinologist and her A1C was elevated at that appt as well.  They have developed a plan to improve control of her diabetes.

## 2017-08-16 NOTE — Telephone Encounter (Signed)
Please call patient, extended laboratory evaluation showed elevated A1c 7.8, she should contact her primary care physician for better control of her diabetes,  There was also evidence of mildly low sodium 133, which is about her baseline  Rest of the laboratory evaluations were normal

## 2017-08-17 ENCOUNTER — Ambulatory Visit: Payer: Medicare Other

## 2017-08-17 ENCOUNTER — Ambulatory Visit: Payer: Medicare Other | Attending: Neurology | Admitting: Physical Therapy

## 2017-08-17 ENCOUNTER — Encounter: Payer: Self-pay | Admitting: Physical Therapy

## 2017-08-17 ENCOUNTER — Encounter: Payer: Self-pay | Admitting: Neurology

## 2017-08-17 DIAGNOSIS — M6281 Muscle weakness (generalized): Secondary | ICD-10-CM

## 2017-08-17 DIAGNOSIS — R2681 Unsteadiness on feet: Secondary | ICD-10-CM | POA: Diagnosis not present

## 2017-08-17 DIAGNOSIS — R2689 Other abnormalities of gait and mobility: Secondary | ICD-10-CM | POA: Diagnosis not present

## 2017-08-17 NOTE — Therapy (Addendum)
[facility] [address] [place], [place], [address] Phone: [phone]   Fax:  [phone]  Physical Therapy Evaluation  Patient Details  Name: [name] MRN: [id number] Date of Birth: 11-Jul-1945 Referring Provider: Marcial Pacas   Encounter Date: 08/17/2017  PT End of Session - 08/17/17 1523    Visit Number  1    Number of Visits  17    Date for PT Re-Evaluation  11/15/17    Authorization Type  Medicare and Federal    PT Start Time  5885    PT Stop Time  0277    PT Time Calculation (min)  43 min    Activity Tolerance  Patient tolerated treatment well    Behavior During Therapy  [facility] for tasks assessed/performed       Past Medical History:  Diagnosis Date  . Anxiety   . Arthritis   . Asthma    as a child  . Bipolar 1 disorder ([place])   . Cancer ([place])    hx breast cancer  . Depression   . Diabetes mellitus without complication ([place])    type 2  . Foot drop   . History of breast cancer 1995   lumpectomy and radiation left breast  . History of duodenal ulcer 1989  . Hyperlipidemia   . Hypertension   . Stress incontinence     Past Surgical History:  Procedure Laterality Date  . BREAST LUMPECTOMY  1995   left breast / radiation  . DILATION AND CURETTAGE OF UTERUS  1989  . JOINT REPLACEMENT    . KNEE ARTHROSCOPY     rt knee  . TONSILLECTOMY    . TOTAL KNEE ARTHROPLASTY Right 07/07/2014   Procedure: RIGHT TOTAL KNEE ARTHROPLASTY;  Surgeon: [name], MD;  Location: [facility];  Service: Orthopedics;  Laterality: Right;  . TOTAL KNEE ARTHROPLASTY Left 07/06/2015   Procedure: LEFT TOTAL KNEE ARTHROPLASTY;  Surgeon: Gaynelle Arabian, MD;  Location: [facility];  Service: Orthopedics;  Laterality: Left;    There were no vitals filed for this visit.   Subjective Assessment - 08/17/17 1409    Subjective  Dr. Krista Blue said I have an uneven gait and memory loss.  I've had a fall walking around the neighborhood; had  to have neighbor help get me up.  Almost fell again in [facility], but I did not.  Just describes "feeling like I'm going to fall."  Does not use assistive.  Husband reports that in the past month, she has significantly reduced walking speed and endurance and is having difficulty getting out of bed, in and out of car and up and down from chair.    Patient is accompained by:  Family member Very end of session-husband, [name]    Pertinent History  L TKR 06/2015, R TKR 2016    Patient Stated Goals  Pt's goals for therapy are to not fall anymore.    Currently in Pain?  No/denies         [facility] PT Assessment - 08/17/17 1415      Assessment   Medical Diagnosis  gait abnormality, memory loss    Referring Provider  Marcial Pacas    Onset Date/Surgical Date  08/15/17 MD visit    Prior Therapy  -- Spring 2018      Precautions   Precautions  Fall      Balance Screen   Has the patient fallen in the past 6 months  Yes  How many times?  1    Has the patient had a decrease in activity level because of a fear of falling?   Yes    Is the patient reluctant to leave their home because of a fear of falling?   No      Home Film/video editor residence    Living Arrangements  Spouse/significant other    Available Help at Discharge  Family    Type of [place] to enter    Entrance Stairs-Number of Steps  2    Entrance Stairs-Rails  None    Home Layout  Two level;Bed/bath upstairs    Alternate Level Stairs-Number of Steps  14    Alternate Level Stairs-Rails  Right;Left      Prior Function   Level of Independence  Independent    Vocation  Retired    Leisure  Enjoys walking, exercise class at [employer], singing in choir, riding exercise bike daily at home      Observation/Other Assessments   Focus on Therapeutic Outcomes (FOTO)   NA      ROM / Strength   AROM / PROM / Strength  Strength;AROM      AROM   Overall AROM Comments  decreased ability to  fully straighten R and L knee in sitting (hamstring tightness)      Strength   Overall Strength Comments  Grossly tested bilateral lower extremities 4/5 throughout.  Decreased functional lower extremity strength, with difficulty with sit to stand transfers.      Transfers   Transfers  Sit to Stand;Stand to Sit    Sit to Stand  6: Modified independent (Device/Increase time);Without upper extremity assist;From chair/3-in-1    Five time sit to stand comments   16.69    Stand to Sit  6: Modified independent (Device/Increase time);Without upper extremity assist;To chair/3-in-1      Ambulation/Gait   Ambulation/Gait  Yes    Ambulation/Gait Assistance  5: Supervision    Ambulation Distance (Feet)  200 Feet    Assistive device  None    Gait Pattern  Step-through pattern;Decreased arm swing - left;Decreased step length - right;Decreased stance time - right;Right flexed knee in stance;Decreased trunk rotation;Poor foot clearance - right discoordinated arm swing    Ambulation Surface  Level;Indoor    Gait velocity  13.78 sec = 2.38 ft/sec      Standardized Balance Assessment   Standardized Balance Assessment  Timed Up and Go Test;Dynamic Gait Index      Dynamic Gait Index   Level Surface  Moderate Impairment    Change in Gait Speed  Moderate Impairment    Gait with Horizontal Head Turns  Moderate Impairment    Gait with Vertical Head Turns  Moderate Impairment    Gait and Pivot Turn  Mild Impairment    Step Over Obstacle  Moderate Impairment    Step Around Obstacles  Moderate Impairment    Steps  Mild Impairment    Total Score  10    DGI comment:  Scores <19/24 indicate increased fall risk      Timed Up and Go Test   Normal TUG (seconds)  17.22    TUG Comments  Scores >13.5 seconds indicate increased fall risk.      High Level Balance   High Level Balance Comments  Pt able to stand EO and EC on solid surface 30 seconds; stands EO on foam 20 seconds, EC  on foam with increased sway 15 sec  (indiciates possible decreased vestibular system use for balance)                Objective measurements completed on examination: See above findings.                PT Short Term Goals - 08/17/17 1535      PT SHORT TERM GOAL #1   Title  Pt will perform HEP with husband's supervision for improved functional mobility, balance, gait.  TARGET 09/15/17    Time  4    Period  Weeks    Status  New    Target Date  09/15/17      PT SHORT TERM GOAL #2   Title  Pt will improve 5x sit<>stand in less than or equal to 15 seconds to demonstrate improved functional strength.    Time  4    Period  Weeks    Status  New    Target Date  09/15/17      PT SHORT TERM GOAL #3   Title  Pt will improve TUG score to less than or equal to 13.5 seconds for decreased fall risk.    Time  4    Period  Weeks    Status  New    Target Date  09/15/17      PT SHORT TERM GOAL #4   Title  Pt/husband will report 50% improvement in bed mobility/car transfer tasks for improved functional mobility.    Time  4    Period  Weeks    Status  New    Target Date  09/15/17        PT Long Term Goals - 08/17/17 1537      PT LONG TERM GOAL #1   Title  Pt will verbalize understanding of fall prevention in home environment.  TARGET 11/15/17 (due to pt being out of town for >1 month during [place])    Time  8    Period  Weeks    Status  New    Target Date  11/15/17      PT LONG TERM GOAL #2   Title  Pt will perform 5x sit<>stand in less than or equal to 13 seconds for improved functional lower extremity strength.    Time  8    Period  Weeks    Status  New    Target Date  11/15/17      PT LONG TERM GOAL #3   Title  Pt will improve Dynamic Gait index score to at least 15/24 for decreased fall risk.    Time  8    Status  New    Target Date  11/15/17      PT LONG TERM GOAL #4   Title  Pt will improve gait velocity to at least 2.62 ft/sec for improved gait efficiency and safety.    Time  8    Period   Weeks    Status  New    Target Date  11/15/17             Plan - 08/17/17 1526    Clinical Impression Statement  Pt is a 72 year old female who presents to OP PT with gait abnormality, with recent decline in functional mobility and gait over recent months.  Pt describes one fall in past 6 months, with another near fall.  Pt has been seen in this clinic for gait and balance approximately one year ago,  and mobility measures have slowed since that time.  She presents with decreased balance, abnormal posture, abnormality of gait, decreased functional strength.  She is at fall risk per TUG and DGI scores.  Pt will benefit from skilled PT to address the above stated deficits to decrease fall risk and improve functional mobility.    History and Personal Factors relevant to plan of care:  PMH >3 co-morbidities, 1 fall in past 6 months    Clinical Presentation  Stable    Clinical Presentation due to:  memory loss, functional mobility decline since PT 1 year ago    Clinical Decision Making  Low    Rehab Potential  Good    Clinical Impairments Affecting Rehab Potential  memory loss/supportive spouse; has had therapy in the past    PT Frequency  2x / week    PT Duration  8 weeks    PT Treatment/Interventions  ADLs/Self Care Home Management;DME Instruction;Balance training;Therapeutic exercise;Therapeutic activities;Functional mobility training;Gait training;Neuromuscular re-education;Patient/family education    PT Next Visit Plan  Review pt's previous therapy HEP and add to HEP as able for strength, balance, posture (pt doing out of town from 09/04/17-mid-June, so really need to establish HEP)    Consulted and Agree with Plan of Care  Patient       Patient will benefit from skilled therapeutic intervention in order to improve the following deficits and impairments:  Abnormal gait, Decreased balance, Decreased mobility, Difficulty walking, Decreased strength, Postural dysfunction  Visit  Diagnosis: Other abnormalities of gait and mobility  Unsteadiness on feet  Muscle weakness (generalized)     Problem List Patient Active Problem List   Diagnosis Date Noted  . Gait abnormality 08/15/2017  . Memory loss 08/15/2017  . Chronic low back pain 07/01/2016  . Abnormality of gait 05/12/2016  . Paresthesia 05/12/2016  . OA (osteoarthritis) of knee 07/07/2014    Edmond Ginsberg W. 08/17/2017, 3:41 PM  Frazier Butt., PT  [facility] [address] [place], [place], [address] Phone: [phone]   Fax:  [phone]  Name: [name] MRN: [id number] Date of Birth: 09-15-1945

## 2017-08-18 DIAGNOSIS — N950 Postmenopausal bleeding: Secondary | ICD-10-CM | POA: Diagnosis not present

## 2017-08-21 ENCOUNTER — Encounter: Payer: Self-pay | Admitting: Physical Therapy

## 2017-08-21 ENCOUNTER — Ambulatory Visit: Payer: Medicare Other | Admitting: Physical Therapy

## 2017-08-21 DIAGNOSIS — M6281 Muscle weakness (generalized): Secondary | ICD-10-CM | POA: Diagnosis not present

## 2017-08-21 DIAGNOSIS — R2689 Other abnormalities of gait and mobility: Secondary | ICD-10-CM | POA: Diagnosis not present

## 2017-08-21 DIAGNOSIS — R2681 Unsteadiness on feet: Secondary | ICD-10-CM | POA: Diagnosis not present

## 2017-08-21 NOTE — Patient Instructions (Addendum)
Feet Apart, Head Motion - Eyes Open    With eyes open, feet apart, move head slowly: up and down. Repeat __5 times___ per session.  Then turn head side to side 5 times.   Do _1-2___ sessions per day.  Copyright  VHI. All rights reserved.  Feet Together, Head Motion - Eyes Open    With eyes open, feet together, move head slowly: up and down. Repeat __5__ times per session.  Then move head side to side 5 times.  Do _1-2___ sessions per day.  Copyright  VHI. All rights reserved.  Toe / Heel Raise (Standing)    Standing with support, raise heels, then rock back on heels and raise toes. Repeat _10-20___ times.  Copyright  VHI. All rights reserved.

## 2017-08-22 ENCOUNTER — Encounter: Payer: Self-pay | Admitting: Physical Therapy

## 2017-08-22 ENCOUNTER — Ambulatory Visit: Payer: Medicare Other | Admitting: Physical Therapy

## 2017-08-22 DIAGNOSIS — R2681 Unsteadiness on feet: Secondary | ICD-10-CM | POA: Diagnosis not present

## 2017-08-22 DIAGNOSIS — R2689 Other abnormalities of gait and mobility: Secondary | ICD-10-CM | POA: Diagnosis not present

## 2017-08-22 DIAGNOSIS — M6281 Muscle weakness (generalized): Secondary | ICD-10-CM

## 2017-08-22 NOTE — Patient Instructions (Addendum)
Perform these at a counter top or long table so to hold on for safety: "I love a [employer] on with one hand on counter/table:  Slowly march forward along counter and then slowly march backwards along the counter. Remember to "lift the knee up" each time. 3 laps each way. 1-2 times a day.  http://gt2.exer.us/345   Copyright  VHI. All rights reserved.    Feet Heel-Toe "Tandem"    Holding onto the counter/table, walk a straight line forward along the counter by bringing one foot directly in front of the other (heel to toe). Turn around at the end of the counter and walk a straight line back to the other end (heel to toe).  Repeat for 4 laps per session. Do _1-2_ sessions per day.  Copyright  VHI. All rights reserved.   Walking on [employer] onto the counter top: Walk forward along counter while on your toes, when at the end of the counter walk backwards on you toes.  Perform for 3 laps each way.  Do _1-2_ sessions per day.  Copyright  VHI. All rights reserved.    Single Leg - Eyes Open    Holding counter for balance: While standing on the right leg, lift the left one up as shown. Hold for 10 seconds. Then switch legs so you are standing on the left leg with right leg lifted. Hold__10__ seconds  Perform 3 stands with each leg. Do _1-2_ sessions per day.  Copyright  VHI. All rights reserved.

## 2017-08-22 NOTE — Therapy (Signed)
[facility] [address] [place], [place], [address] Phone: [phone]   Fax:  [phone]  Physical Therapy Treatment  Patient Details  Name: [name] MRN: [id number] Date of Birth: [birth year] Referring Provider: Marcial Pacas   Encounter Date: 08/21/2017  PT End of Session - 08/22/17 0803    Visit Number  2    Number of Visits  17    Date for PT Re-Evaluation  11/15/17    Authorization Type  Medicare and Federal    PT Start Time  1408    PT Stop Time  1447    PT Time Calculation (min)  39 min    Activity Tolerance  Patient tolerated treatment well    Behavior During Therapy  [facility] for tasks assessed/performed       Past Medical History:  Diagnosis Date  . Anxiety   . Arthritis   . Asthma    as a child  . Bipolar 1 disorder ([place])   . Cancer ([place])    hx breast cancer  . Depression   . Diabetes mellitus without complication ([place])    type 2  . Foot drop   . History of breast cancer 1995   lumpectomy and radiation left breast  . History of duodenal ulcer 1989  . Hyperlipidemia   . Hypertension   . Stress incontinence     Past Surgical History:  Procedure Laterality Date  . BREAST LUMPECTOMY  1995   left breast / radiation  . DILATION AND CURETTAGE OF UTERUS  1989  . JOINT REPLACEMENT    . KNEE ARTHROSCOPY     rt knee  . TONSILLECTOMY    . TOTAL KNEE ARTHROPLASTY Right 07/07/2014   Procedure: RIGHT TOTAL KNEE ARTHROPLASTY;  Surgeon: [name], MD;  [facility];  Service: Orthopedics;  Laterality: Right;  . TOTAL KNEE ARTHROPLASTY Left 07/06/2015   Procedure: LEFT TOTAL KNEE ARTHROPLASTY;  Surgeon: Gaynelle Arabian, MD;  [facility];  Service: Orthopedics;  Laterality: Left;    There were no vitals filed for this visit.  Subjective Assessment - 08/21/17 1410    Subjective  No new changes since last visit.  Had a good weekend; stood with choir for two specials.  Had some high blood  sugars over the weekend.    Patient is accompained by:  Family member Very end of session-husband, Dave    Pertinent History  L TKR 06/2015, R TKR 2016    Patient Stated Goals  Pt's goals for therapy are to not fall anymore.    Currently in Pain?  No/denies                            Balance Exercises - 08/21/17 1414      Balance Exercises: Standing   Standing Eyes Opened  Wide (BOA);Solid surface;5 reps;Head turns;Narrow base of support (BOS) Head nods; foam surface EO head turns/nods    Standing Eyes Closed  Narrow base of support (BOS);Head turns;5 reps;Solid surface HEad nods; then EC hold head steady 10 seconds     Wall Bumps  Hip    Wall Bumps-Hips  Eyes opened;Anterior/posterior;10 reps    Stepping Strategy  Anterior;Posterior;Lateral;UE support;10 reps [employer] Limitations  on foam, in place x 10 reps, 2 sets     Heel Raises Limitations  10 rep    Toe Raise Limitations  10 reps    Other  Standing Exercises  Discussed relevance of ankle, hip, step strategy exercises for balance recovery.      Discussed bed mobility, getting to and from bathroom at night, including importance of light for path from bed to bathroom (currently there is no nightlight and she doesn't turn on lamp due to being afraid she'll wake husband.)  PT Education - 08/22/17 0802    Education provided  Yes    Education Details  HEP-see instructions; discussed compensations for balance-using light at night    Person(s) Educated  Patient    Methods  Explanation;Demonstration;Handout    Comprehension  Verbalized understanding;Returned demonstration;Verbal cues required       PT Short Term Goals - 08/17/17 1535      PT SHORT TERM GOAL #1   Title  Pt will perform HEP with husband's supervision for improved functional mobility, balance, gait.  TARGET 09/15/17    Time  4    Period  Weeks    Status  New    Target Date  09/15/17      PT SHORT TERM GOAL #2   Title  Pt will  improve 5x sit<>stand in less than or equal to 15 seconds to demonstrate improved functional strength.    Time  4    Period  Weeks    Status  New    Target Date  09/15/17      PT SHORT TERM GOAL #3   Title  Pt will improve TUG score to less than or equal to 13.5 seconds for decreased fall risk.    Time  4    Period  Weeks    Status  New    Target Date  09/15/17      PT SHORT TERM GOAL #4   Title  Pt/husband will report 50% improvement in bed mobility/car transfer tasks for improved functional mobility.    Time  4    Period  Weeks    Status  New    Target Date  09/15/17        PT Long Term Goals - 08/17/17 1537      PT LONG TERM GOAL #1   Title  Pt will verbalize understanding of fall prevention in home environment.  TARGET 11/15/17 (due to pt being out of town for >1 month during [place])    Time  8    Period  Weeks    Status  New    Target Date  11/15/17      PT LONG TERM GOAL #2   Title  Pt will perform 5x sit<>stand in less than or equal to 13 seconds for improved functional lower extremity strength.    Time  8    Period  Weeks    Status  New    Target Date  11/15/17      PT LONG TERM GOAL #3   Title  Pt will improve Dynamic Gait index score to at least 15/24 for decreased fall risk.    Time  8    Status  New    Target Date  11/15/17      PT LONG TERM GOAL #4   Title  Pt will improve gait velocity to at least 2.62 ft/sec for improved gait efficiency and safety.    Time  8    Period  Weeks    Status  New    Target Date  11/15/17            Plan - 08/22/17 3009  Clinical Impression Statement  Initiated HEP this visit, for corner balance exercises.  Attempted both solid surface and compliant surface, but HEP consists of solid surface.  On compliant surface without UE support, pt experiences posterior LOB x 2, needing therapist cues to grab chair to regain balance.  Pt will continue to benefit from skilled PT to address balance and gait.    Rehab Potential   Good    Clinical Impairments Affecting Rehab Potential  memory loss/supportive spouse; has had therapy in the past    PT Frequency  2x / week    PT Duration  8 weeks    PT Treatment/Interventions  ADLs/Self Care Home Management;DME Instruction;Balance training;Therapeutic exercise;Therapeutic activities;Functional mobility training;Gait training;Neuromuscular re-education;Patient/family education    PT Next Visit Plan  Review HEP; continue to work on balance, functional strength and gait.    Consulted and Agree with Plan of Care  Patient       Patient will benefit from skilled therapeutic intervention in order to improve the following deficits and impairments:  Abnormal gait, Decreased balance, Decreased mobility, Difficulty walking, Decreased strength, Postural dysfunction  Visit Diagnosis: Unsteadiness on feet  Other abnormalities of gait and mobility     Problem List Patient Active Problem List   Diagnosis Date Noted  . Gait abnormality 08/15/2017  . Memory loss 08/15/2017  . Chronic low back pain 07/01/2016  . Abnormality of gait 05/12/2016  . Paresthesia 05/12/2016  . OA (osteoarthritis) of knee 07/07/2014    Martika Egler W. 08/22/2017, 8:52 AM  Frazier Butt., PT   [facility] [address] [place], [place], [address] Phone: [phone]   Fax:  [phone]  Name: [name] MRN: [id number] Date of Birth: [birth year]

## 2017-08-22 NOTE — Therapy (Signed)
[facility] [address] [place], [place], [address] Phone: [phone]   Fax:  [phone]  Physical Therapy Treatment  Patient Details  Name: [name] MRN: [id number] Date of Birth: [birth year] Referring Provider: Marcial Pacas   Encounter Date: 08/22/2017  PT End of Session - 08/22/17 1020    Visit Number  3    Number of Visits  17    Date for PT Re-Evaluation  11/15/17    Authorization Type  Medicare and Federal    PT Start Time  1018    PT Stop Time  1058    PT Time Calculation (min)  40 min    Equipment Utilized During Treatment  Gait belt    Activity Tolerance  Patient tolerated treatment well    Behavior During Therapy  WFL for tasks assessed/performed       Past Medical History:  Diagnosis Date  . Anxiety   . Arthritis   . Asthma    as a child  . Bipolar 1 disorder ([place])   . Cancer ([place])    hx breast cancer  . Depression   . Diabetes mellitus without complication ([place])    type 2  . Foot drop   . History of breast cancer 1995   lumpectomy and radiation left breast  . History of duodenal ulcer 1989  . Hyperlipidemia   . Hypertension   . Stress incontinence     Past Surgical History:  Procedure Laterality Date  . BREAST LUMPECTOMY  1995   left breast / radiation  . DILATION AND CURETTAGE OF UTERUS  1989  . JOINT REPLACEMENT    . KNEE ARTHROSCOPY     rt knee  . TONSILLECTOMY    . TOTAL KNEE ARTHROPLASTY Right 07/07/2014   Procedure: RIGHT TOTAL KNEE ARTHROPLASTY;  Surgeon: [name], MD;  Location: [facility];  Service: Orthopedics;  Laterality: Right;  . TOTAL KNEE ARTHROPLASTY Left 07/06/2015   Procedure: LEFT TOTAL KNEE ARTHROPLASTY;  Surgeon: Gaynelle Arabian, MD;  Location: [facility];  Service: Orthopedics;  Laterality: Left;    There were no vitals filed for this visit.  Subjective Assessment - 08/22/17 1014    Subjective  No new complaitns. Did her HEP this am at the counter. No  pain to report.    Pertinent History  L TKR 06/2015, R TKR 2016    Patient Stated Goals  Pt's goals for therapy are to not fall anymore.    Currently in Pain?  No/denies        [facility] Adult PT Treatment/Exercise - 08/22/17 1032      Neuro Re-ed    Neuro Re-ed Details   reviewed corner balance HEP issued at previous session with supervision for balance. cues to keep eyes open at this time. no balance loss noted, postural sway noted.           Balance Exercises - 08/22/17 1047      Balance Exercises: Standing   Balance Beam  standing across blue foam beam: alternating fwd stepping to floor/back onto beam, then alternating backward stepping to floor/back onto beam. 10-12 reps each leg with no UE support. min guard to min assist for balance with cues on posture, step length, step height and weight shifitng to assist with balance.       issued the following to HEP today. Min guard assist for balance. Cues on posture and ex form. Perform these at a counter top or long table so to  hold on for safety: "I love a [employer] worker on with one hand on counter/table:  Slowly march forward along counter and then slowly march backwards along the counter. Remember to "lift the knee up" each time. 3 laps each way. 1-2 times a day.  http://gt2.exer.us/345   Copyright  VHI. All rights reserved.    Feet Heel-Toe "Tandem"    Holding onto the counter/table, walk a straight line forward along the counter by bringing one foot directly in front of the other (heel to toe). Turn around at the end of the counter and walk a straight line back to the other end (heel to toe).  Repeat for 4 laps per session. Do _1-2_ sessions per day.  Copyright  VHI. All rights reserved.   Walking on [employer] onto the counter top: Walk forward along counter while on your toes, when at the end of the counter walk backwards on you toes.  Perform for 3 laps each way.  Do _1-2_ sessions per  day.  Copyright  VHI. All rights reserved.    Single Leg - Eyes Open    Holding counter for balance: While standing on the right leg, lift the left one up as shown. Hold for 10 seconds. Then switch legs so you are standing on the left leg with right leg lifted. Hold__10__ seconds  Perform 3 stands with each leg. Do _1-2_ sessions per day.  Copyright  VHI. All rights reserved.    PT Education - 08/22/17 1045    Education provided  Yes    Education Details  added additional ex'es to HEP to address balance/LE strengthening    Person(s) Educated  Patient    Methods  Explanation;Demonstration;Verbal cues;Handout    Comprehension  Verbalized understanding;Verbal cues required;Need further instruction;Returned demonstration       PT Short Term Goals - 08/17/17 1535      PT SHORT TERM GOAL #1   Title  Pt will perform HEP with husband's supervision for improved functional mobility, balance, gait.  TARGET 09/15/17    Time  4    Period  Weeks    Status  New    Target Date  09/15/17      PT SHORT TERM GOAL #2   Title  Pt will improve 5x sit<>stand in less than or equal to 15 seconds to demonstrate improved functional strength.    Time  4    Period  Weeks    Status  New    Target Date  09/15/17      PT SHORT TERM GOAL #3   Title  Pt will improve TUG score to less than or equal to 13.5 seconds for decreased fall risk.    Time  4    Period  Weeks    Status  New    Target Date  09/15/17      PT SHORT TERM GOAL #4   Title  Pt/husband will report 50% improvement in bed mobility/car transfer tasks for improved functional mobility.    Time  4    Period  Weeks    Status  New    Target Date  09/15/17        PT Long Term Goals - 08/17/17 1537      PT LONG TERM GOAL #1   Title  Pt will verbalize understanding of fall prevention in home environment.  TARGET 11/15/17 (due to pt being out of town for >1 month during [place])  Time  8    Period  Weeks    Status  New    Target  Date  11/15/17      PT LONG TERM GOAL #2   Title  Pt will perform 5x sit<>stand in less than or equal to 13 seconds for improved functional lower extremity strength.    Time  8    Period  Weeks    Status  New    Target Date  11/15/17      PT LONG TERM GOAL #3   Title  Pt will improve Dynamic Gait index score to at least 15/24 for decreased fall risk.    Time  8    Status  New    Target Date  11/15/17      PT LONG TERM GOAL #4   Title  Pt will improve gait velocity to at least 2.62 ft/sec for improved gait efficiency and safety.    Time  8    Period  Weeks    Status  New    Target Date  11/15/17            Plan - 08/22/17 1020    Clinical Impression Statement  Today's skilled session focused on review of corner balance HEP issued at last session. Then focused on additions to HEP to further address balance with no issues reported in session. Remainder of session continued to focus on balance reactions/stepping strategies. Pt is making steady progress toward goals and should benefit from continued PT to progress toward unmet goals.     Rehab Potential  Good    Clinical Impairments Affecting Rehab Potential  memory loss/supportive spouse; has had therapy in the past    PT Frequency  2x / week    PT Duration  8 weeks    PT Treatment/Interventions  ADLs/Self Care Home Management;DME Instruction;Balance training;Therapeutic exercise;Therapeutic activities;Functional mobility training;Gait training;Neuromuscular re-education;Patient/family education    PT Next Visit Plan  continue to work on balance, functional strength and gait.    Consulted and Agree with Plan of Care  Patient       Patient will benefit from skilled therapeutic intervention in order to improve the following deficits and impairments:  Abnormal gait, Decreased balance, Decreased mobility, Difficulty walking, Decreased strength, Postural dysfunction  Visit Diagnosis: Unsteadiness on feet  Other abnormalities of  gait and mobility  Muscle weakness (generalized)     Problem List Patient Active Problem List   Diagnosis Date Noted  . Gait abnormality 08/15/2017  . Memory loss 08/15/2017  . Chronic low back pain 07/01/2016  . Abnormality of gait 05/12/2016  . Paresthesia 05/12/2016  . OA (osteoarthritis) of knee 07/07/2014    Willow Ora, PTA, [facility] [address] [place], [place] [address] [phone] 08/22/17, 2:45 PM   Name: [name] MRN: [id number] Date of Birth: [birth year]

## 2017-08-24 ENCOUNTER — Ambulatory Visit
Admission: RE | Admit: 2017-08-24 | Discharge: 2017-08-24 | Disposition: A | Discharge status: Home / Self Care | Payer: Medicare Other | Source: Ambulatory Visit | Attending: Neurology | Admitting: Neurology

## 2017-08-24 DIAGNOSIS — R413 Other amnesia: Secondary | ICD-10-CM | POA: Diagnosis not present

## 2017-08-25 ENCOUNTER — Telehealth: Payer: Self-pay | Admitting: Neurology

## 2017-08-25 NOTE — Telephone Encounter (Signed)
  I called the patient.  MRI of the brain shows mild chronic white matter changes, no acute changes were seen.  The patient indicates the day that she fell when she got back home her blood sugar was 60, the patient may have had a hypoglycemic event.  MRI brain 08/25/17:  IMPRESSION:  This MRI of the brain without contrast shows the following: 1.  Some scattered T2/FLAIR hyperintense foci mostly in the deep white matter of the hemispheres consistent with mild chronic microvascular ischemic change..    2.  Mild generalized cortical atrophy, slightly more than typical for age.

## 2017-08-28 ENCOUNTER — Ambulatory Visit: Payer: Medicare Other | Admitting: Physical Therapy

## 2017-08-28 ENCOUNTER — Encounter: Payer: Self-pay | Admitting: Physical Therapy

## 2017-08-28 DIAGNOSIS — R2689 Other abnormalities of gait and mobility: Secondary | ICD-10-CM | POA: Diagnosis not present

## 2017-08-28 DIAGNOSIS — R2681 Unsteadiness on feet: Secondary | ICD-10-CM | POA: Diagnosis not present

## 2017-08-28 DIAGNOSIS — M6281 Muscle weakness (generalized): Secondary | ICD-10-CM | POA: Diagnosis not present

## 2017-08-28 NOTE — Therapy (Signed)
[facility] [address] [place], [place], [address] Phone: [phone]   Fax:  [phone]  Physical Therapy Treatment  Patient Details  Name: [name] MRN: [id number] Date of Birth: [birth year] Referring Provider: Marcial Pacas   Encounter Date: 08/28/2017  PT End of Session - 08/28/17 2049    Visit Number  4    Number of Visits  17    Date for PT Re-Evaluation  11/15/17    Authorization Type  Medicare and Federal BCBS    PT Start Time  1536    PT Stop Time  1617    PT Time Calculation (min)  41 min    Equipment Utilized During Treatment  Gait belt    Activity Tolerance  Patient tolerated treatment well    Behavior During Therapy  [facility] for tasks assessed/performed       Past Medical History:  Diagnosis Date  . Anxiety   . Arthritis   . Asthma    as a child  . Bipolar 1 disorder ([place])   . Cancer ([place])    hx breast cancer  . Depression   . Diabetes mellitus without complication ([place])    type 2  . Foot drop   . History of breast cancer 1995   lumpectomy and radiation left breast  . History of duodenal ulcer 1989  . Hyperlipidemia   . Hypertension   . Stress incontinence     Past Surgical History:  Procedure Laterality Date  . BREAST LUMPECTOMY  1995   left breast / radiation  . DILATION AND CURETTAGE OF UTERUS  1989  . JOINT REPLACEMENT    . KNEE ARTHROSCOPY     rt knee  . TONSILLECTOMY    . TOTAL KNEE ARTHROPLASTY Right 07/07/2014   Procedure: RIGHT TOTAL KNEE ARTHROPLASTY;  Surgeon: [name], MD;  Location: [facility];  Service: Orthopedics;  Laterality: Right;  . TOTAL KNEE ARTHROPLASTY Left 07/06/2015   Procedure: LEFT TOTAL KNEE ARTHROPLASTY;  Surgeon: Gaynelle Arabian, MD;  Location: [facility];  Service: Orthopedics;  Laterality: Left;    There were no vitals filed for this visit.  Subjective Assessment - 08/28/17 1538    Subjective  Been cleaning around the house and in the yard.  Have  done my exercises with my husband's help.    Pertinent History  L TKR 06/2015, R TKR 2016    Patient Stated Goals  Pt's goals for therapy are to not fall anymore.    Currently in Pain?  No/denies                       [facility] Adult PT Treatment/Exercise - 08/28/17 0001      Self-Care   Self-Care  Other Self-Care Comments    Other Self-Care Comments   Discussed pt's R knee staying bent during stance phase of gait-with cues she is able to achieve more full knee extension on RLE, but she "feels off balance".  Assessed leg length in supine, with both legs 77 cm from greater trochaner to medial malleolus.  Began to address through seated hamstring stretches, 3 reps, 30 seconds propped on 4" block.          Balance Exercises - 08/28/17 2040      Balance Exercises: Standing   SLS  Eyes open;3 reps;Intermittent upper extremity support;Solid surface;10 secs Review of HEP    Rockerboard  Anterior/posterior;EO;10 reps hip/ ankle strategy, alt UE swing, stand w/o UE  support    Balance Beam  Standing on blue foam beam, marching in place x reps, then alternating forward step taps x 10 reps, cues for increased step length and foot clearance.  On balance beam, alternating UE lifts (keeping 1 UE on parallel bars for support), head turns and head nods x 5 reps with feet apart on beam, with intermittent UE support.    Tandem Gait  Forward;Intermittent upper extremity support;3 reps along counter; review of HEP-cues for UE support    Retro Gait  Upper extremity support;5 reps foward/back in parallel bars; cues for R terminal knee ext    Marching Limitations  Review of HEP from last visit, 2 sets x 10; cues to widen BOS and to stand beside counter (vs facing counter)    Heel Raises Limitations  Walking on toes, along counter x 3 reps-review of HEP      With review of exercises from previous visit, pt needs cues for correct technique, and to hold for UE support for steadiness.    PT Short  Term Goals - 08/17/17 1535      PT SHORT TERM GOAL #1   Title  Pt will perform HEP with husband's supervision for improved functional mobility, balance, gait.  TARGET 09/15/17    Time  4    Period  Weeks    Status  New    Target Date  09/15/17      PT SHORT TERM GOAL #2   Title  Pt will improve 5x sit<>stand in less than or equal to 15 seconds to demonstrate improved functional strength.    Time  4    Period  Weeks    Status  New    Target Date  09/15/17      PT SHORT TERM GOAL #3   Title  Pt will improve TUG score to less than or equal to 13.5 seconds for decreased fall risk.    Time  4    Period  Weeks    Status  New    Target Date  09/15/17      PT SHORT TERM GOAL #4   Title  Pt/husband will report 50% improvement in bed mobility/car transfer tasks for improved functional mobility.    Time  4    Period  Weeks    Status  New    Target Date  09/15/17        PT Long Term Goals - 08/17/17 1537      PT LONG TERM GOAL #1   Title  Pt will verbalize understanding of fall prevention in home environment.  TARGET 11/15/17 (due to pt being out of town for >1 month during [place])    Time  8    Period  Weeks    Status  New    Target Date  11/15/17      PT LONG TERM GOAL #2   Title  Pt will perform 5x sit<>stand in less than or equal to 13 seconds for improved functional lower extremity strength.    Time  8    Period  Weeks    Status  New    Target Date  11/15/17      PT LONG TERM GOAL #3   Title  Pt will improve Dynamic Gait index score to at least 15/24 for decreased fall risk.    Time  8    Status  New    Target Date  11/15/17      PT LONG TERM GOAL #  4   Title  Pt will improve gait velocity to at least 2.62 ft/sec for improved gait efficiency and safety.    Time  8    Period  Weeks    Status  New    Target Date  11/15/17            Plan - 08/28/17 2049    Clinical Impression Statement  Continued to work on compliant surface and dynamic balance activities.  On  rockerboard, when pt experiences posterior loss of balance, she is unable to use hip/ankle strategy to regain, instead she reaches for parallel bars with UEs or requires therapist assist to regain balance.  Will conitnue to benefit from skilled PT to address balance and gait.    Rehab Potential  Good    Clinical Impairments Affecting Rehab Potential  memory loss/supportive spouse; has had therapy in the past    PT Frequency  2x / week    PT Duration  8 weeks    PT Treatment/Interventions  ADLs/Self Care Home Management;DME Instruction;Balance training;Therapeutic exercise;Therapeutic activities;Functional mobility training;Gait training;Neuromuscular re-education;Patient/family education    PT Next Visit Plan  Hip/ankle strategy work; consider added seate hamstring stretch and sit<>stand repetitions to HEP; continue balance on compliant surfaces    Consulted and Agree with Plan of Care  Patient       Patient will benefit from skilled therapeutic intervention in order to improve the following deficits and impairments:  Abnormal gait, Decreased balance, Decreased mobility, Difficulty walking, Decreased strength, Postural dysfunction  Visit Diagnosis: Unsteadiness on feet  Other abnormalities of gait and mobility     Problem List Patient Active Problem List   Diagnosis Date Noted  . Gait abnormality 08/15/2017  . Memory loss 08/15/2017  . Chronic low back pain 07/01/2016  . Abnormality of gait 05/12/2016  . Paresthesia 05/12/2016  . OA (osteoarthritis) of knee 07/07/2014    Shep Porter W. 08/28/2017, 8:54 PM Frazier Butt., PT  [facility] [address] [place], [place], [address] Phone: [phone]   Fax:  [phone]  Name: [name] MRN: [id number] Date of Birth: 09-08-1945

## 2017-08-29 ENCOUNTER — Encounter: Payer: Self-pay | Admitting: Physical Therapy

## 2017-08-29 ENCOUNTER — Ambulatory Visit: Payer: Medicare Other | Admitting: Physical Therapy

## 2017-08-29 DIAGNOSIS — R2689 Other abnormalities of gait and mobility: Secondary | ICD-10-CM

## 2017-08-29 DIAGNOSIS — M6281 Muscle weakness (generalized): Secondary | ICD-10-CM

## 2017-08-29 DIAGNOSIS — R2681 Unsteadiness on feet: Secondary | ICD-10-CM | POA: Diagnosis not present

## 2017-08-29 NOTE — Therapy (Signed)
[facility] [address] [place], [place], [address] Phone: [phone]   Fax:  [phone]  Physical Therapy Treatment  Patient Details  Name: [name] MRN: [id number] Date of Birth: August 19, 1945 Referring Provider: Marcial Pacas   Encounter Date: 08/29/2017  PT End of Session - 08/29/17 1637    Visit Number  5    Number of Visits  17    Date for PT Re-Evaluation  11/15/17    Authorization Type  Medicare and Federal BCBS    PT Start Time  1403    PT Stop Time  1446    PT Time Calculation (min)  43 min    Activity Tolerance  Patient tolerated treatment well    Behavior During Therapy  [facility] for tasks assessed/performed       Past Medical History:  Diagnosis Date  . Anxiety   . Arthritis   . Asthma    as a child  . Bipolar 1 disorder ([place])   . Cancer ([place])    hx breast cancer  . Depression   . Diabetes mellitus without complication ([place])    type 2  . Foot drop   . History of breast cancer 1995   lumpectomy and radiation left breast  . History of duodenal ulcer 1989  . Hyperlipidemia   . Hypertension   . Stress incontinence     Past Surgical History:  Procedure Laterality Date  . BREAST LUMPECTOMY  1995   left breast / radiation  . DILATION AND CURETTAGE OF UTERUS  1989  . JOINT REPLACEMENT    . KNEE ARTHROSCOPY     rt knee  . TONSILLECTOMY    . TOTAL KNEE ARTHROPLASTY Right 07/07/2014   Procedure: RIGHT TOTAL KNEE ARTHROPLASTY;  Surgeon: [name], MD;  Location: [facility];  Service: Orthopedics;  Laterality: Right;  . TOTAL KNEE ARTHROPLASTY Left 07/06/2015   Procedure: LEFT TOTAL KNEE ARTHROPLASTY;  Surgeon: Gaynelle Arabian, MD;  Location: [facility];  Service: Orthopedics;  Laterality: Left;    There were no vitals filed for this visit.  Subjective Assessment - 08/29/17 1405    Subjective  No changes since yesterday.  Did my exercises.    Pertinent History  L TKR 06/2015, R TKR 2016    Patient Stated Goals  Pt's goals for therapy are to not fall anymore.    Currently in Pain?  No/denies                       [facility] Adult PT Treatment/Exercise - 08/29/17 0001      Transfers   Transfers  Sit to Stand;Stand to Sit    Sit to Stand  6: Modified independent (Device/Increase time);Without upper extremity assist;From chair/3-in-1    Stand to Sit  6: Modified independent (Device/Increase time);Without upper extremity assist;To chair/3-in-1    Transfer Cueing  Pt requires cues for scoot to edge of mat, for increased forward lean, upright stand.    Comments  Functional strengthening activities with sit <>stand, especially with cues for controlled descent.  Practiced simulation of car transfer at low 16" mat, several reps with cues for optimal technique.  Then, at end of session, practiced car transfer at pt's mini-van, with PT providing cues for pt and husband for optimal technique.      Exercises   Exercises  Knee/Hip      Knee/Hip Exercises: Stretches   Active Hamstring Stretch  Right;Left;3 reps;30 seconds foot propped at  4" block    Active Hamstring Stretch Limitations  Needs cues for technique; attempted stretch with foot propped on stool, 30 sec 1 rep each leg      Knee/Hip Exercises: Standing   Functional Squat  10 reps    Functional Squat Limitations  Practiced simulation of squatting to pick up shells, as pt would be doing at the beach in [place] (as she leaves to go on her extended trip next week), cues for widened BOS and come upright to full upright posture before walking to next "shell" in order to have better balance, avoid excess forward lean.      Knee/Hip Exercises: Seated   Sit to Sand  2 sets;5 reps;with UE support;without UE support from 18" chair, cues for technique, for funct strengthening     Additional sit<>stand reps performed throughout session, with PT needing to provide cues for technique and posture.       Self Care:  Included  husband in education at end of session PT Education - 08/29/17 1637    Education provided  Yes    Education Details  Added sit<>stand and hamstring stretch to HEP; car transfers, transfer technique, safety with squatting to pick up objects, importance of posture for balance    Person(s) Educated  Patient;Spouse    Methods  Explanation;Demonstration;Handout    Comprehension  Verbalized understanding;Returned demonstration;Verbal cues required       PT Short Term Goals - 08/29/17 1640      PT SHORT TERM GOAL #1   Title  Pt will perform HEP with husband's supervision for improved functional mobility, balance, gait.  TARGET 09/15/17    Time  4    Period  Weeks    Status  On-going      PT SHORT TERM GOAL #2   Title  Pt will improve 5x sit<>stand in less than or equal to 15 seconds to demonstrate improved functional strength.    Time  4    Period  Weeks    Status  On-going      PT SHORT TERM GOAL #3   Title  Pt will improve TUG score to less than or equal to 13.5 seconds for decreased fall risk.    Time  4    Period  Weeks    Status  On-going      PT SHORT TERM GOAL #4   Title  Pt/husband will report 50% improvement in bed mobility/car transfer tasks for improved functional mobility.    Time  4    Period  Weeks    Status  On-going        PT Long Term Goals - 08/17/17 1537      PT LONG TERM GOAL #1   Title  Pt will verbalize understanding of fall prevention in home environment.  TARGET 11/15/17 (due to pt being out of town for >1 month during [place])    Time  8    Period  Weeks    Status  New    Target Date  11/15/17      PT LONG TERM GOAL #2   Title  Pt will perform 5x sit<>stand in less than or equal to 13 seconds for improved functional lower extremity strength.    Time  8    Period  Weeks    Status  New    Target Date  11/15/17      PT LONG TERM GOAL #3   Title  Pt will improve Dynamic Gait index score to  at least 15/24 for decreased fall risk.    Time  8    Status   New    Target Date  11/15/17      PT LONG TERM GOAL #4   Title  Pt will improve gait velocity to at least 2.62 ft/sec for improved gait efficiency and safety.    Time  8    Period  Weeks    Status  New    Target Date  11/15/17            Plan - 08/29/17 1638    Clinical Impression Statement  Worked on functional activities for patient today, as she is leaving next week for extended trip to [place] and will return to PT upon her return in June.  With focus on updates to HEP and education in functional tasks to both patient and husband, PT did not assess STGs, however, transfers, HEP, and car transfer were all addressed.  Will assess goals upon pt's return and update plan as needed.    Rehab Potential  Good    Clinical Impairments Affecting Rehab Potential  memory loss/supportive spouse; has had therapy in the past    PT Frequency  2x / week    PT Duration  8 weeks    PT Treatment/Interventions  ADLs/Self Care Home Management;DME Instruction;Balance training;Therapeutic exercise;Therapeutic activities;Functional mobility training;Gait training;Neuromuscular re-education;Patient/family education    PT Next Visit Plan  Pt to be out of town in [place] until June; plans to return to PT; will need to check STGs/LTGs and renew/update POC as needed.    Consulted and Agree with Plan of Care  Patient       Patient will benefit from skilled therapeutic intervention in order to improve the following deficits and impairments:  Abnormal gait, Decreased balance, Decreased mobility, Difficulty walking, Decreased strength, Postural dysfunction  Visit Diagnosis: Muscle weakness (generalized)  Other abnormalities of gait and mobility     Problem List Patient Active Problem List   Diagnosis Date Noted  . Gait abnormality 08/15/2017  . Memory loss 08/15/2017  . Chronic low back pain 07/01/2016  . Abnormality of gait 05/12/2016  . Paresthesia 05/12/2016  . OA (osteoarthritis) of knee  07/07/2014    Abeer Iversen W. 08/29/2017, 4:43 PM  Frazier Butt., PT   [facility] [address] [place], [place], [address] Phone: [phone]   Fax:  [phone]  Name: [name] MRN: [id number] Date of Birth: 1946/01/18

## 2017-08-29 NOTE — Patient Instructions (Addendum)
HIP: Hamstrings - Short Sitting    Rest leg on raised surface. Keep knee straight. Lift chest. Hold __30_ seconds. _3__ reps per set, _1-2__ sets per day  Copyright  VHI. All rights reserved.  Functional Quadriceps: Sit to Stand    Sit on edge of chair, feet flat on floor. Lean forward with momentum to stand upright, extending knees fully with your best upright posture.  Then slowly sit back down. Repeat _10___ times per set. Do __1-2__ sessions per day.  http://orth.exer.us/735   Copyright  VHI. All rights reserved.

## 2017-10-25 ENCOUNTER — Ambulatory Visit: Payer: Medicare Other | Attending: Neurology | Admitting: Physical Therapy

## 2017-10-25 DIAGNOSIS — M6281 Muscle weakness (generalized): Secondary | ICD-10-CM | POA: Insufficient documentation

## 2017-10-25 DIAGNOSIS — R2689 Other abnormalities of gait and mobility: Secondary | ICD-10-CM | POA: Diagnosis not present

## 2017-10-25 DIAGNOSIS — R2681 Unsteadiness on feet: Secondary | ICD-10-CM | POA: Insufficient documentation

## 2017-10-25 NOTE — Patient Instructions (Signed)
HIP: Hamstrings - Short Sitting    Rest leg on raised surface OR ON THE FLOOR.   Keep knee straight. Stay tall, lift chest and lean forward until you feel a gentle stretch. Hold __30_ seconds. _3__ reps per set, _1-2__ sets per day  Copyright  VHI. All rights reserved.  Functional Quadriceps: Sit to Stand    Sit on edge of chair, feet flat on floor. Lean forward with momentum to stand upright, extending knees fully with your best upright posture.  Then slowly sit back down. Repeat _10___ times per set. Do __1-2__ sessions per day.

## 2017-10-26 ENCOUNTER — Encounter: Payer: Self-pay | Admitting: Physical Therapy

## 2017-10-26 NOTE — Therapy (Signed)
[facility] [address] [place], [place], [address] Phone: [phone]   Fax:  [phone]  Physical Therapy Treatment  Patient Details  Name: [name] MRN: [id number] Date of Birth: 1946-01-11 Referring Provider: Marcial Pacas   Encounter Date: 10/25/2017  PT End of Session - 10/26/17 2116    Visit Number  6    Number of Visits  22 per recert 6/38/45    Date for PT Re-Evaluation  01/23/18    Authorization Type  Medicare and Federal BCBS    Authorization Time Period  Medicare cert:  3/64/68-0/32/12    PT Start Time  1108    PT Stop Time  1148    PT Time Calculation (min)  40 min    Activity Tolerance  Patient tolerated treatment well    Behavior During Therapy  [facility] for tasks assessed/performed       Past Medical History:  Diagnosis Date  . Anxiety   . Arthritis   . Asthma    as a child  . Bipolar 1 disorder ([place])   . Cancer ([place])    hx breast cancer  . Depression   . Diabetes mellitus without complication ([place])    type 2  . Foot drop   . History of breast cancer 1995   lumpectomy and radiation left breast  . History of duodenal ulcer 1989  . Hyperlipidemia   . Hypertension   . Stress incontinence     Past Surgical History:  Procedure Laterality Date  . BREAST LUMPECTOMY  1995   left breast / radiation  . DILATION AND CURETTAGE OF UTERUS  1989  . JOINT REPLACEMENT    . KNEE ARTHROSCOPY     rt knee  . TONSILLECTOMY    . TOTAL KNEE ARTHROPLASTY Right 07/07/2014   Procedure: RIGHT TOTAL KNEE ARTHROPLASTY;  Surgeon: [name], MD;  Location: [facility];  Service: Orthopedics;  Laterality: Right;  . TOTAL KNEE ARTHROPLASTY Left 07/06/2015   Procedure: LEFT TOTAL KNEE ARTHROPLASTY;  Surgeon: Gaynelle Arabian, MD;  Location: [facility];  Service: Orthopedics;  Laterality: Left;    There were no vitals filed for this visit.  Subjective Assessment - 10/26/17 2118    Subjective  While in [place], fell  twice out of the bed.  Had to skooch into the bathroom and pulled up from counter.  Had two falls coming up to house from beach, fell forward trying to reach for palm tree; other fall getting up the steps and fell on the deck.   Both were trying to hurry to bathroom; maybe it was low blood sugar.  No further pain or problems.      Pertinent History  L TKR 06/2015, R TKR 2016    Patient Stated Goals  Pt's goals for therapy are to not fall anymore.    Currently in Pain?  No/denies         [facility] PT Assessment - 10/26/17 1656      Transfers   Transfers  Sit to Stand;Stand to Sit    Sit to Stand  6: Modified independent (Device/Increase time);Without upper extremity assist;From chair/3-in-1    Five time sit to stand comments   17.78    Stand to Sit  6: Modified independent (Device/Increase time);Without upper extremity assist;To chair/3-in-1      Ambulation/Gait   Ambulation/Gait  Yes    Ambulation/Gait Assistance  5: Supervision    Ambulation Distance (Feet)  80 Feet x 2, 120 ft  x 2 no device; 120 ft with cane    Assistive device  None;Straight cane Tried straight cane    Gait Pattern  -- Tremor in RUE with gait, forward/lurch type pattern    Ambulation Surface  Level;Indoor    Gait Comments  Gait training attempted with use of cane.  Pt requires hand over hand assistance for cane sequence and to avoid hitting cane with foot.        Dynamic Gait Index   Level Surface  Moderate Impairment    Change in Gait Speed  Moderate Impairment    Gait with Horizontal Head Turns  Moderate Impairment    Gait with Vertical Head Turns  Moderate Impairment    Gait and Pivot Turn  Mild Impairment    Step Over Obstacle  Moderate Impairment    Step Around Obstacles  Moderate Impairment    Steps  Mild Impairment    Total Score  10      Timed Up and Go Test   Normal TUG (seconds)  19.63    TUG Comments  Scores >13.5 sec indicates increased fall risk                   [facility] Adult PT  Treatment/Exercise - 10/26/17 1656      Self-Care   Self-Care  Other Self-Care Comments    Other Self-Care Comments   Discussed pt's goals-mobility measures similar to but slower than previous measures prior to trip to [facility].  Discussed mechanisms of falls, including discussing option for cane use due to fall risk. Pt tries cane today but does not feel she needs cane.             PT Education - 10/26/17 2115    Education provided  Yes    Education Details  goals assessed as pt has returned from [facility] trip; discussed POC including updating goals; discussed continued fall risk and possibility of using cane-pt not interested in cane at this time.    Person(s) Educated  Patient    Methods  Explanation;Demonstration    Comprehension  Verbalized understanding       PT Short Term Goals - 10/25/17 1140      PT SHORT TERM GOAL #1   Title  Pt will perform HEP with husband's supervision for improved functional mobility, balance, gait.  TARGET 09/15/17    Time  4    Period  Weeks    Status  Achieved      PT SHORT TERM GOAL #2   Title  Pt will improve 5x sit<>stand in less than or equal to 15 seconds to demonstrate improved functional strength.  GOAL ONGOING, TARGET 11/24/17    Time  4    Period  Weeks    Status  Not Met Ongoing    Target Date  11/24/17      PT SHORT TERM GOAL #3   Title  Pt will improve TUG score to less than or equal to 13.5 seconds for decreased fall risk.  GOAL ONGOING, TARGET 11/24/17    Time  4    Period  Weeks    Status  Not Met Ongoing    Target Date  11/24/17      PT SHORT TERM GOAL #4   Title  Pt/husband will report 50% improvement in bed mobility/car transfer tasks for improved functional mobility.    Baseline  per patient report    Time  4    Period  Weeks  Status  Achieved        PT Long Term Goals - 10/26/17 2113      PT LONG TERM GOAL #1   Title  Pt will verbalize understanding of fall prevention in home environment.  TARGET 12/21/17    Time  8     Period  Weeks    Status  On-going    Target Date  12/21/17      PT LONG TERM GOAL #2   Title  Pt will perform 5x sit<>stand in less than or equal to 13 seconds for improved functional lower extremity strength.    Time  8    Period  Weeks    Status  On-going    Target Date  12/21/17      PT LONG TERM GOAL #3   Title  Pt will improve Dynamic Gait index score to at least 15/24 for decreased fall risk.    Time  8    Status  On-going    Target Date  12/21/17      PT LONG TERM GOAL #4   Title  Pt will improve gait velocity to at least 2.62 ft/sec for improved gait efficiency and safety.    Time  8    Period  Weeks    Status  On-going    Target Date  12/21/17            Plan - 10/26/17 2120    Clinical Impression Statement  Pt has been out of town in [place] since visit on 08/29/17, with pt returning to PT today.  Reassessment completed and STGs checked (not checked prior to pt's leaving for [place]).  STG 1 met for HEP; STG 2, 3 not met for 5x sit<>stand or TUG (as they are slightly slower than eval measures).  STG 4 met per patient report for improved bed mobility and car transfers.  (Husband not present for visit today to report on her performance).  However, pt does report having had 4 falls while in [place], two off the edge of bed, and 2 while walking hurredly from outside to inside house to use bathroom.  Pt will continue to benefit from skilled PT to address balance, gait and functional mobility for decreased fall risk and improved funcitonal moiblity.  Please see updated goals. (STG 2, 3, remain) and LTGs remain.    Rehab Potential  Good    Clinical Impairments Affecting Rehab Potential  memory loss/supportive spouse; has had therapy in the past    PT Frequency  2x / week    PT Duration  8 weeks per recert 4/97/02    PT Treatment/Interventions  ADLs/Self Care Home Management;DME Instruction;Balance training;Therapeutic exercise;Therapeutic activities;Functional mobility  training;Gait training;Neuromuscular re-education;Patient/family education    PT Next Visit Plan  Work on standing exercises to promote upright posture, step length and weightshifting with gait; try cane again with gait.    Consulted and Agree with Plan of Care  Patient       Patient will benefit from skilled therapeutic intervention in order to improve the following deficits and impairments:  Abnormal gait, Decreased balance, Decreased mobility, Difficulty walking, Decreased strength, Postural dysfunction  Visit Diagnosis: Other abnormalities of gait and mobility  Unsteadiness on feet  Muscle weakness (generalized)     Problem List Patient Active Problem List   Diagnosis Date Noted  . Gait abnormality 08/15/2017  . Memory loss 08/15/2017  . Chronic low back pain 07/01/2016  . Abnormality of gait 05/12/2016  . Paresthesia 05/12/2016  .  OA (osteoarthritis) of knee 07/07/2014    MARRIOTT,AMY W. 10/26/2017, 9:28 PM  Frazier Butt., PT   [facility] [address] [place], [place], [address] Phone: [phone]   Fax:  [phone]  Name: [name] MRN: [id number] Date of Birth: [birth year]

## 2017-10-27 ENCOUNTER — Encounter: Payer: Self-pay | Admitting: Physical Therapy

## 2017-10-27 ENCOUNTER — Ambulatory Visit: Payer: Medicare Other | Admitting: Physical Therapy

## 2017-10-27 DIAGNOSIS — M6281 Muscle weakness (generalized): Secondary | ICD-10-CM | POA: Diagnosis not present

## 2017-10-27 DIAGNOSIS — R2681 Unsteadiness on feet: Secondary | ICD-10-CM

## 2017-10-27 DIAGNOSIS — R2689 Other abnormalities of gait and mobility: Secondary | ICD-10-CM | POA: Diagnosis not present

## 2017-10-27 NOTE — Therapy (Signed)
[facility] [address] [place], [place], [address] Phone: [phone]   Fax:  [phone]  Physical Therapy Treatment  Patient Details  Name: [name] MRN: [id number] Date of Birth: [birth year] Referring Provider: Marcial Pacas   Encounter Date: 10/27/2017  PT End of Session - 10/27/17 1959    Visit Number  7    Number of Visits  22 per recert 08/07/00    Date for PT Re-Evaluation  01/23/18    Authorization Type  Medicare and Federal BCBS    Authorization Time Period  Medicare cert:  12/01/34-6/44/03    PT Start Time  1103    PT Stop Time  1145    PT Time Calculation (min)  42 min    Equipment Utilized During Treatment  Gait belt    Activity Tolerance  Patient tolerated treatment well    Behavior During Therapy  WFL for tasks assessed/performed       Past Medical History:  Diagnosis Date  . Anxiety   . Arthritis   . Asthma    as a child  . Bipolar 1 disorder ([place])   . Cancer ([place])    hx breast cancer  . Depression   . Diabetes mellitus without complication ([place])    type 2  . Foot drop   . History of breast cancer 1995   lumpectomy and radiation left breast  . History of duodenal ulcer 1989  . Hyperlipidemia   . Hypertension   . Stress incontinence     Past Surgical History:  Procedure Laterality Date  . BREAST LUMPECTOMY  1995   left breast / radiation  . DILATION AND CURETTAGE OF UTERUS  1989  . JOINT REPLACEMENT    . KNEE ARTHROSCOPY     rt knee  . TONSILLECTOMY    . TOTAL KNEE ARTHROPLASTY Right 07/07/2014   Procedure: RIGHT TOTAL KNEE ARTHROPLASTY;  Surgeon: [name], MD;  Location: [facility];  Service: Orthopedics;  Laterality: Right;  . TOTAL KNEE ARTHROPLASTY Left 07/06/2015   Procedure: LEFT TOTAL KNEE ARTHROPLASTY;  Surgeon: Gaynelle Arabian, MD;  Location: [facility];  Service: Orthopedics;  Laterality: Left;    There were no vitals filed for this visit.  Subjective Assessment -  10/27/17 1105    Subjective  No falls since last visit.    Pertinent History  L TKR 06/2015, R TKR 2016    Patient Stated Goals  Pt's goals for therapy are to not fall anymore.    Currently in Pain?  No/denies                       [facility] Adult PT Treatment/Exercise - 10/27/17 0001      Ambulation/Gait   Ambulation/Gait  Yes    Ambulation/Gait Assistance  5: Supervision;4: Min guard    Ambulation Distance (Feet)  120 Feet 240 ft, then 120 ft; 60 ft x 2    Assistive device  None with trial of R foot-up brace    Gait Pattern  Step-through pattern;Decreased arm swing - left;Decreased step length - right;Decreased stance time - right;Right flexed knee in stance;Decreased trunk rotation;Poor foot clearance - right    Ambulation Surface  Level;Indoor    Pre-Gait Activities  At counter:  worked on forward step and weightshift, for anterior excursion of pelvis for improved terminal knee extension in stance phase of gait, at least 10 reps each side, frequent cues for technique.  Gait Comments  Gait in parallel bars, using mirror for visual cues for attempting to improve RLE knee extension in stance phase for improved stability with gait-pt has difficulty maintaining this sequence.      High Level Balance   High Level Balance Comments  Forward walking along counter, 4 reps, includng turns to work on improved step length and heelstrike      Knee/Hip Exercises: Stretches   Other Knee/Hip Stretches  Standing hamstring stretch rocking forward and back at counter, 10 reps, 5 second hold      Knee/Hip Exercises: Standing   Functional Squat  10 reps Cues for quad activation    Functional Squat Limitations  similar to PWR! UP posture at counter    Other Standing Knee Exercises  Heel raises x 10 reps     Other Standing Knee Exercises  Step back and weightshift x 5 reps each leg, incorporating gastroc stretch, at counter                PT Short Term Goals - 10/25/17 1140       PT SHORT TERM GOAL #1   Title  Pt will perform HEP with husband's supervision for improved functional mobility, balance, gait.  TARGET 09/15/17    Time  4    Period  Weeks    Status  Achieved      PT SHORT TERM GOAL #2   Title  Pt will improve 5x sit<>stand in less than or equal to 15 seconds to demonstrate improved functional strength.  GOAL ONGOING, TARGET 11/24/17    Time  4    Period  Weeks    Status  Not Met Ongoing    Target Date  11/24/17      PT SHORT TERM GOAL #3   Title  Pt will improve TUG score to less than or equal to 13.5 seconds for decreased fall risk.  GOAL ONGOING, TARGET 11/24/17    Time  4    Period  Weeks    Status  Not Met Ongoing    Target Date  11/24/17      PT SHORT TERM GOAL #4   Title  Pt/husband will report 50% improvement in bed mobility/car transfer tasks for improved functional mobility.    Baseline  per patient report    Time  4    Period  Weeks    Status  Achieved      PT SHORT TERM GOAL #5   Title  Pt will ambulate outdoor surfaces, 300-500 ft, with supervision, with least restrictive assistive device for improved community mobility.    Time  4    Period  Weeks    Status  New    Target Date  11/24/17        PT Long Term Goals - 10/26/17 2113      PT LONG TERM GOAL #1   Title  Pt will verbalize understanding of fall prevention in home environment.  TARGET 12/21/17    Time  8    Period  Weeks    Status  On-going    Target Date  12/21/17      PT LONG TERM GOAL #2   Title  Pt will perform 5x sit<>stand in less than or equal to 13 seconds for improved functional lower extremity strength.    Time  8    Period  Weeks    Status  On-going    Target Date  12/21/17      PT LONG  TERM GOAL #3   Title  Pt will improve Dynamic Gait index score to at least 15/24 for decreased fall risk.    Time  8    Status  On-going    Target Date  12/21/17      PT LONG TERM GOAL #4   Title  Pt will improve gait velocity to at least 2.62 ft/sec for improved  gait efficiency and safety.    Time  8    Period  Weeks    Status  On-going    Target Date  12/21/17            Plan - 10/27/17 1959    Clinical Impression Statement  Skilled PT session this visit focused on pre-gait activities for weightshifting, stretching to work on terminal R knee extension in stance.  Gait training with trial use of foot-up brace on RLE today to help with timing of R foot clearance and heelstrike, but unsure of effectiveness as it keeps sliding down.  Pt has difficulty sequencing gait pattern with attempt at R knee extension in stance and cues for "equal, even" steps, as she tends to take too big of a step with RLE.  Pt will continue to benefit from skilled PT to address balance and gait to decrease fall risk.    Rehab Potential  Good    Clinical Impairments Affecting Rehab Potential  memory loss/supportive spouse; has had therapy in the past    PT Frequency  2x / week    PT Duration  8 weeks per recert 7/62/83    PT Treatment/Interventions  ADLs/Self Care Home Management;DME Instruction;Balance training;Therapeutic exercise;Therapeutic activities;Functional mobility training;Gait training;Neuromuscular re-education;Patient/family education    PT Next Visit Plan  Work on standing exercises to promote upright posture, step length and weightshifting with gait; try cane again with gait.    Consulted and Agree with Plan of Care  Patient       Patient will benefit from skilled therapeutic intervention in order to improve the following deficits and impairments:  Abnormal gait, Decreased balance, Decreased mobility, Difficulty walking, Decreased strength, Postural dysfunction  Visit Diagnosis: Unsteadiness on feet  Other abnormalities of gait and mobility     Problem List Patient Active Problem List   Diagnosis Date Noted  . Gait abnormality 08/15/2017  . Memory loss 08/15/2017  . Chronic low back pain 07/01/2016  . Abnormality of gait 05/12/2016  .  Paresthesia 05/12/2016  . OA (osteoarthritis) of knee 07/07/2014    Nethaniel Mattie W. 10/27/2017, 8:03 PM  Frazier Butt., PT   [facility] [address] [place], [place], [address] Phone: [phone]   Fax:  [phone]  Name: [name] MRN: [id number] Date of Birth: Jun 18, 1945

## 2017-10-30 ENCOUNTER — Ambulatory Visit: Payer: Medicare Other | Admitting: Physical Therapy

## 2017-10-30 DIAGNOSIS — R2681 Unsteadiness on feet: Secondary | ICD-10-CM

## 2017-10-30 DIAGNOSIS — R2689 Other abnormalities of gait and mobility: Secondary | ICD-10-CM

## 2017-10-30 DIAGNOSIS — M6281 Muscle weakness (generalized): Secondary | ICD-10-CM | POA: Diagnosis not present

## 2017-10-30 NOTE — Therapy (Signed)
[facility] [address] [place], [place], [address] Phone: [phone]   Fax:  [phone]  Physical Therapy Treatment  Patient Details  Name: [name] MRN: [id number] Date of Birth: 03/03/1946 Referring Provider: Marcial Pacas   Encounter Date: 10/30/2017  PT End of Session - 10/30/17 1509    Visit Number  8    Number of Visits  22 per recert 6/78/93    Date for PT Re-Evaluation  01/23/18    Authorization Type  Medicare and Federal BCBS    Authorization Time Period  Medicare cert:  12/17/15-09/16/23    PT Start Time  1233    PT Stop Time  1315    PT Time Calculation (min)  42 min    Equipment Utilized During Treatment  Gait belt    Activity Tolerance  Patient tolerated treatment well    Behavior During Therapy  WFL for tasks assessed/performed       Past Medical History:  Diagnosis Date  . Anxiety   . Arthritis   . Asthma    as a child  . Bipolar 1 disorder ([place])   . Cancer ([place])    hx breast cancer  . Depression   . Diabetes mellitus without complication ([place])    type 2  . Foot drop   . History of breast cancer 1995   lumpectomy and radiation left breast  . History of duodenal ulcer 1989  . Hyperlipidemia   . Hypertension   . Stress incontinence     Past Surgical History:  Procedure Laterality Date  . BREAST LUMPECTOMY  1995   left breast / radiation  . DILATION AND CURETTAGE OF UTERUS  1989  . JOINT REPLACEMENT    . KNEE ARTHROSCOPY     rt knee  . TONSILLECTOMY    . TOTAL KNEE ARTHROPLASTY Right 07/07/2014   Procedure: RIGHT TOTAL KNEE ARTHROPLASTY;  Surgeon: [name], MD;  Location: [facility];  Service: Orthopedics;  Laterality: Right;  . TOTAL KNEE ARTHROPLASTY Left 07/06/2015   Procedure: LEFT TOTAL KNEE ARTHROPLASTY;  Surgeon: Gaynelle Arabian, MD;  Location: [facility];  Service: Orthopedics;  Laterality: Left;    There were no vitals filed for this visit.  Subjective Assessment -  10/30/17 1235    Subjective  No falls since last visit.  Went to church without problems, but I did feel a little unsteady trying to get up to sing in choir.    Pertinent History  L TKR 06/2015, R TKR 2016    Patient Stated Goals  Pt's goals for therapy are to not fall anymore.    Currently in Pain?  No/denies                       [facility] Adult PT Treatment/Exercise - 10/30/17 0001      Ambulation/Gait   Ambulation/Gait  Yes    Ambulation/Gait Assistance  5: Supervision;4: Min guard    Ambulation/Gait Assistance Details  Tried speeding up/slowing down with cues for faster walking, and pt walks same speed, taking longer, more lurching-type strides.    Ambulation Distance (Feet)  120 Feet x 3, then 120, then 150 no device; cane x 120 ft    Assistive device  None;Straight cane    Gait Pattern  Step-through pattern;Decreased arm swing - left;Decreased step length - right;Decreased stance time - right;Right flexed knee in stance;Decreased trunk rotation;Poor foot clearance - right    Pre-Gait Activities  Cues  for upright posture     Gait Comments  Attempted gait training again with cane, with pt preferring to use cane in R hand; pt needs hand over hand assist at times for correct cane sequence.        High Level Balance   High Level Balance Comments  Forward step and weightshift x 10 reps each leg, then forward>back step and weightshift x 10 reps each leg with UE support      Knee/Hip Exercises: Standing   Functional Squat  10 reps    Functional Squat Limitations  similar to PWR! UP posture at counter    Other Standing Knee Exercises  Heel raises x 10 reps; stagger stance forward/back rocking 15 reps at counter, with tactile and verbal cues for weightshifting.    Other Standing Knee Exercises  STanding lateral weightshifting with widened BOS x 10 reps each leg; cues for upright posture and terminal knee extension for improved balance.      Forward/back walking at counter with UE  support, cues for equal step length, x 4 reps both directions.  Postural cues for upright posture forward walking, hinge at hips for backwards walking.  Combined heel/toe raises x 10 reps with UE support at counter, cues to avoid excess hip flexion.          PT Short Term Goals - 10/25/17 1140      PT SHORT TERM GOAL #1   Title  Pt will perform HEP with husband's supervision for improved functional mobility, balance, gait.  TARGET 09/15/17    Time  4    Period  Weeks    Status  Achieved      PT SHORT TERM GOAL #2   Title  Pt will improve 5x sit<>stand in less than or equal to 15 seconds to demonstrate improved functional strength.  GOAL ONGOING, TARGET 11/24/17    Time  4    Period  Weeks    Status  Not Met Ongoing    Target Date  11/24/17      PT SHORT TERM GOAL #3   Title  Pt will improve TUG score to less than or equal to 13.5 seconds for decreased fall risk.  GOAL ONGOING, TARGET 11/24/17    Time  4    Period  Weeks    Status  Not Met Ongoing    Target Date  11/24/17      PT SHORT TERM GOAL #4   Title  Pt/husband will report 50% improvement in bed mobility/car transfer tasks for improved functional mobility.    Baseline  per patient report    Time  4    Period  Weeks    Status  Achieved      PT SHORT TERM GOAL #5   Title  Pt will ambulate outdoor surfaces, 300-500 ft, with supervision, with least restrictive assistive device for improved community mobility.    Time  4    Period  Weeks    Status  New    Target Date  11/24/17        PT Long Term Goals - 10/26/17 2113      PT LONG TERM GOAL #1   Title  Pt will verbalize understanding of fall prevention in home environment.  TARGET 12/21/17    Time  8    Period  Weeks    Status  On-going    Target Date  12/21/17      PT LONG TERM GOAL #2   Title  Pt will perform 5x sit<>stand in less than or equal to 13 seconds for improved functional lower extremity strength.    Time  8    Period  Weeks    Status  On-going     Target Date  12/21/17      PT LONG TERM GOAL #3   Title  Pt will improve Dynamic Gait index score to at least 15/24 for decreased fall risk.    Time  8    Status  On-going    Target Date  12/21/17      PT LONG TERM GOAL #4   Title  Pt will improve gait velocity to at least 2.62 ft/sec for improved gait efficiency and safety.    Time  8    Period  Weeks    Status  On-going    Target Date  12/21/17            Plan - 10/30/17 1510    Clinical Impression Statement  Skilled PT session today focused again on pre-gait activities for weightshifting and activities to try to improve R terminal knee extension in stance.  Worked also on gait training with cane, with varied cues for upright posture and improved consistent rhythm with gait pattern.  However, pt continues with widened BOS and forward flexed posture, with increased time to bring through RLE with gait.  Requested patient bring in husband for next visit to work on updating HEP and discuss/further address gait training.    Rehab Potential  Good    Clinical Impairments Affecting Rehab Potential  memory loss/supportive spouse; has had therapy in the past    PT Frequency  2x / week    PT Duration  8 weeks per recert 9/75/30    PT Treatment/Interventions  ADLs/Self Care Home Management;DME Instruction;Balance training;Therapeutic exercise;Therapeutic activities;Functional mobility training;Gait training;Neuromuscular re-education;Patient/family education    PT Next Visit Plan  Work on standing exercises to promote upright posture, step length and weightshifting with gait; try cane again with gait; requested to patient to ask husband to be present for session for education in HEP and gait    Consulted and Agree with Plan of Care  Patient       Patient will benefit from skilled therapeutic intervention in order to improve the following deficits and impairments:  Abnormal gait, Decreased balance, Decreased mobility, Difficulty walking,  Decreased strength, Postural dysfunction  Visit Diagnosis: Unsteadiness on feet  Other abnormalities of gait and mobility     Problem List Patient Active Problem List   Diagnosis Date Noted  . Gait abnormality 08/15/2017  . Memory loss 08/15/2017  . Chronic low back pain 07/01/2016  . Abnormality of gait 05/12/2016  . Paresthesia 05/12/2016  . OA (osteoarthritis) of knee 07/07/2014    Cleo Santucci W. 10/30/2017, 3:14 PM  Frazier Butt., PT   [facility] [address] [place], [place], [address] Phone: [phone]   Fax:  [phone]  Name: HAROLDINE REDLER MRN: [id number] Date of Birth: 1945-11-08

## 2017-11-01 ENCOUNTER — Ambulatory Visit: Payer: Medicare Other | Admitting: Physical Therapy

## 2017-11-01 DIAGNOSIS — E538 Deficiency of other specified B group vitamins: Secondary | ICD-10-CM | POA: Diagnosis not present

## 2017-11-01 DIAGNOSIS — M1990 Unspecified osteoarthritis, unspecified site: Secondary | ICD-10-CM | POA: Diagnosis not present

## 2017-11-01 DIAGNOSIS — R2681 Unsteadiness on feet: Secondary | ICD-10-CM

## 2017-11-01 DIAGNOSIS — R2689 Other abnormalities of gait and mobility: Secondary | ICD-10-CM

## 2017-11-01 DIAGNOSIS — E1165 Type 2 diabetes mellitus with hyperglycemia: Secondary | ICD-10-CM | POA: Diagnosis not present

## 2017-11-01 DIAGNOSIS — E782 Mixed hyperlipidemia: Secondary | ICD-10-CM | POA: Diagnosis not present

## 2017-11-01 DIAGNOSIS — M6281 Muscle weakness (generalized): Secondary | ICD-10-CM | POA: Diagnosis not present

## 2017-11-01 NOTE — Patient Instructions (Addendum)
Single Step: Forward / Backward    Lifting foot off floor, take one step slowly forward with right leg. Then take one step backward, and repeat. Repeat __10__ times per session. Do __1-2__ sessions per day. Repeat with other leg.  Copyright  VHI. All rights reserved.  Backward    At counter, forwards and backwards walking with eyes open. Take even steps, making sure each foot lifts off floor.  When you are walking backwards, make sure to "hinge at your hips" and "lead with your buttocks" Repeat for _3-5___ reps along counter. Do __1-2__ sessions per day.   Copyright  VHI. All rights reserved.

## 2017-11-02 ENCOUNTER — Encounter: Payer: Self-pay | Admitting: Physical Therapy

## 2017-11-02 NOTE — Therapy (Signed)
[facility] [address] [place], [place], [address] Phone: [phone]   Fax:  [phone]  Physical Therapy Treatment  Patient Details  Name: [name] MRN: [id number] Date of Birth: 18-Aug-1945 Referring Provider: Marcial Pacas   Encounter Date: 11/01/2017  PT End of Session - 11/02/17 2219    Visit Number  9    Number of Visits  22 per recert 5/92/92    Date for PT Re-Evaluation  01/23/18    Authorization Type  Medicare and Federal BCBS    Authorization Time Period  Medicare cert:  4/46/28-6/38/17    PT Start Time  1109    PT Stop Time  1148    PT Time Calculation (min)  39 min    Equipment Utilized During Treatment  Gait belt    Activity Tolerance  Patient tolerated treatment well    Behavior During Therapy  WFL for tasks assessed/performed       Past Medical History:  Diagnosis Date  . Anxiety   . Arthritis   . Asthma    as a child  . Bipolar 1 disorder ([place])   . Cancer ([place])    hx breast cancer  . Depression   . Diabetes mellitus without complication ([place])    type 2  . Foot drop   . History of breast cancer 1995   lumpectomy and radiation left breast  . History of duodenal ulcer 1989  . Hyperlipidemia   . Hypertension   . Stress incontinence     Past Surgical History:  Procedure Laterality Date  . BREAST LUMPECTOMY  1995   left breast / radiation  . DILATION AND CURETTAGE OF UTERUS  1989  . JOINT REPLACEMENT    . KNEE ARTHROSCOPY     rt knee  . TONSILLECTOMY    . TOTAL KNEE ARTHROPLASTY Right 07/07/2014   Procedure: RIGHT TOTAL KNEE ARTHROPLASTY;  Surgeon: [name], MD;  Location: [facility];  Service: Orthopedics;  Laterality: Right;  . TOTAL KNEE ARTHROPLASTY Left 07/06/2015   Procedure: LEFT TOTAL KNEE ARTHROPLASTY;  Surgeon: Gaynelle Arabian, MD;  Location: [facility];  Service: Orthopedics;  Laterality: Left;    There were no vitals filed for this visit.  Subjective Assessment -  11/02/17 2205    Subjective  No changes since last visit.  Husband present, and reports the 2 falls in [place].  "It's like she just can't stop herself.  I have to physically stop her from falling."    Patient is accompained by:  Family member husband    Pertinent History  L TKR 06/2015, R TKR 2016    Patient Stated Goals  Pt's goals for therapy are to not fall anymore.    Currently in Pain?  No/denies                       [facility] Adult PT Treatment/Exercise - 11/02/17 0001      Ambulation/Gait   Ambulation/Gait  Yes    Ambulation/Gait Assistance  4: Min guard    Ambulation Distance (Feet)  120 Feet x 2, 80 ft x 2    Assistive device  None;Straight cane    Gait Pattern  Step-through pattern;Decreased arm swing - left;Decreased step length - right;Decreased stance time - right;Right flexed knee in stance;Decreased trunk rotation;Poor foot clearance - right;Trunk flexed    Ambulation Surface  Level;Indoor    Gait Comments  Cues for upright posture.  Attempted gait training with  single walking pole (adjusted to be height of cane), with pt needing hand over hand assistance to sequence.  Pt gets off sequence with cane, needing assist to get back into sequence.  Also, during gait, she is distracted by participating in conversations with other patients.  Tried gait x 120 ft with bilateral walking poles to facilitate arm swing, but pt has difficulty sequencing arm swing with gait.      High Level Balance   High Level Balance Activities  Backward walking Forward/back walking at counter, x 2 reps    High Level Balance Comments  Forward step and weightshift x 10 reps each leg, then forward>back step and weightshift x 10 reps each leg with UE support.  Performed at counter, with pt needing verbal, visual, and at times tactile cues.  Stagger stance forward/back rocking x 10-15 reps each leg, with cues provided for upright posture when shifting to forward leg and "hinged hip" posture when  shifting to back leg.        Self-Care   Self-Care  Other Self-Care Comments    Other Self-Care Comments   Discussed with husband present, pt's continued fall risk, and reviewed her falls that occurred in [place].  Discussed ways that he can help to cue her for upright posture, and pacing with gait, including verbal cues and tactile cues if needed to stop and reset posture.  Husband voices concern over pt's decreased overall walking stamina (used to walk at the park long distances, now she is unable).  He does report tremor in RUE with gait is not new.  Encouraged pt/wife to journal symptoms that may be changing or worsening in regards to balance and gait in order to fully discuss at next neurologist appoinment.  Discussed POC (pt at last appt this week), and discussed that pt would benefit from continued therapy consistently now that she has returned from [place] to address gait and balance.             PT Education - 11/02/17 2218    Education provided  Yes    Education Details  Added to HEP; discussed journaling symptoms with walking and balance (especially if changes since last neurologist appointment) in order to share information at upcoming neurologist visit    Person(s) Educated  Patient;Spouse    Methods  Explanation;Demonstration;Handout    Comprehension  Verbalized understanding       PT Short Term Goals - 10/25/17 1140      PT SHORT TERM GOAL #1   Title  Pt will perform HEP with husband's supervision for improved functional mobility, balance, gait.  TARGET 09/15/17    Time  4    Period  Weeks    Status  Achieved      PT SHORT TERM GOAL #2   Title  Pt will improve 5x sit<>stand in less than or equal to 15 seconds to demonstrate improved functional strength.  GOAL ONGOING, TARGET 11/24/17    Time  4    Period  Weeks    Status  Not Met Ongoing    Target Date  11/24/17      PT SHORT TERM GOAL #3   Title  Pt will improve TUG score to less than or equal to 13.5 seconds for  decreased fall risk.  GOAL ONGOING, TARGET 11/24/17    Time  4    Period  Weeks    Status  Not Met Ongoing    Target Date  11/24/17      PT  SHORT TERM GOAL #4   Title  Pt/husband will report 50% improvement in bed mobility/car transfer tasks for improved functional mobility.    Baseline  per patient report    Time  4    Period  Weeks    Status  Achieved      PT SHORT TERM GOAL #5   Title  Pt will ambulate outdoor surfaces, 300-500 ft, with supervision, with least restrictive assistive device for improved community mobility.    Time  4    Period  Weeks    Status  New    Target Date  11/24/17        PT Long Term Goals - 10/26/17 2113      PT LONG TERM GOAL #1   Title  Pt will verbalize understanding of fall prevention in home environment.  TARGET 12/21/17    Time  8    Period  Weeks    Status  On-going    Target Date  12/21/17      PT LONG TERM GOAL #2   Title  Pt will perform 5x sit<>stand in less than or equal to 13 seconds for improved functional lower extremity strength.    Time  8    Period  Weeks    Status  On-going    Target Date  12/21/17      PT LONG TERM GOAL #3   Title  Pt will improve Dynamic Gait index score to at least 15/24 for decreased fall risk.    Time  8    Status  On-going    Target Date  12/21/17      PT LONG TERM GOAL #4   Title  Pt will improve gait velocity to at least 2.62 ft/sec for improved gait efficiency and safety.    Time  8    Period  Weeks    Status  On-going    Target Date  12/21/17            Plan - 11/02/17 2220    Clinical Impression Statement  Added to HEP with standing exercises at counter today to address weightshifting and posterior step/weightshifting.  Pt continues to need max cues for proper technique, but husband present for session/education in HEP today.  Based on conversation with husband/patient, it is unclear if pt is having worsening symptoms with gait and balance (therapist notes RUE tremor, forward flexed  posture, discoordination/timing with gait) since last neurologist visit, and encouraged pt/husband to journal symptoms to discuss with Dr. Krista Blue at her next visit.    Rehab Potential  Good    Clinical Impairments Affecting Rehab Potential  memory loss/supportive spouse; has had therapy in the past    PT Frequency  2x / week    PT Duration  8 weeks per recert 9/32/67    PT Treatment/Interventions  ADLs/Self Care Home Management;DME Instruction;Balance training;Therapeutic exercise;Therapeutic activities;Functional mobility training;Gait training;Neuromuscular re-education;Patient/family education    PT Next Visit Plan  Review HEP given this visit; continue to work on standing balance exercises-stepping, weightshifting, gait activities, ?compliant surfaces    Consulted and Agree with Plan of Care  Patient      PLAN:  PROGRESS NOTE NEXT VISIT (10th VISIT) Patient will benefit from skilled therapeutic intervention in order to improve the following deficits and impairments:  Abnormal gait, Decreased balance, Decreased mobility, Difficulty walking, Decreased strength, Postural dysfunction  Visit Diagnosis: Unsteadiness on feet  Other abnormalities of gait and mobility     Problem List Patient Active  Problem List   Diagnosis Date Noted  . Gait abnormality 08/15/2017  . Memory loss 08/15/2017  . Chronic low back pain 07/01/2016  . Abnormality of gait 05/12/2016  . Paresthesia 05/12/2016  . OA (osteoarthritis) of knee 07/07/2014    MARRIOTT,AMY W. 11/02/2017, 10:24 PM Mady Haagensen, PT 11/02/17 10:25 PM Phone: [phone] Fax: [facility] [address] [place], [place], [address] Phone: [phone]   Fax:  [phone]  Name: [name] MRN: [id number] Date of Birth: 10/15/1945

## 2017-11-07 ENCOUNTER — Ambulatory Visit: Payer: Medicare Other | Attending: Neurology | Admitting: Physical Therapy

## 2017-11-07 ENCOUNTER — Encounter: Payer: Self-pay | Admitting: Physical Therapy

## 2017-11-07 DIAGNOSIS — R2689 Other abnormalities of gait and mobility: Secondary | ICD-10-CM | POA: Insufficient documentation

## 2017-11-07 DIAGNOSIS — R2681 Unsteadiness on feet: Secondary | ICD-10-CM | POA: Insufficient documentation

## 2017-11-07 DIAGNOSIS — M6281 Muscle weakness (generalized): Secondary | ICD-10-CM | POA: Insufficient documentation

## 2017-11-07 NOTE — Therapy (Signed)
[facility] [address] [place], [place], [address] Phone: [phone]   Fax:  [phone]  Physical Therapy Treatment/Progress Note  Patient Details  Name: [name] MRN: [id number] Date of Birth: 10-29-1945 Referring Provider: Marcial Pacas   Encounter Date: 11/07/2017  PT End of Session - 11/07/17 1748    Visit Number  10    Number of Visits  22 per recert 09/04/74    Date for PT Re-Evaluation  01/23/18    Authorization Type  Medicare and Federal BCBS    Authorization Time Period  Medicare cert:  12/18/55-2/62/03    PT Start Time  0850    PT Stop Time  0930    PT Time Calculation (min)  40 min    Equipment Utilized During Treatment  Gait belt    Activity Tolerance  Patient tolerated treatment well    Behavior During Therapy  [facility] for tasks assessed/performed       Past Medical History:  Diagnosis Date  . Anxiety   . Arthritis   . Asthma    as a child  . Bipolar 1 disorder ([place])   . Cancer ([place])    hx breast cancer  . Depression   . Diabetes mellitus without complication ([place])    type 2  . Foot drop   . History of breast cancer 1995   lumpectomy and radiation left breast  . History of duodenal ulcer 1989  . Hyperlipidemia   . Hypertension   . Stress incontinence     Past Surgical History:  Procedure Laterality Date  . BREAST LUMPECTOMY  1995   left breast / radiation  . DILATION AND CURETTAGE OF UTERUS  1989  . JOINT REPLACEMENT    . KNEE ARTHROSCOPY     rt knee  . TONSILLECTOMY    . TOTAL KNEE ARTHROPLASTY Right 07/07/2014   Procedure: RIGHT TOTAL KNEE ARTHROPLASTY;  Surgeon: [name], MD;  Location: [facility];  Service: Orthopedics;  Laterality: Right;  . TOTAL KNEE ARTHROPLASTY Left 07/06/2015   Procedure: LEFT TOTAL KNEE ARTHROPLASTY;  Surgeon: Gaynelle Arabian, MD;  Location: [facility];  Service: Orthopedics;  Laterality: Left;    There were no vitals filed for this visit.  Subjective  Assessment - 11/07/17 0853    Subjective  Carried laundry upstairs yesterday multiple times.  No falls.  Went walking outside for 15-20 minutes yesterday with my dog and my husband.  I was very anxious that I might fall, but I didn't.    Patient is accompained by:  --    Pertinent History  L TKR 06/2015, R TKR 2016    Patient Stated Goals  Pt's goals for therapy are to not fall anymore.    Currently in Pain?  No/denies                       [facility] Adult PT Treatment/Exercise - 11/07/17 0001      Ambulation/Gait   Ambulation/Gait  Yes    Ambulation/Gait Assistance  4: Min guard    Ambulation Distance (Feet)  240 Feet Additional 240 ft with resisted gait    Assistive device  None    Gait Pattern  Step-through pattern;Decreased arm swing - left;Decreased step length - right;Decreased stance time - right;Right flexed knee in stance;Decreased trunk rotation;Poor foot clearance - right;Trunk flexed Forward flexed posture     Ambulation Surface  Level;Indoor    Pre-Gait Activities  Occasional cues for upright  posture with gait.    Gait Comments  Resisted gait training to try to improve posture and work effort level with gait, x 240 ft, with PTA providing HHA and occasional cues for posture, while PT provides resistance to gait.     Short distance gait, 20 ft x 4 reps, with cues for upright posture, "heel to toe" gait pattern.     Balance Exercises - 11/07/17 0904      Balance Exercises: Standing   Wall Bumps  Hip    Wall Bumps-Hips  Eyes opened;Anterior/posterior;20 reps    Rockerboard  Anterior/posterior;Lateral;EO;10 reps;UE support A/P, 20 reps side, cues for robust step for balance    Heel Raises Limitations  20 reps    Toe Raise Limitations  20 reps    Other Standing Exercises  Review of HEP given last visit:  anterior/posterior step and weightshifting x 20 reps with UE support; forward/back walking along counter multiple reps with UE support.  Pt requires extra time and  cueing for all above exercises, but overall perofrms well today.          PT Short Term Goals - 10/25/17 1140      PT SHORT TERM GOAL #1   Title  Pt will perform HEP with husband's supervision for improved functional mobility, balance, gait.  TARGET 09/15/17    Time  4    Period  Weeks    Status  Achieved      PT SHORT TERM GOAL #2   Title  Pt will improve 5x sit<>stand in less than or equal to 15 seconds to demonstrate improved functional strength.  GOAL ONGOING, TARGET 11/24/17    Time  4    Period  Weeks    Status  Not Met Ongoing    Target Date  11/24/17      PT SHORT TERM GOAL #3   Title  Pt will improve TUG score to less than or equal to 13.5 seconds for decreased fall risk.  GOAL ONGOING, TARGET 11/24/17    Time  4    Period  Weeks    Status  Not Met Ongoing    Target Date  11/24/17      PT SHORT TERM GOAL #4   Title  Pt/husband will report 50% improvement in bed mobility/car transfer tasks for improved functional mobility.    Baseline  per patient report    Time  4    Period  Weeks    Status  Achieved      PT SHORT TERM GOAL #5   Title  Pt will ambulate outdoor surfaces, 300-500 ft, with supervision, with least restrictive assistive device for improved community mobility.    Time  4    Period  Weeks    Status  New    Target Date  11/24/17        PT Long Term Goals - 10/26/17 2113      PT LONG TERM GOAL #1   Title  Pt will verbalize understanding of fall prevention in home environment.  TARGET 12/21/17    Time  8    Period  Weeks    Status  On-going    Target Date  12/21/17      PT LONG TERM GOAL #2   Title  Pt will perform 5x sit<>stand in less than or equal to 13 seconds for improved functional lower extremity strength.    Time  8    Period  Weeks    Status  On-going  Target Date  12/21/17      PT LONG TERM GOAL #3   Title  Pt will improve Dynamic Gait index score to at least 15/24 for decreased fall risk.    Time  8    Status  On-going     Target Date  12/21/17      PT LONG TERM GOAL #4   Title  Pt will improve gait velocity to at least 2.62 ft/sec for improved gait efficiency and safety.    Time  8    Period  Weeks    Status  On-going    Target Date  12/21/17            Plan - 11/07/17 1750    Clinical Impression Statement  PT progress note covering 10 PT visits from initial eval 08/17/17 to 11/07/17.  Pt did not receive therapy between 08/29/17 to 10/25/17, due to a trip to [place].  Goals were assessed last week, with recert send to MD to further address balance and gait.  Pt has met STG 1 and 3 for HEP and bed mobility; pt is progressing towards remaining STGs.  Reviewed HEP from last week, with pt return demo understanding.  She continues to demonstrate slowed mobility patterns with gait and exercise, even with cues and (resistance with gait) to try to invoke increased posture, effort/amplitude of movement.  Pt will continue to beneift from skilled PT to address balance and gait and posture for improved functional mobility.    Rehab Potential  Good    Clinical Impairments Affecting Rehab Potential  memory loss/supportive spouse; has had therapy in the past    PT Frequency  2x / week    PT Duration  8 weeks per recert 7/54/36    PT Treatment/Interventions  ADLs/Self Care Home Management;DME Instruction;Balance training;Therapeutic exercise;Therapeutic activities;Functional mobility training;Gait training;Neuromuscular re-education;Patient/family education    PT Next Visit Plan  Continue to work on standing balance exercises-stepping, weightshifting, gait activities including resisted gait; compliant surfaces    Consulted and Agree with Plan of Care  Patient       Patient will benefit from skilled therapeutic intervention in order to improve the following deficits and impairments:  Abnormal gait, Decreased balance, Decreased mobility, Difficulty walking, Decreased strength, Postural dysfunction  Visit  Diagnosis: Unsteadiness on feet  Other abnormalities of gait and mobility     Problem List Patient Active Problem List   Diagnosis Date Noted  . Gait abnormality 08/15/2017  . Memory loss 08/15/2017  . Chronic low back pain 07/01/2016  . Abnormality of gait 05/12/2016  . Paresthesia 05/12/2016  . OA (osteoarthritis) of knee 07/07/2014    Kassadie Pancake W. 11/07/2017, 5:56 PM Frazier Butt., PT  [facility] [address] [place], [place], [address] Phone: [phone]   Fax:  [phone]  Name: DAVONNA ERTL MRN: [id number] Date of Birth: Jun 20, 1945

## 2017-11-08 ENCOUNTER — Ambulatory Visit: Payer: Medicare Other | Admitting: Physical Therapy

## 2017-11-08 ENCOUNTER — Encounter: Payer: Self-pay | Admitting: Physical Therapy

## 2017-11-08 DIAGNOSIS — M6281 Muscle weakness (generalized): Secondary | ICD-10-CM | POA: Diagnosis not present

## 2017-11-08 DIAGNOSIS — R2689 Other abnormalities of gait and mobility: Secondary | ICD-10-CM | POA: Diagnosis not present

## 2017-11-08 DIAGNOSIS — R2681 Unsteadiness on feet: Secondary | ICD-10-CM

## 2017-11-10 NOTE — Therapy (Signed)
[facility] [address] [place], [place], [address] Phone: [phone]   Fax:  [phone]  Physical Therapy Treatment  Patient Details  Name: [name] MRN: [id number] Date of Birth: [birth year] Referring Provider: Marcial Pacas   Encounter Date: 11/08/2017   11/08/17 1535  PT Visits / Re-Eval  Visit Number 11  Number of Visits 22 (per recert 07/29/53)  Date for PT Re-Evaluation 01/23/18  Authorization  Authorization Type Medicare and Federal BCBS  Authorization Time Period Medicare cert:  7/32/20-2/54/27  PT Time Calculation  PT Start Time 1534  PT Stop Time 1613  PT Time Calculation (min) 39 min  PT - End of Session  Equipment Utilized During Treatment Gait belt  Activity Tolerance Patient tolerated treatment well  Behavior During Therapy [facility] for tasks assessed/performed     Past Medical History:  Diagnosis Date  . Anxiety   . Arthritis   . Asthma    as a child  . Bipolar 1 disorder ([place])   . Cancer ([place])    hx breast cancer  . Depression   . Diabetes mellitus without complication ([place])    type 2  . Foot drop   . History of breast cancer 1995   lumpectomy and radiation left breast  . History of duodenal ulcer 1989  . Hyperlipidemia   . Hypertension   . Stress incontinence     Past Surgical History:  Procedure Laterality Date  . BREAST LUMPECTOMY  1995   left breast / radiation  . DILATION AND CURETTAGE OF UTERUS  1989  . JOINT REPLACEMENT    . KNEE ARTHROSCOPY     rt knee  . TONSILLECTOMY    . TOTAL KNEE ARTHROPLASTY Right 07/07/2014   Procedure: RIGHT TOTAL KNEE ARTHROPLASTY;  Surgeon: [name], MD;  Location: [facility];  Service: Orthopedics;  Laterality: Right;  . TOTAL KNEE ARTHROPLASTY Left 07/06/2015   Procedure: LEFT TOTAL KNEE ARTHROPLASTY;  Surgeon: Gaynelle Arabian, MD;  Location: [facility];  Service: Orthopedics;  Laterality: Left;    There were no vitals filed for this  visit.     11/08/17 1534  Symptoms/Limitations  Subjective Has been running around town all day, tired today. No pain or falls to report.   Patient is accompained by: Family member  Pertinent History L TKR 06/2015, R TKR 2016  Patient Stated Goals Pt's goals for therapy are to not fall anymore.  Pain Assessment  Currently in Pain? No/denies      11/08/17 1536  Ambulation/Gait  Ambulation/Gait Yes  Ambulation/Gait Assistance 4: Min guard;4: Min assist  Ambulation Distance (Feet)  (around track- see gait comments)  Assistive device None  Gait Pattern Step-through pattern;Decreased arm swing - left;Decreased step length - right;Decreased stance time - right;Right flexed knee in stance;Decreased trunk rotation;Poor foot clearance - right;Trunk flexed  Ambulation Surface Level;Indoor  Gait Comments dynamic gait: around track for 3 laps at pt self selected speed on turns, bursts of speed on straight aways with cues for pt to "keep up with me" with PTA setting faster pace; resisted gait by student with PTA using resistance belt and PTA guarding pt and prompting "power stride'" with increased step length/increased pace to "keep up with me". cues needed with all gait for increased arm swing as well.      11/08/17 1555  Balance Exercises: Standing  Wall Bumps Hip  Wall Bumps-Hips Eyes opened;Anterior/posterior;Foam/compliant surface;15 reps;Limitations  Balance Beam in parallel bars standing across blue foam beam:  alternating fwd stepping to floor/back onto beam, then alternating bwd stepping to floor/back onto beam x 10 reps each with light to no UE support on bars. cues for step length, step height and weight shifting with this activity, min guard to min assist for balance.   Balance Exercises: Standing  Wall Bumps Limitations standing across blue foam beam with chair in front of her for safety: max multimodal cues needed on corrent ex technique, weight shifting and min assist for balance.          PT Short Term Goals - 10/25/17 1140      PT SHORT TERM GOAL #1   Title  Pt will perform HEP with husband's supervision for improved functional mobility, balance, gait.  TARGET 09/15/17    Time  4    Period  Weeks    Status  Achieved      PT SHORT TERM GOAL #2   Title  Pt will improve 5x sit<>stand in less than or equal to 15 seconds to demonstrate improved functional strength.  GOAL ONGOING, TARGET 11/24/17    Time  4    Period  Weeks    Status  Not Met Ongoing    Target Date  11/24/17      PT SHORT TERM GOAL #3   Title  Pt will improve TUG score to less than or equal to 13.5 seconds for decreased fall risk.  GOAL ONGOING, TARGET 11/24/17    Time  4    Period  Weeks    Status  Not Met Ongoing    Target Date  11/24/17      PT SHORT TERM GOAL #4   Title  Pt/husband will report 50% improvement in bed mobility/car transfer tasks for improved functional mobility.    Baseline  per patient report    Time  4    Period  Weeks    Status  Achieved      PT SHORT TERM GOAL #5   Title  Pt will ambulate outdoor surfaces, 300-500 ft, with supervision, with least restrictive assistive device for improved community mobility.    Time  4    Period  Weeks    Status  New    Target Date  11/24/17        PT Long Term Goals - 10/26/17 2113      PT LONG TERM GOAL #1   Title  Pt will verbalize understanding of fall prevention in home environment.  TARGET 12/21/17    Time  8    Period  Weeks    Status  On-going    Target Date  12/21/17      PT LONG TERM GOAL #2   Title  Pt will perform 5x sit<>stand in less than or equal to 13 seconds for improved functional lower extremity strength.    Time  8    Period  Weeks    Status  On-going    Target Date  12/21/17      PT LONG TERM GOAL #3   Title  Pt will improve Dynamic Gait index score to at least 15/24 for decreased fall risk.    Time  8    Status  On-going    Target Date  12/21/17      PT LONG TERM GOAL #4   Title  Pt will  improve gait velocity to at least 2.62 ft/sec for improved gait efficiency and safety.    Time  8    Period  Weeks  Status  On-going    Target Date  12/21/17          11/08/17 1535  Plan  Clinical Impression Statement Today's skilled session continued to focus on activities to increase pt's step length/gait speed and on stepping strategies/balance reactions. Pt is making steady progress toward goals and should benefit from continued PT to progress toward unmet goals.   Pt will benefit from skilled therapeutic intervention in order to improve on the following deficits Abnormal gait;Decreased balance;Decreased mobility;Difficulty walking;Decreased strength;Postural dysfunction  Rehab Potential Good  Clinical Impairments Affecting Rehab Potential memory loss/supportive spouse; has had therapy in the past  PT Frequency 2x / week  PT Duration 8 weeks (per recert 4/83/07)  PT Treatment/Interventions ADLs/Self Care Home Management;DME Instruction;Balance training;Therapeutic exercise;Therapeutic activities;Functional mobility training;Gait training;Neuromuscular re-education;Patient/family education  PT Next Visit Plan Continue to work on standing balance exercises-stepping, weightshifting, gait activities including resisted gait; compliant surfaces  Consulted and Agree with Plan of Care Patient         Patient will benefit from skilled therapeutic intervention in order to improve the following deficits and impairments:  Abnormal gait, Decreased balance, Decreased mobility, Difficulty walking, Decreased strength, Postural dysfunction  Visit Diagnosis: Unsteadiness on feet  Other abnormalities of gait and mobility  Muscle weakness (generalized)     Problem List Patient Active Problem List   Diagnosis Date Noted  . Gait abnormality 08/15/2017  . Memory loss 08/15/2017  . Chronic low back pain 07/01/2016  . Abnormality of gait 05/12/2016  . Paresthesia 05/12/2016  . OA  (osteoarthritis) of knee 07/07/2014    Willow Ora, PTA, [facility] [address] [place], [place] [address] [phone] 11/10/17, 4:05 PM   Name: [name] MRN: [id number] Date of Birth: [birth year]

## 2017-11-14 DIAGNOSIS — F209 Schizophrenia, unspecified: Secondary | ICD-10-CM | POA: Diagnosis not present

## 2017-11-15 ENCOUNTER — Encounter: Payer: Self-pay | Admitting: Physical Therapy

## 2017-11-15 ENCOUNTER — Ambulatory Visit: Payer: Medicare Other | Admitting: Physical Therapy

## 2017-11-15 DIAGNOSIS — R2681 Unsteadiness on feet: Secondary | ICD-10-CM | POA: Diagnosis not present

## 2017-11-15 DIAGNOSIS — M6281 Muscle weakness (generalized): Secondary | ICD-10-CM

## 2017-11-15 DIAGNOSIS — R2689 Other abnormalities of gait and mobility: Secondary | ICD-10-CM

## 2017-11-15 NOTE — Therapy (Signed)
[facility] [address] [place], [place], [address] Phone: [phone]   Fax:  [phone]  Physical Therapy Treatment  Patient Details  Name: [name] MRN: [id number] Date of Birth: [birth year] Referring Provider: Marcial Pacas   Encounter Date: 11/15/2017  PT End of Session - 11/15/17 1021    Visit Number  12    Number of Visits  22 per recert 9/48/54    Date for PT Re-Evaluation  01/23/18    Authorization Type  Medicare and Federal BCBS    Authorization Time Period  Medicare cert:  11/02/01-5/00/93    PT Start Time  1015    PT Stop Time  1100    PT Time Calculation (min)  45 min    Equipment Utilized During Treatment  Gait belt    Activity Tolerance  Patient tolerated treatment well    Behavior During Therapy  WFL for tasks assessed/performed       Past Medical History:  Diagnosis Date  . Anxiety   . Arthritis   . Asthma    as a child  . Bipolar 1 disorder ([place])   . Cancer ([place])    hx breast cancer  . Depression   . Diabetes mellitus without complication ([place])    type 2  . Foot drop   . History of breast cancer 1995   lumpectomy and radiation left breast  . History of duodenal ulcer 1989  . Hyperlipidemia   . Hypertension   . Stress incontinence     Past Surgical History:  Procedure Laterality Date  . BREAST LUMPECTOMY  1995   left breast / radiation  . DILATION AND CURETTAGE OF UTERUS  1989  . JOINT REPLACEMENT    . KNEE ARTHROSCOPY     rt knee  . TONSILLECTOMY    . TOTAL KNEE ARTHROPLASTY Right 07/07/2014   Procedure: RIGHT TOTAL KNEE ARTHROPLASTY;  Surgeon: [name], MD;  Location: [facility];  Service: Orthopedics;  Laterality: Right;  . TOTAL KNEE ARTHROPLASTY Left 07/06/2015   Procedure: LEFT TOTAL KNEE ARTHROPLASTY;  Surgeon: Gaynelle Arabian, MD;  Location: [facility];  Service: Orthopedics;  Laterality: Left;    There were no vitals filed for this visit.  Subjective Assessment -  11/15/17 1020    Subjective  No pain or falls to report. Reports arthritis is bothering her a bit.     Patient is accompained by:  Family member    Pertinent History  L TKR 06/2015, R TKR 2016    Patient Stated Goals  Pt's goals for therapy are to not fall anymore.    Currently in Pain?  No/denies       Gait- Started with 4 laps around the track totaling approx. 460 ft. with VCs to increase step length, reciprocal arm swing, and maintain upright posture with head up. Pt. expressed a fear of falling which she says prevents her from taking the larger steps. She required only supervision during ambulation to complete the exercise safely with the unfamiliar movements of arms and legs.   Balance- Utilized blue balance foam balance beam in the parallel bars and had patient walk tandem down forward, then backward on the return 2x while holding onto the bars, with min guard due to the same fear of falling that was previously expressed. Pt. needed VCs and TCs to maintain proper sequence and step length to keep from stepping on her own shoes. Progressed to tandem + tap to the floor forward and  backward with VCs and TCs to maintain sequence and proper exercise technique. Min guard with both exercises due to her fear of falling. Moved to stepping strategy forward and back from blue beam with min guard assist and  TCs and VCs to maintain posture and get heel of leading leg on ground to center balance. Removed blue beam and did the weight shift with anterior step on the floor of parallel bars and foot placement with heel contact improved.      PT Short Term Goals - 10/25/17 1140      PT SHORT TERM GOAL #1   Title  Pt will perform HEP with husband's supervision for improved functional mobility, balance, gait.  TARGET 09/15/17    Time  4    Period  Weeks    Status  Achieved      PT SHORT TERM GOAL #2   Title  Pt will improve 5x sit<>stand in less than or equal to 15 seconds to demonstrate improved functional  strength.  GOAL ONGOING, TARGET 11/24/17    Time  4    Period  Weeks    Status  Not Met Ongoing    Target Date  11/24/17      PT SHORT TERM GOAL #3   Title  Pt will improve TUG score to less than or equal to 13.5 seconds for decreased fall risk.  GOAL ONGOING, TARGET 11/24/17    Time  4    Period  Weeks    Status  Not Met Ongoing    Target Date  11/24/17      PT SHORT TERM GOAL #4   Title  Pt/husband will report 50% improvement in bed mobility/car transfer tasks for improved functional mobility.    Baseline  per patient report    Time  4    Period  Weeks    Status  Achieved      PT SHORT TERM GOAL #5   Title  Pt will ambulate outdoor surfaces, 300-500 ft, with supervision, with least restrictive assistive device for improved community mobility.    Time  4    Period  Weeks    Status  New    Target Date  11/24/17        PT Long Term Goals - 10/26/17 2113      PT LONG TERM GOAL #1   Title  Pt will verbalize understanding of fall prevention in home environment.  TARGET 12/21/17    Time  8    Period  Weeks    Status  On-going    Target Date  12/21/17      PT LONG TERM GOAL #2   Title  Pt will perform 5x sit<>stand in less than or equal to 13 seconds for improved functional lower extremity strength.    Time  8    Period  Weeks    Status  On-going    Target Date  12/21/17      PT LONG TERM GOAL #3   Title  Pt will improve Dynamic Gait index score to at least 15/24 for decreased fall risk.    Time  8    Status  On-going    Target Date  12/21/17      PT LONG TERM GOAL #4   Title  Pt will improve gait velocity to at least 2.62 ft/sec for improved gait efficiency and safety.    Time  8    Period  Weeks    Status  On-going  Target Date  12/21/17            Plan - 11/15/17 1224    Clinical Impression Statement  Today's session focused on improving gait technique, posture, and balance. Pt. would benefit from continued PT to address remaining gait, postural, and  balance deficits and to work toward unmet goals.    Rehab Potential  Good    Clinical Impairments Affecting Rehab Potential  memory loss/supportive spouse; has had therapy in the past    PT Frequency  2x / week    PT Duration  8 weeks per recert 0/37/95    PT Treatment/Interventions  ADLs/Self Care Home Management;DME Instruction;Balance training;Therapeutic exercise;Therapeutic activities;Functional mobility training;Gait training;Neuromuscular re-education;Patient/family education    PT Next Visit Plan  Continue to work on standing balance exercises-stepping, emphasize weightshifting, gait activities including resisted gait; compliant surfaces; postural exercises    Consulted and Agree with Plan of Care  Patient       Patient will benefit from skilled therapeutic intervention in order to improve the following deficits and impairments:  Abnormal gait, Decreased balance, Decreased mobility, Difficulty walking, Decreased strength, Postural dysfunction  Visit Diagnosis: Unsteadiness on feet  Other abnormalities of gait and mobility  Muscle weakness (generalized)     Problem List Patient Active Problem List   Diagnosis Date Noted  . Gait abnormality 08/15/2017  . Memory loss 08/15/2017  . Chronic low back pain 07/01/2016  . Abnormality of gait 05/12/2016  . Paresthesia 05/12/2016  . OA (osteoarthritis) of knee 07/07/2014    Arthor Captain, SPTA 11/15/2017, 12:28 PM  [facility] [address] [place], [place], [address] Phone: [phone]   Fax:  [phone]  Name: [name] MRN: [id number] Date of Birth: February 03, 1946

## 2017-11-16 ENCOUNTER — Ambulatory Visit (INDEPENDENT_AMBULATORY_CARE_PROVIDER_SITE_OTHER): Payer: Medicare Other | Admitting: Neurology

## 2017-11-16 ENCOUNTER — Encounter: Payer: Self-pay | Admitting: Neurology

## 2017-11-16 VITALS — BP 123/72 | HR 91 | Ht 64.75 in | Wt 167.5 lb

## 2017-11-16 DIAGNOSIS — R269 Unspecified abnormalities of gait and mobility: Secondary | ICD-10-CM | POA: Diagnosis not present

## 2017-11-16 NOTE — Progress Notes (Signed)
PATIENT: [name] DOB: 11/25/1945  Chief Complaint  Patient presents with  . Gait Abnormality    She is here with her husband, [name].  They would like to review her MRI results.  She is still having balance issues.  Reports four more falls.  She is currently going to PT twice weekly.     HISTORICAL  [name] is a 72 year old right-handed female, accompanied by her husband seen in refer by her primary care physician Dr. Darcus Austin for evaluation of right foot drop, initial evaluation was on May 12 2016.  I reviewed and summarized the referring note, she had a history of type 2 diabetes, hyperlipidemia, osteopenia, history of left breast cancer in 1995, status post lobectomy, followed by radiation therapy, she right knee replacement by Dr.  Maureen Ralphs on July 07 2014, left knee replacement in March 2017, She was diagnosed with paranoid schizophrenia since 1977,  she has been stable on stable dose of Abilify 30mg  daily since 2002, she was treated with Risperidone in the past.  She developed right knee pain in 2006, was evaluated by orthopedic surgeon, was noted to have gait abnormality, was given the diagnosis of right foot drop, but she denies sensory loss of her right foot, denies significant low back pain, over the years, she noticed that she has to purposelly raise her right leg to walk better.  Since summer of 2017, while walking for extended period of time, she noted more difficulty, she complains few years history of urinary incontinence, wearing pads, she denies significant low back pain, neck pain, she does have intermittent bilateral feet paresthesia, denies bilateral upper extremity weakness or numbness,  Because of long-term neuroleptic medication treatment, she developed mild parkinsonian features, right hand tremor, was given the prescription of artane without significant improvement of her symptoms.  Laboratory evaluations, glucose was 118,    Update July 01 2016: She came in for electrodiagnostic study today, which showed no significant abnormality, in specific, there is no evidence of large fiber peripheral neuropathy or bilateral lumbosacral radiculopathy.  Today she complains of low back pain, unsteady gait, intermittent right hand tremor,  We have personally reviewed MRI of the cervical in February 2018, multilevel degenerative disc disease no significant canal or foraminal stenosis.  UPDATE December 28 2016: She is accompanied by her husband at today's clinical visit, she no longer has bilateral knee pain, but complains of gait abnormality, balance issues, she was doing better when she had physical therapy and exercise regularly, now she is much more sedentary,  UPDATE August 15 2017: She is accompanied by her husband at today's clinical visit, she fell few weeks ago, she was walking with her husband at the neighborhood, then all of a sudden, she stood still, readily sliding down forward, need 2 people assistance to get up, no loss of consciousness, her husband has to drive her back home, she needed assistance to get in and out of the car, complained of fatigue slept rest of the day,  Husband also noted patient has gradual worsening memory loss, decreased functional level, she does not feel comfortable driving long distance anymore, still active at church, able to drive independently to church, grocery shopping few times each week,  But she tends to get frustrated easily, has broke car key few times,  UPDATE November 16 2017: She had a physical therapy, made some progress, she had a few more fall accident, but without self injury,  MRI of the brain April 2019  showed mild generalized atrophy, scattered supratentorium small vessel disease.  Laboratory evaluations on August 15, 2017 CMP showed glucose of 121, sodium was mildly decreased 133, normal CBC, hemoglobin of 11.5, B12 of 1599, A1c was elevated 7.8, normal RPR, TSH  REVIEW OF SYSTEMS: Full 14  system review of systems performed and notable only for: Drooling, aching muscles, depression anxiety and fatigue  ALLERGIES: Allergies  Allergen Reactions  . Ciprofloxacin Hcl Rash  . Latex Rash  . Penicillins Rash    Has patient had a PCN reaction causing immediate rash, facial/tongue/throat swelling, SOB or lightheadedness with hypotension: Yes Has patient had a PCN reaction causing severe rash involving mucus membranes or skin necrosis: No Has patient had a PCN reaction that required hospitalization No Has patient had a PCN reaction occurring within the last 10 years: Yes If all of the above answers are "NO", then may proceed with Cephalosporin use.  . Sulfa Antibiotics Rash    HOME MEDICATIONS: Current Outpatient Medications  Medication Sig Dispense Refill  . ARIPiprazole (ABILIFY) 30 MG tablet Take 30 mg by mouth at bedtime.    Marland Kitchen aspirin 81 MG tablet Chew by mouth daily.    . clonazePAM (KLONOPIN) 0.5 MG tablet Taking two tablets at bedtime and one additional tablet daily, if needed.    . insulin lispro protamine-lispro (HUMALOG 75/25 MIX) (75-25) 100 UNIT/ML SUSP injection Inject 12-14 Units into the skin 2 (two) times daily. Sliding scale depending on time of day and what eating.    . metFORMIN (GLUCOPHAGE-XR) 500 MG 24 hr tablet Take 1,000 mg by mouth 2 (two) times daily.  5  . methocarbamol (ROBAXIN) 500 MG tablet Take 1 tablet (500 mg total) by mouth every 6 (six) hours as needed for muscle spasms. 80 tablet 0  . pravastatin (PRAVACHOL) 20 MG tablet Take 20 mg by mouth daily after breakfast.    . ramipril (ALTACE) 5 MG capsule Take 5 mg by mouth at bedtime.    . sitaGLIPtin (JANUVIA) 100 MG tablet Take 100 mg by mouth daily with breakfast.    . traMADol (ULTRAM) 50 MG tablet Take 1-2 tablets (50-100 mg total) by mouth every 6 (six) hours as needed (mild pain). 80 tablet 1   No current facility-administered medications for this visit.     PAST MEDICAL HISTORY: Past Medical  History:  Diagnosis Date  . Anxiety   . Arthritis   . Asthma    as a child  . Bipolar 1 disorder ([place])   . Cancer ([place])    hx breast cancer  . Depression   . Diabetes mellitus without complication ([place])    type 2  . Foot drop   . History of breast cancer 1995   lumpectomy and radiation left breast  . History of duodenal ulcer 1989  . Hyperlipidemia   . Hypertension   . Stress incontinence     PAST SURGICAL HISTORY: Past Surgical History:  Procedure Laterality Date  . BREAST LUMPECTOMY  1995   left breast / radiation  . DILATION AND CURETTAGE OF UTERUS  1989  . JOINT REPLACEMENT    . KNEE ARTHROSCOPY     rt knee  . TONSILLECTOMY    . TOTAL KNEE ARTHROPLASTY Right 07/07/2014   Procedure: RIGHT TOTAL KNEE ARTHROPLASTY;  Surgeon: [name], MD;  Location: [facility];  Service: Orthopedics;  Laterality: Right;  . TOTAL KNEE ARTHROPLASTY Left 07/06/2015   Procedure: LEFT TOTAL KNEE ARTHROPLASTY;  Surgeon: Gaynelle Arabian, MD;  Location: [facility];  Service: Orthopedics;  Laterality: Left;    FAMILY HISTORY: Family History  Problem Relation Age of Onset  . Schizophrenia Mother   . Prostate cancer Father   . Aneurysm Father        stomach  . Diabetes Father   . Colitis Paternal Grandmother     SOCIAL HISTORY:  Social History   Socioeconomic History  . Marital status: Married    Spouse name: Not on file  . Number of children: 1  . Years of education: Associates  . Highest education level: Not on file  Occupational History  . Occupation: Retired  Scientific laboratory technician  . Financial resource strain: Not on file  . Food insecurity:    Worry: Not on file    Inability: Not on file  . Transportation needs:    Medical: Not on file    Non-medical: Not on file  Tobacco Use  . Smoking status: Never Smoker  . Smokeless tobacco: Never Used  Substance and Sexual Activity  . Alcohol use: Yes    Comment: occasional  . Drug use: No  . Sexual activity: Not on file  Lifestyle  .  Physical activity:    Days per week: Not on file    Minutes per session: Not on file  . Stress: Not on file  Relationships  . Social connections:    Talks on phone: Not on file    Gets together: Not on file    Attends religious service: Not on file    Active member of club or organization: Not on file    Attends meetings of clubs or organizations: Not on file    Relationship status: Not on file  . Intimate partner violence:    Fear of current or ex partner: Not on file    Emotionally abused: Not on file    Physically abused: Not on file    Forced sexual activity: Not on file  Other Topics Concern  . Not on file  Social History Narrative   Lives at home with husband.   Right-handed.   3-4 cups caffeine.     PHYSICAL EXAM   Vitals:   11/16/17 1315  BP: 123/72  Pulse: 91  Weight: 167 lb 8 oz (76 kg)  Height: 5' 4.75" (1.645 m)    Not recorded      Body mass index is 28.09 kg/m.  PHYSICAL EXAMNIATION:  Gen: NAD, conversant, well nourised, obese, well groomed                     Cardiovascular: Regular rate rhythm, no peripheral edema, warm, nontender. Eyes: Conjunctivae clear without exudates or hemorrhage Neck: Supple, no carotid bruits. Pulmonary: Clear to auscultation bilaterally   NEUROLOGICAL EXAM:  MENTAL STATUS: Speech:    Speech is normal; fluent and spontaneous with normal comprehension.  Cognition:     Orientation to time, place and person     Normal recent and remote memory     Normal Attention span and concentration     Normal Language, naming, repeating,spontaneous speech     Fund of knowledge   CRANIAL NERVES: CN II: Visual fields are full to confrontation. Fundoscopic exam is normal with sharp discs and no vascular changes. Pupils are round equal and briskly reactive to light. CN III, IV, VI: extraocular movement are normal. No ptosis. CN V: Facial sensation is intact to pinprick in all 3 divisions bilaterally. Corneal responses are intact.    CN VII: Face is symmetric  with normal eye closure and smile. CN VIII: Hearing is normal to rubbing fingers CN IX, X: Palate elevates symmetrically. Phonation is normal. CN XI: Head turning and shoulder shrug are intact CN XII: Tongue is midline with normal movements and no atrophy.  MOTOR: She has mild bilateral upper extremity rigidity, slight right ankle dorsiflexion weakness,  REFLEXES: Reflexes are 3 and symmetric at the biceps, triceps, knees, and ankles. Plantar responses are flexor.  SENSORY: Intact to light touch, pinprick, positional sensation and vibratory sensation are intact in fingers and toes.  COORDINATION: Rapid alternating movements and fine finger movements are intact. There is no dysmetria on finger-to-nose and heel-knee-shin.    GAIT/STANCE: Deliberate effort, wide-based, cautious, dragging right side  DIAGNOSTIC DATA (LABS, IMAGING, TESTING) - I reviewed patient records, labs, notes, testing and imaging myself where available.   ASSESSMENT AND PLAN  [name] is a 72 y.o. female   Gait abnormality, falling episode Worsening memory loss  Multifactorial, bilateral knee replacement, joints pain, deconditioning, medication side effect, underlying mood disorder, mild generalized atrophy, supratentorium small vessel disease  Extensive evaluation above, includes MRI of the brain, cervical spine, laboratory evaluations showed no acute treatable etiology  Continue physical therapy, moderate exercise   Marcial Pacas, M.D. Ph.D.  [facility] [address] [place], [place] [address] Ph: [phone] Fax: [phone]  CC: Darcus Austin, MD

## 2017-11-17 ENCOUNTER — Ambulatory Visit: Payer: Medicare Other | Admitting: Physical Therapy

## 2017-11-17 ENCOUNTER — Encounter: Payer: Self-pay | Admitting: Physical Therapy

## 2017-11-17 DIAGNOSIS — R2681 Unsteadiness on feet: Secondary | ICD-10-CM | POA: Diagnosis not present

## 2017-11-17 DIAGNOSIS — R2689 Other abnormalities of gait and mobility: Secondary | ICD-10-CM

## 2017-11-17 DIAGNOSIS — M6281 Muscle weakness (generalized): Secondary | ICD-10-CM

## 2017-11-17 NOTE — Therapy (Deleted)
[facility] [address] [place], [place], [address] Phone: [phone]   Fax:  [phone]  Physical Therapy Treatment  Patient Details  Name: [name] MRN: [id number] Date of Birth: 03/06/1946 Referring Provider: Marcial Pacas   Encounter Date: 11/17/2017  PT End of Session - 11/17/17 1150    Visit Number  13    Number of Visits  22 per recert 4/88/89    Date for PT Re-Evaluation  01/23/18    Authorization Type  Medicare and Federal BCBS    Authorization Time Period  Medicare cert:  1/69/45-0/38/88    PT Start Time  1146    PT Stop Time  1230    PT Time Calculation (min)  44 min    Equipment Utilized During Treatment  Gait belt    Activity Tolerance  Patient tolerated treatment well    Behavior During Therapy  [facility] for tasks assessed/performed       Past Medical History:  Diagnosis Date  . Anxiety   . Arthritis   . Asthma    as a child  . Bipolar 1 disorder ([place])   . Cancer ([place])    hx breast cancer  . Depression   . Diabetes mellitus without complication ([place])    type 2  . Foot drop   . History of breast cancer 1995   lumpectomy and radiation left breast  . History of duodenal ulcer 1989  . Hyperlipidemia   . Hypertension   . Stress incontinence     Past Surgical History:  Procedure Laterality Date  . BREAST LUMPECTOMY  1995   left breast / radiation  . DILATION AND CURETTAGE OF UTERUS  1989  . JOINT REPLACEMENT    . KNEE ARTHROSCOPY     rt knee  . TONSILLECTOMY    . TOTAL KNEE ARTHROPLASTY Right 07/07/2014   Procedure: RIGHT TOTAL KNEE ARTHROPLASTY;  Surgeon: [name], MD;  Location: [facility];  Service: Orthopedics;  Laterality: Right;  . TOTAL KNEE ARTHROPLASTY Left 07/06/2015   Procedure: LEFT TOTAL KNEE ARTHROPLASTY;  Surgeon: Gaynelle Arabian, MD;  Location: [facility];  Service: Orthopedics;  Laterality: Left;    There were no vitals filed for this visit.  Subjective Assessment -  11/17/17 1149    Subjective  No pain today, but pt. reports that she fell backwards in her laundry room yesterday while folding clothes, landing in her laundry basket and did not get hurt. Reports she took an Aleve to manage her arthritis today.    Patient is accompained by:  Family member    Pertinent History  L TKR 06/2015, R TKR 2016    Patient Stated Goals  Pt's goals for therapy are to not fall anymore.    Currently in Pain?  No/denies       Gait- Started with 2 laps around the track totaling approx. 230 ft. with VCs to increase step length, reciprocal arm swing, and maintain upright posture with head up. Pt. expressed a fear of falling which she says prevents her from taking the larger steps. She required only supervision during ambulation to complete the exercise safely with the unfamiliar movements of arms and legs. Completed a 3rd lap totaling 115 ft. later in the session with increased missteps due to fatigue, and required min guard. Pt. took a rest break following this lap to allow LE muscles to recover and enable her to continue with session.  Stepping strategy in the parallel bars working on  shifting weight forward over leading leg, and back as well as back over leading leg when walking backwards. Walking backwards/forwards down and back 4x, VCs required to take big steps in order to avoid stepping on her own heels when walking backward, min guard with UE assist on the parallel bars. Added cone taps to the front and target stepping to dots to the back x5 reps to end session once pt. got the hang of the weight shifting.  Stepping with reaching at the counter- to the side 2 sets, 10 reps; up with step 2 sets, 10 reps; rotating at waist, 2 sets, 10 reps; supervision with all to ensure safety with movement and minimal VCs to keep reaches big and steps from getting progressively smaller    PT Short Term Goals - 10/25/17 1140      PT SHORT TERM GOAL #1   Title  Pt will perform HEP with  husband's supervision for improved functional mobility, balance, gait.  TARGET 09/15/17    Time  4    Period  Weeks    Status  Achieved      PT SHORT TERM GOAL #2   Title  Pt will improve 5x sit<>stand in less than or equal to 15 seconds to demonstrate improved functional strength.  GOAL ONGOING, TARGET 11/24/17    Time  4    Period  Weeks    Status  Not Met Ongoing    Target Date  11/24/17      PT SHORT TERM GOAL #3   Title  Pt will improve TUG score to less than or equal to 13.5 seconds for decreased fall risk.  GOAL ONGOING, TARGET 11/24/17    Time  4    Period  Weeks    Status  Not Met Ongoing    Target Date  11/24/17      PT SHORT TERM GOAL #4   Title  Pt/husband will report 50% improvement in bed mobility/car transfer tasks for improved functional mobility.    Baseline  per patient report    Time  4    Period  Weeks    Status  Achieved      PT SHORT TERM GOAL #5   Title  Pt will ambulate outdoor surfaces, 300-500 ft, with supervision, with least restrictive assistive device for improved community mobility.    Time  4    Period  Weeks    Status  New    Target Date  11/24/17        PT Long Term Goals - 10/26/17 2113      PT LONG TERM GOAL #1   Title  Pt will verbalize understanding of fall prevention in home environment.  TARGET 12/21/17    Time  8    Period  Weeks    Status  On-going    Target Date  12/21/17      PT LONG TERM GOAL #2   Title  Pt will perform 5x sit<>stand in less than or equal to 13 seconds for improved functional lower extremity strength.    Time  8    Period  Weeks    Status  On-going    Target Date  12/21/17      PT LONG TERM GOAL #3   Title  Pt will improve Dynamic Gait index score to at least 15/24 for decreased fall risk.    Time  8    Status  On-going    Target Date  12/21/17  PT LONG TERM GOAL #4   Title  Pt will improve gait velocity to at least 2.62 ft/sec for improved gait efficiency and safety.    Time  8    Period  Weeks     Status  On-going    Target Date  12/21/17        Plan - 11/17/17 1242    Clinical Impression Statement  Today's session focused on stretching anterior trunk through big reaching movements to aid the Parkinson's sx, and on stepping strategies with weight shifting to address her remaining balance deficits. Pt. would continue to benefit from PT to work toward her remaining unmet goals and improve balance reactions when falling backward.     Rehab Potential  Good    Clinical Impairments Affecting Rehab Potential  memory loss/supportive spouse; has had therapy in the past    PT Frequency  2x / week    PT Duration  8 weeks per recert 9/38/18    PT Treatment/Interventions  ADLs/Self Care Home Management;DME Instruction;Balance training;Therapeutic exercise;Therapeutic activities;Functional mobility training;Gait training;Neuromuscular re-education;Patient/family education    PT Next Visit Plan  Continue to work on standing balance exercises-stepping, emphasize weightshifting, gait activities including resisted gait; compliant surfaces; postural exercises, balance reactions when moving backward due to fall, big movements to address Parkinson's sx    Consulted and Agree with Plan of Care  Patient       Patient will benefit from skilled therapeutic intervention in order to improve the following deficits and impairments:  Abnormal gait, Decreased balance, Decreased mobility, Difficulty walking, Decreased strength, Postural dysfunction  Visit Diagnosis: Unsteadiness on feet  Other abnormalities of gait and mobility  Muscle weakness (generalized)     Problem List Patient Active Problem List   Diagnosis Date Noted  . Gait abnormality 08/15/2017  . Memory loss 08/15/2017  . Chronic low back pain 07/01/2016  . Abnormality of gait 05/12/2016  . Paresthesia 05/12/2016  . OA (osteoarthritis) of knee 07/07/2014    Arthor Captain, SPTA 11/17/2017, 1:15 PM  [facility] [address] [place], [place], [address] Phone: [phone]   Fax:  [phone]  Name: CURTISTINE PETTITT MRN: [id number] Date of Birth: Jun 26, 1945

## 2017-11-19 NOTE — Therapy (Signed)
[facility] [address] [name], [place], [address] Phone: [phone]   Fax:  [phone]   Physical Therapy Treatment  Patient Details  Name: [name] MRN: [id number] Date of Birth: October 24, 1945 Referring Provider: Marcial Pacas   Encounter Date: 11/17/2017    11/17/17 1150  PT Visits / Re-Eval  Visit Number 13  Number of Visits 22 (per recert 2/83/15)  Date for PT Re-Evaluation 01/23/18  Authorization  Authorization Type Medicare and Federal BCBS  Authorization Time Period Medicare cert:  1/76/16-0/73/71  PT Time Calculation  PT Start Time 1146  PT Stop Time 1230  PT Time Calculation (min) 44 min  PT - End of Session  Equipment Utilized During Treatment Gait belt  Activity Tolerance Patient tolerated treatment well  Behavior During Therapy [facility] for tasks assessed/performed     Past Medical History:  Diagnosis Date  . Anxiety   . Arthritis   . Asthma    as a child  . Bipolar 1 disorder ([place])   . Cancer ([place])    hx breast cancer  . Depression   . Diabetes mellitus without complication ([place])    type 2  . Foot drop   . History of breast cancer 1995   lumpectomy and radiation left breast  . History of duodenal ulcer 1989  . Hyperlipidemia   . Hypertension   . Stress incontinence     Past Surgical History:  Procedure Laterality Date  . BREAST LUMPECTOMY  1995   left breast / radiation  . DILATION AND CURETTAGE OF UTERUS  1989  . JOINT REPLACEMENT    . KNEE ARTHROSCOPY     rt knee  . TONSILLECTOMY    . TOTAL KNEE ARTHROPLASTY Right 07/07/2014   Procedure: RIGHT TOTAL KNEE ARTHROPLASTY;  Surgeon: [name], MD;  Location: [facility];  Service: Orthopedics;  Laterality: Right;  . TOTAL KNEE ARTHROPLASTY Left 07/06/2015   Procedure: LEFT TOTAL KNEE ARTHROPLASTY;  Surgeon: Gaynelle Arabian, MD;  Location: [facility];  Service: Orthopedics;  Laterality: Left;    There were no vitals filed for this  visit.     11/17/17 1149  Symptoms/Limitations  Subjective No pain today, but pt. reports that she fell backwards in her laundry room yesterday while folding clothes, landing in her laundry basket and did not get hurt. Reports she took an Aleve to manage her arthritis today.  Patient is accompained by: Family member  Pertinent History L TKR 06/2015, R TKR 2016  Patient Stated Goals Pt's goals for therapy are to not fall anymore.  Pain Assessment  Currently in Pain? No/denies    Gait- Started with 2 laps around the track totaling approx. 230 ft. with VCs to increase step length, reciprocal arm swing, and maintain upright posture with head up. Pt. expressed a fear of falling which she says prevents her from taking the larger steps. She required only supervision during ambulation to complete the exercise safely with the unfamiliar movements of arms and legs. Completed a 3rd lap totaling 115 ft. later in the session with increased missteps due to fatigue, and required min guard. Pt. took a rest break following this lap to allow LE muscles to recover and enable her to continue with session.  Stepping strategy in the parallel bars working on shifting weight forward over leading leg, and back as well as back over leading leg when walking backwards. Walking backwards/forwards down and back 4x, VCs required to take big steps in order to  avoid stepping on her own heels when walking backward, min guard with UE assist on the parallel bars. Added cone taps to the front and target stepping to dots to the back x5 reps to end session once pt. got the hang of the weight shifting.  Stepping with reaching at the counter- to the side 2 sets, 10 reps; up with step 2 sets, 10 reps; rotating at waist, 2 sets, 10 reps; supervision with all to ensure safety with movement and minimal VCs to keep reaches big and steps from getting progressively smaller     PT Short Term Goals - 10/25/17 1140      PT SHORT TERM GOAL #1    Title  Pt will perform HEP with husband's supervision for improved functional mobility, balance, gait.  TARGET 09/15/17    Time  4    Period  Weeks    Status  Achieved      PT SHORT TERM GOAL #2   Title  Pt will improve 5x sit<>stand in less than or equal to 15 seconds to demonstrate improved functional strength.  GOAL ONGOING, TARGET 11/24/17    Time  4    Period  Weeks    Status  Not Met Ongoing    Target Date  11/24/17      PT SHORT TERM GOAL #3   Title  Pt will improve TUG score to less than or equal to 13.5 seconds for decreased fall risk.  GOAL ONGOING, TARGET 11/24/17    Time  4    Period  Weeks    Status  Not Met Ongoing    Target Date  11/24/17      PT SHORT TERM GOAL #4   Title  Pt/husband will report 50% improvement in bed mobility/car transfer tasks for improved functional mobility.    Baseline  per patient report    Time  4    Period  Weeks    Status  Achieved      PT SHORT TERM GOAL #5   Title  Pt will ambulate outdoor surfaces, 300-500 ft, with supervision, with least restrictive assistive device for improved community mobility.    Time  4    Period  Weeks    Status  New    Target Date  11/24/17        PT Long Term Goals - 10/26/17 2113      PT LONG TERM GOAL #1   Title  Pt will verbalize understanding of fall prevention in home environment.  TARGET 12/21/17    Time  8    Period  Weeks    Status  On-going    Target Date  12/21/17      PT LONG TERM GOAL #2   Title  Pt will perform 5x sit<>stand in less than or equal to 13 seconds for improved functional lower extremity strength.    Time  8    Period  Weeks    Status  On-going    Target Date  12/21/17      PT LONG TERM GOAL #3   Title  Pt will improve Dynamic Gait index score to at least 15/24 for decreased fall risk.    Time  8    Status  On-going    Target Date  12/21/17      PT LONG TERM GOAL #4   Title  Pt will improve gait velocity to at least 2.62 ft/sec for improved gait efficiency and  safety.    Time  8    Period  Weeks    Status  On-going    Target Date  12/21/17         11/17/17 1242  Plan  Clinical Impression Statement Today's session focused on stretching anterior trunk through big reaching movements for postural correction/balance reactions and on stepping strategies with weight shifting to address her remaining balance deficits. Pt. would continue to benefit from PT to work toward her remaining unmet goals and improve balance reactions when falling backward.   Pt will benefit from skilled therapeutic intervention in order to improve on the following deficits Abnormal gait;Decreased balance;Decreased mobility;Difficulty walking;Decreased strength;Postural dysfunction  Rehab Potential Good  Clinical Impairments Affecting Rehab Potential memory loss/supportive spouse; has had therapy in the past  PT Frequency 2x / week  PT Duration 8 weeks (per recert 0/07/12)  PT Treatment/Interventions ADLs/Self Care Home Management;DME Instruction;Balance training;Therapeutic exercise;Therapeutic activities;Functional mobility training;Gait training;Neuromuscular re-education;Patient/family education  PT Next Visit Plan Continue to work on standing balance exercises-stepping, emphasize weightshifting, gait activities including resisted gait; compliant surfaces; postural exercises, balance reactions when moving backward due to fall, big movements to work toward increased step length, improved posture and reciprocal arm swing with gait.   Consulted and Agree with Plan of Care Patient       Patient will benefit from skilled therapeutic intervention in order to improve the following deficits and impairments:  Abnormal gait, Decreased balance, Decreased mobility, Difficulty walking, Decreased strength, Postural dysfunction  Visit Diagnosis: Unsteadiness on feet  Other abnormalities of gait and mobility  Muscle weakness (generalized)     Problem List Patient Active Problem  List   Diagnosis Date Noted  . Gait abnormality 08/15/2017  . Memory loss 08/15/2017  . Chronic low back pain 07/01/2016  . Abnormality of gait 05/12/2016  . Paresthesia 05/12/2016  . OA (osteoarthritis) of knee 07/07/2014    This entire session was performed under direct supervision and direction of a licensed therapist assistant . The therapist was present and activity participating. I have personally read, edited and approve of the note as written. SPTA created note for session which did not have supervising PTA listed for co-sign. New note created so to close encounter by supervising PTA. Session/original note was performed/created by Baruch Merl, SPTA.   Willow Ora, PTA, [facility] [address], [place], [place] [address] [phone] 11/19/17, 1:39 PM   Name: [name] MRN: [id number] Date of Birth: [birth year]

## 2017-11-20 ENCOUNTER — Ambulatory Visit: Payer: Medicare Other | Admitting: Physical Therapy

## 2017-11-20 DIAGNOSIS — E1165 Type 2 diabetes mellitus with hyperglycemia: Secondary | ICD-10-CM | POA: Diagnosis not present

## 2017-11-20 DIAGNOSIS — I10 Essential (primary) hypertension: Secondary | ICD-10-CM | POA: Diagnosis not present

## 2017-11-20 DIAGNOSIS — E1140 Type 2 diabetes mellitus with diabetic neuropathy, unspecified: Secondary | ICD-10-CM | POA: Diagnosis not present

## 2017-11-20 DIAGNOSIS — E7800 Pure hypercholesterolemia, unspecified: Secondary | ICD-10-CM | POA: Diagnosis not present

## 2017-11-20 DIAGNOSIS — R809 Proteinuria, unspecified: Secondary | ICD-10-CM | POA: Diagnosis not present

## 2017-11-20 DIAGNOSIS — M21371 Foot drop, right foot: Secondary | ICD-10-CM | POA: Diagnosis not present

## 2017-11-22 ENCOUNTER — Ambulatory Visit: Payer: Medicare Other | Admitting: Physical Therapy

## 2017-11-22 ENCOUNTER — Encounter: Payer: Self-pay | Admitting: Physical Therapy

## 2017-11-22 DIAGNOSIS — M6281 Muscle weakness (generalized): Secondary | ICD-10-CM

## 2017-11-22 DIAGNOSIS — R2681 Unsteadiness on feet: Secondary | ICD-10-CM | POA: Diagnosis not present

## 2017-11-22 DIAGNOSIS — R2689 Other abnormalities of gait and mobility: Secondary | ICD-10-CM

## 2017-11-22 NOTE — Therapy (Addendum)
[facility] [address] [place], [place], [address] Phone: [phone]   Fax:  [phone]  Physical Therapy Treatment  Patient Details  Name: [name] MRN: [id number] Date of Birth: Dec 07, 1945 Referring Provider: Marcial Pacas   Encounter Date: 11/22/2017  PT End of Session - 11/22/17 0938    Visit Number  14    Number of Visits  22 per recert 2/67/12    Date for PT Re-Evaluation  01/23/18    Authorization Type  Medicare and Federal BCBS    Authorization Time Period  Medicare cert:  4/58/09-9/83/38    PT Start Time  0801    PT Stop Time  0845    PT Time Calculation (min)  44 min    Equipment Utilized During Treatment  Gait belt    Activity Tolerance  Patient tolerated treatment well    Behavior During Therapy  [facility] for tasks assessed/performed       Past Medical History:  Diagnosis Date  . Anxiety   . Arthritis   . Asthma    as a child  . Bipolar 1 disorder ([place])   . Cancer ([place])    hx breast cancer  . Depression   . Diabetes mellitus without complication ([place])    type 2  . Foot drop   . History of breast cancer 1995   lumpectomy and radiation left breast  . History of duodenal ulcer 1989  . Hyperlipidemia   . Hypertension   . Stress incontinence     Past Surgical History:  Procedure Laterality Date  . BREAST LUMPECTOMY  1995   left breast / radiation  . DILATION AND CURETTAGE OF UTERUS  1989  . JOINT REPLACEMENT    . KNEE ARTHROSCOPY     rt knee  . TONSILLECTOMY    . TOTAL KNEE ARTHROPLASTY Right 07/07/2014   Procedure: RIGHT TOTAL KNEE ARTHROPLASTY;  Surgeon: [name], MD;  [facility]: [facility];  Service: Orthopedics;  Laterality: Right;  . TOTAL KNEE ARTHROPLASTY Left 07/06/2015   Procedure: LEFT TOTAL KNEE ARTHROPLASTY;  Surgeon: Gaynelle Arabian, MD;  Location: [facility];  Service: Orthopedics;  Laterality: Left;    There were no vitals filed for this visit.  Subjective Assessment -  11/22/17 0807    Subjective  No pain today and no falls to report. Pt. reports that she started new medication but could not remember the name so she will bring name and instructions next session. Pt presents with sluggishness and says it is due to the new medication.   Patient is accompained by:  Family member    Pertinent History  L TKR 06/2015, R TKR 2016    Patient Stated Goals  Pt's goals for therapy are to not fall anymore.    Currently in Pain?  No/denies    Multiple Pain Sites  No        [facility] PT Assessment - 11/22/17 0812      Transfers   Five time sit to stand comments   14.85      Ambulation/Gait   Ambulation/Gait  Yes    Ambulation/Gait Assistance  4: Min guard;5: Supervision    Ambulation/Gait Assistance Details  occasional veering and support on SPTA needed for balance.     Ambulation Distance (Feet)  500 Feet    Assistive device  None    Gait Pattern  Step-through pattern;Decreased arm swing - left;Decreased step length - right;Decreased stance time - right;Right flexed knee in stance;Decreased  trunk rotation;Poor foot clearance - right;Trunk flexed    Ambulation Surface  Level;Unlevel;Indoor;Outdoor;Paved      Standardized Balance Assessment   Standardized Balance Assessment  Five Times Sit to Stand    Five times sit to stand comments   14.85 sec's with hands on knees using standard height chair      Timed Up and Go Test   TUG  Normal TUG    Normal TUG (seconds)  17.69    TUG Comments  no AD used. repeated 2 times due to pt not following all directions with 1st time.           Stepping with reaching at the counter- first pt. stepped to right and left and reached straight out to her side, shoulder at 90* 1 sets, 10 reps; then she stepped to the right and left and reached over her head shoulder a 150-160* 2 sets, 10 reps;  finally, she remained stationary and, rotating at waist, reached behind her to the right and left 1 sets, 10 reps; supervision with all to ensure  safety with movement and moderate VCs/TCs to keep reaches big and steps from getting progressively smaller and from shifting her weight all onto one leg and picking up the trailing leg, throwing her off balance    PT Short Term Goals - 11/22/17 0827      PT SHORT TERM GOAL #1   Title  Pt will perform HEP with husband's supervision for improved functional mobility, balance, gait.  TARGET 09/15/17    Time  4    Period  Weeks    Status  Achieved      PT SHORT TERM GOAL #2   Title  Pt will improve 5x sit<>stand in less than or equal to 15 seconds to demonstrate improved functional strength.  GOAL ONGOING, TARGET 11/24/17    Baseline  11/22/2017 Met today 14.85 sec with UE support.    Time  4    Period  Weeks    Status  Achieved Ongoing      PT SHORT TERM GOAL #3   Title  Pt will improve TUG score to less than or equal to 13.5 seconds for decreased fall risk.  GOAL ONGOING, TARGET 11/24/17    Baseline  11/22/2017 Not met 17.69 sec. which is slower than initial of 17.22 sec.     Time  4    Period  Weeks    Status  Not Met Ongoing      PT SHORT TERM GOAL #4   Title  Pt/husband will report 50% improvement in bed mobility/car transfer tasks for improved functional mobility.    Baseline  per patient report    Time  4    Period  Weeks    Status  Achieved      PT SHORT TERM GOAL #5   Title  Pt will ambulate outdoor surfaces, 300-500 ft, with supervision, with least restrictive assistive device for improved community mobility.    Baseline  11/22/2017 Partially met today. Pt. can ambulate full distance but requires min guard at times due to slight veering and leaning on SPTA.     Time  4    Period  Weeks    Status  Partially Met        PT Long Term Goals - 10/26/17 2113      PT LONG TERM GOAL #1   Title  Pt will verbalize understanding of fall prevention in home environment.  TARGET 12/21/17    Time  8    Period  Weeks    Status  On-going    Target Date  12/21/17      PT LONG TERM GOAL  #2   Title  Pt will perform 5x sit<>stand in less than or equal to 13 seconds for improved functional lower extremity strength.    Time  8    Period  Weeks    Status  On-going    Target Date  12/21/17      PT LONG TERM GOAL #3   Title  Pt will improve Dynamic Gait index score to at least 15/24 for decreased fall risk.    Time  8    Status  On-going    Target Date  12/21/17      PT LONG TERM GOAL #4   Title  Pt will improve gait velocity to at least 2.62 ft/sec for improved gait efficiency and safety.    Time  8    Period  Weeks    Status  On-going    Target Date  12/21/17        Plan - 11/22/17 0951    Clinical Impression Statement  Today's session focused on looking at STG's and then ended with more reaching and encouraging large steps and movements to improve gait and functional mobility. Pt. achieved her goal for 5x sit to stand, but did not improve her TUG time. She would benefit from further PT to continue addressing her gait and balance deficits to work toward reaching her LTG's.     Rehab Potential  Good    Clinical Impairments Affecting Rehab Potential  memory loss/supportive spouse; has had therapy in the past    PT Frequency  2x / week    PT Duration  8 weeks per recert 7/48/27    PT Treatment/Interventions  ADLs/Self Care Home Management;DME Instruction;Balance training;Therapeutic exercise;Therapeutic activities;Functional mobility training;Gait training;Neuromuscular re-education;Patient/family education    PT Next Visit Plan  Continue to work on standing balance exercises-stepping, emphasize weightshifting, gait activities including resisted gait and gait speed; compliant surfaces; postural exercises, balance reactions when moving backward due to fall, big movements to work toward increased step length, improved posture and reciprocal arm swing with gait.     Consulted and Agree with Plan of Care  Patient       Patient will benefit from skilled therapeutic  intervention in order to improve the following deficits and impairments:  Abnormal gait, Decreased balance, Decreased mobility, Difficulty walking, Decreased strength, Postural dysfunction  Visit Diagnosis: Unsteadiness on feet  Other abnormalities of gait and mobility  Muscle weakness (generalized)     Problem List Patient Active Problem List   Diagnosis Date Noted  . Gait abnormality 08/15/2017  . Memory loss 08/15/2017  . Chronic low back pain 07/01/2016  . Abnormality of gait 05/12/2016  . Paresthesia 05/12/2016  . OA (osteoarthritis) of knee 07/07/2014    Arthor Captain, SPTA 11/22/2017, 9:54 AM  [facility] [address] [place], [place], [address] Phone: [phone]   Fax:  [phone]  Name: [name] MRN: [id number] Date of Birth: [birth year]  This note has been reviewed and edited by supervising CI. Added in gait details.   Willow Ora, PTA, [facility] [address], [place], [place] [address] [phone] 11/23/17, 1:38 PM

## 2017-11-24 ENCOUNTER — Ambulatory Visit: Payer: Medicare Other | Admitting: Physical Therapy

## 2017-11-24 ENCOUNTER — Encounter: Payer: Self-pay | Admitting: Physical Therapy

## 2017-11-24 DIAGNOSIS — R2681 Unsteadiness on feet: Secondary | ICD-10-CM

## 2017-11-24 DIAGNOSIS — R2689 Other abnormalities of gait and mobility: Secondary | ICD-10-CM | POA: Diagnosis not present

## 2017-11-24 DIAGNOSIS — M6281 Muscle weakness (generalized): Secondary | ICD-10-CM | POA: Diagnosis not present

## 2017-11-24 NOTE — Therapy (Signed)
[facility] [address] [place], [place], [address] Phone: [phone]   Fax:  [phone]  Physical Therapy Treatment  Patient Details  Name: [name] MRN: [id number] Date of Birth: Mar 09, 1946 Referring Provider: Marcial Pacas   Encounter Date: 11/24/2017  PT End of Session - 11/24/17 1930    Visit Number  15    Number of Visits  22 per recert 01/30/45    Date for PT Re-Evaluation  01/23/18    Authorization Type  Medicare and Federal BCBS    Authorization Time Period  Medicare cert:  2/86/38-1/77/11    PT Start Time  0848    PT Stop Time  0930    PT Time Calculation (min)  42 min    Equipment Utilized During Treatment  Gait belt    Activity Tolerance  Patient tolerated treatment well    Behavior During Therapy  [facility] for tasks assessed/performed       Past Medical History:  Diagnosis Date  . Anxiety   . Arthritis   . Asthma    as a child  . Bipolar 1 disorder ([place])   . Cancer ([place])    hx breast cancer  . Depression   . Diabetes mellitus without complication ([place])    type 2  . Foot drop   . History of breast cancer 1995   lumpectomy and radiation left breast  . History of duodenal ulcer 1989  . Hyperlipidemia   . Hypertension   . Stress incontinence     Past Surgical History:  Procedure Laterality Date  . BREAST LUMPECTOMY  1995   left breast / radiation  . DILATION AND CURETTAGE OF UTERUS  1989  . JOINT REPLACEMENT    . KNEE ARTHROSCOPY     rt knee  . TONSILLECTOMY    . TOTAL KNEE ARTHROPLASTY Right 07/07/2014   Procedure: RIGHT TOTAL KNEE ARTHROPLASTY;  Surgeon: [name], MD;  Location: [facility];  Service: Orthopedics;  Laterality: Right;  . TOTAL KNEE ARTHROPLASTY Left 07/06/2015   Procedure: LEFT TOTAL KNEE ARTHROPLASTY;  Surgeon: Gaynelle Arabian, MD;  Location: [facility];  Service: Orthopedics;  Laterality: Left;    There were no vitals filed for this visit.  Subjective Assessment -  11/24/17 0854    Subjective  Slept all day with the new medication, so I didn't take it today.  Almost fell yesterday in laundry basket, but I caught myself.    Patient is accompained by:  Family member    Pertinent History  L TKR 06/2015, R TKR 2016    Patient Stated Goals  Pt's goals for therapy are to not fall anymore.    Currently in Pain?  No/denies                       [facility] Adult PT Treatment/Exercise - 11/24/17 0001      Ambulation/Gait   Ambulation/Gait  Yes    Ambulation/Gait Assistance  4: Min guard;5: Supervision    Ambulation/Gait Assistance Details  Cues for increased step length, relaxed arm swing    Ambulation Distance (Feet)  400 Feet then 230 ft, 100 ft    Assistive device  None    Gait Pattern  Step-through pattern;Decreased arm swing - left;Decreased step length - right;Decreased stance time - right;Right flexed knee in stance;Decreased trunk rotation;Poor foot clearance - right;Trunk flexed    Ambulation Surface  Level;Unlevel      Self-Care   Self-Care  Other Self-Care Comments    Other Self-Care Comments   Discussed POC, and progress towards goals (per last visit, pt has met 3 of 5 short term goals).  Discussed overall progress with PT and appointments.  Currently, pt has only appts scheduled through next week, pt aware, and will need to discuss plans to either d/c or continue PT after next week's sessions.          Balance Exercises - 11/24/17 0919      Balance Exercises: Standing   Stepping Strategy  Lateral;UE support;Other reps (comment) 15 reps each side-cues for large amplitude stepping    Balance Beam  at counter, 2 reps R and L sidestepping along balance beam    Sidestepping  3 reps;Upper extremity support along parallel bars    Other Standing Exercises  Large amplitude movement patterns:  PWR! Up (gentle squat to upright standing with tactile cues) x 15 reps, then rocking an reaching (PWR! Rock) x 15 reps      Pt requires UE  support, extra time and tactile cues.  PT Education - 11/24/17 1929    Education provided  Yes    Education Details  POC, progress towards PT goals    Person(s) Educated  Patient    Methods  Explanation    Comprehension  Verbalized understanding       PT Short Term Goals - 11/22/17 0827      PT SHORT TERM GOAL #1   Title  Pt will perform HEP with husband's supervision for improved functional mobility, balance, gait.  TARGET 09/15/17    Time  --    Period  --    Status  Achieved      PT SHORT TERM GOAL #2   Title  Pt will improve 5x sit<>stand in less than or equal to 15 seconds to demonstrate improved functional strength.  GOAL ONGOING, TARGET 11/24/17    Baseline  11/22/2017 Met today 14.85 sec with UE support.    Time  --    Period  --    Status  Achieved      PT SHORT TERM GOAL #3   Title  Pt will improve TUG score to less than or equal to 13.5 seconds for decreased fall risk.  GOAL ONGOING, TARGET 11/24/17    Baseline  11/22/2017 Not met 17.69 sec. which is slower than initial of 17.22 sec.     Time  --    Period  --    Status  Not Met      PT SHORT TERM GOAL #4   Title  Pt/husband will report 50% improvement in bed mobility/car transfer tasks for improved functional mobility.    Baseline  11/22/17: per patient report (spouse not present to confirm)    Time  --    Period  --    Status  Achieved      PT SHORT TERM GOAL #5   Title  Pt will ambulate outdoor surfaces, 300-500 ft, with supervision, with least restrictive assistive device for improved community mobility.    Baseline  11/22/2017 Partially met today. Pt. can ambulate full distance but requires min guard at times due to slight veering and leaning on SPTA.     Time  --    Period  --    Status  Partially Met        PT Long Term Goals - 10/26/17 2113      PT LONG TERM GOAL #1   Title  Pt will verbalize  understanding of fall prevention in home environment.  TARGET 12/21/17    Time  8    Period  Weeks    Status   On-going    Target Date  12/21/17      PT LONG TERM GOAL #2   Title  Pt will perform 5x sit<>stand in less than or equal to 13 seconds for improved functional lower extremity strength.    Time  8    Period  Weeks    Status  On-going    Target Date  12/21/17      PT LONG TERM GOAL #3   Title  Pt will improve Dynamic Gait index score to at least 15/24 for decreased fall risk.    Time  8    Status  On-going    Target Date  12/21/17      PT LONG TERM GOAL #4   Title  Pt will improve gait velocity to at least 2.62 ft/sec for improved gait efficiency and safety.    Time  8    Period  Weeks    Status  On-going    Target Date  12/21/17            Plan - 11/24/17 1930    Clinical Impression Statement  Pt's gait pattern appears to remain somewhat unchanged, still needing cues for improved ampltiude and generation of power with gait mechanics.  Pt performed balance activities for posture, large amplitude reaching and stepping at counter, but needs repeated cues to maintain large movement patterns.  Pt will continue to benefit from skilled PT to work towards [place].    Rehab Potential  Good    Clinical Impairments Affecting Rehab Potential  memory loss/supportive spouse; has had therapy in the past    PT Frequency  2x / week    PT Duration  8 weeks per recert 6/76/19    PT Treatment/Interventions  ADLs/Self Care Home Management;DME Instruction;Balance training;Therapeutic exercise;Therapeutic activities;Functional mobility training;Gait training;Neuromuscular re-education;Patient/family education    PT Next Visit Plan  Pt's last appts are scheduled next week; if pt agreeable to continuing POC towards LTGs, additional appts need to be made; otherwise, check LTGs, discuss continued fitness plans, review balance HEP    Consulted and Agree with Plan of Care  Patient       Patient will benefit from skilled therapeutic intervention in order to improve the following deficits and impairments:   Abnormal gait, Decreased balance, Decreased mobility, Difficulty walking, Decreased strength, Postural dysfunction  Visit Diagnosis: Unsteadiness on feet  Other abnormalities of gait and mobility     Problem List Patient Active Problem List   Diagnosis Date Noted  . Gait abnormality 08/15/2017  . Memory loss 08/15/2017  . Chronic low back pain 07/01/2016  . Abnormality of gait 05/12/2016  . Paresthesia 05/12/2016  . OA (osteoarthritis) of knee 07/07/2014    Ronnisha Felber W. 11/24/2017, 7:34 PM  Frazier Butt., PT   [facility] [address] [place], [place], [address] Phone: [phone]   Fax:  [phone]  Name: [name] MRN: [id number] Date of Birth: [birth year]

## 2017-11-27 ENCOUNTER — Ambulatory Visit: Payer: Medicare Other | Admitting: Physical Therapy

## 2017-11-27 DIAGNOSIS — R2689 Other abnormalities of gait and mobility: Secondary | ICD-10-CM

## 2017-11-27 DIAGNOSIS — M6281 Muscle weakness (generalized): Secondary | ICD-10-CM

## 2017-11-27 DIAGNOSIS — R2681 Unsteadiness on feet: Secondary | ICD-10-CM

## 2017-11-27 NOTE — Therapy (Signed)
[facility] [address] [place], [place], [address] Phone: [phone]   Fax:  [phone]  Physical Therapy Treatment  Patient Details  Name: [name] MRN: [id number] Date of Birth: 29-Dec-1945 Referring Provider: Marcial Pacas   Encounter Date: 11/27/2017  PT End of Session - 11/27/17 1100    Visit Number  15    Number of Visits  22 per recert 0/86/76    Date for PT Re-Evaluation  01/23/18    Authorization Type  Medicare and Federal BCBS    Authorization Time Period  Medicare cert:  1/95/09-08/01/69    PT Start Time  1022    PT Stop Time  1100    PT Time Calculation (min)  38 min    Equipment Utilized During Treatment  Gait belt    Activity Tolerance  Patient tolerated treatment well    Behavior During Therapy  WFL for tasks assessed/performed       Past Medical History:  Diagnosis Date  . Anxiety   . Arthritis   . Asthma    as a child  . Bipolar 1 disorder ([place])   . Cancer ([place])    hx breast cancer  . Depression   . Diabetes mellitus without complication ([place])    type 2  . Foot drop   . History of breast cancer 1995   lumpectomy and radiation left breast  . History of duodenal ulcer 1989  . Hyperlipidemia   . Hypertension   . Stress incontinence     Past Surgical History:  Procedure Laterality Date  . BREAST LUMPECTOMY  1995   left breast / radiation  . DILATION AND CURETTAGE OF UTERUS  1989  . JOINT REPLACEMENT    . KNEE ARTHROSCOPY     rt knee  . TONSILLECTOMY    . TOTAL KNEE ARTHROPLASTY Right 07/07/2014   Procedure: RIGHT TOTAL KNEE ARTHROPLASTY;  Surgeon: [name], MD;  Location: [facility];  Service: Orthopedics;  Laterality: Right;  . TOTAL KNEE ARTHROPLASTY Left 07/06/2015   Procedure: LEFT TOTAL KNEE ARTHROPLASTY;  Surgeon: Gaynelle Arabian, MD;  Location: [facility];  Service: Orthopedics;  Laterality: Left;    There were no vitals filed for this visit.  Subjective Assessment -  11/27/17 1025    Subjective  Pt plans to discharge this week due to going on a trip for all next month.  Pt does not do HEP at home because she is to busy.  Pt does go on the stationary bike 30 min multiple times a week,    Patient is accompained by:  Family member    Pertinent History  L TKR 06/2015, R TKR 2016    Patient Stated Goals  Pt's goals for therapy are to not fall anymore.    Currently in Pain?  No/denies                       [facility] Adult PT Treatment/Exercise - 11/27/17 0001      Ambulation/Gait   Ambulation/Gait  Yes    Ambulation/Gait Assistance  5: Supervision    Ambulation/Gait Assistance Details   cues for posture and safety technique with Rollator    Ambulation Distance (Feet)  500 Feet    Assistive device  4-wheeled walker    Gait Pattern  Step-through pattern;Decreased arm swing - left;Decreased step length - right;Decreased stance time - right;Right flexed knee in stance;Decreased trunk rotation;Poor foot clearance - right;Trunk flexed  Ambulation Surface  Level;Unlevel;Indoor;Outdoor;Paved          Balance Exercises - 11/27/17 1227      Balance Exercises: Standing   SLS  Eyes open multidirectional tapping cones; Min A    Stepping Strategy  Anterior;Posterior;Lateral CGA        PT Education - 11/27/17 1223    Education provided  Yes    Education Details  Pt's fall risk and rollator being a good option to decrease fall risk.   D/C plan.    Person(s) Educated  Patient    Methods  Explanation;Demonstration;Verbal cues    Comprehension  Verbalized understanding;Returned demonstration;Verbal cues required;Need further instruction       PT Short Term Goals - 11/22/17 0827      PT SHORT TERM GOAL #1   Title  Pt will perform HEP with husband's supervision for improved functional mobility, balance, gait.  TARGET 09/15/17    Time  --    Period  --    Status  Achieved      PT SHORT TERM GOAL #2   Title  Pt will improve 5x sit<>stand in less  than or equal to 15 seconds to demonstrate improved functional strength.  GOAL ONGOING, TARGET 11/24/17    Baseline  11/22/2017 Met today 14.85 sec with UE support.    Time  --    Period  --    Status  Achieved      PT SHORT TERM GOAL #3   Title  Pt will improve TUG score to less than or equal to 13.5 seconds for decreased fall risk.  GOAL ONGOING, TARGET 11/24/17    Baseline  11/22/2017 Not met 17.69 sec. which is slower than initial of 17.22 sec.     Time  --    Period  --    Status  Not Met      PT SHORT TERM GOAL #4   Title  Pt/husband will report 50% improvement in bed mobility/car transfer tasks for improved functional mobility.    Baseline  11/22/17: per patient report (spouse not present to confirm)    Time  --    Period  --    Status  Achieved      PT SHORT TERM GOAL #5   Title  Pt will ambulate outdoor surfaces, 300-500 ft, with supervision, with least restrictive assistive device for improved community mobility.    Baseline  11/22/2017 Partially met today. Pt. can ambulate full distance but requires min guard at times due to slight veering and leaning on SPTA.     Time  --    Period  --    Status  Partially Met        PT Long Term Goals - 10/26/17 2113      PT LONG TERM GOAL #1   Title  Pt will verbalize understanding of fall prevention in home environment.  TARGET 12/21/17    Time  8    Period  Weeks    Status  On-going    Target Date  12/21/17      PT LONG TERM GOAL #2   Title  Pt will perform 5x sit<>stand in less than or equal to 13 seconds for improved functional lower extremity strength.    Time  8    Period  Weeks    Status  On-going    Target Date  12/21/17      PT LONG TERM GOAL #3   Title  Pt will improve Dynamic Gait  index score to at least 15/24 for decreased fall risk.    Time  8    Status  On-going    Target Date  12/21/17      PT LONG TERM GOAL #4   Title  Pt will improve gait velocity to at least 2.62 ft/sec for improved gait efficiency and  safety.    Time  8    Period  Weeks    Status  On-going    Target Date  12/21/17            Plan - 11/27/17 1212    Clinical Impression Statement  Pt plans to discharge next visit due to taking a month long trip to [place].  Pt does not plan to take an assistive device on the trip.  Due to continued balance issue, during gait training pt trialled a rollator for indoor and outdoor (paved surfaces) gait; pt was able to ambulate 500 ft with no LOB and x1 seated rest break at supervision level.  Balance training: working on multidirectional stepping and SLS tapping cones; pt required CGA to Min A for balance without UE support.                                                            Rehab Potential  Good    Clinical Impairments Affecting Rehab Potential  memory loss/supportive spouse; has had therapy in the past    PT Frequency  2x / week    PT Duration  8 weeks per recert 5/62/56    PT Treatment/Interventions  ADLs/Self Care Home Management;DME Instruction;Balance training;Therapeutic exercise;Therapeutic activities;Functional mobility training;Gait training;Neuromuscular re-education;Patient/family education    PT Next Visit Plan  check LTGs, D/C, discuss continued fitness plans, review balance HEP    Consulted and Agree with Plan of Care  Patient       Patient will benefit from skilled therapeutic intervention in order to improve the following deficits and impairments:  Abnormal gait, Decreased balance, Decreased mobility, Difficulty walking, Decreased strength, Postural dysfunction  Visit Diagnosis: Unsteadiness on feet  Other abnormalities of gait and mobility  Muscle weakness (generalized)     Problem List Patient Active Problem List   Diagnosis Date Noted  . Gait abnormality 08/15/2017  . Memory loss 08/15/2017  . Chronic low back pain 07/01/2016  . Abnormality of gait 05/12/2016  . Paresthesia 05/12/2016  . OA (osteoarthritis) of knee 07/07/2014    Bjorn Loser,  PTA  11/27/17, 12:30 PM [facility] [address] [place], [place], [address] Phone: [phone]   Fax:  [phone]  Name: LAURIEANN FRIDDLE MRN: [id number] Date of Birth: 1945-05-12

## 2017-11-30 ENCOUNTER — Ambulatory Visit: Payer: Medicare Other | Admitting: Physical Therapy

## 2017-11-30 ENCOUNTER — Telehealth: Payer: Self-pay | Admitting: Physical Therapy

## 2017-11-30 DIAGNOSIS — R2681 Unsteadiness on feet: Secondary | ICD-10-CM

## 2017-11-30 DIAGNOSIS — R2689 Other abnormalities of gait and mobility: Secondary | ICD-10-CM | POA: Diagnosis not present

## 2017-11-30 DIAGNOSIS — M6281 Muscle weakness (generalized): Secondary | ICD-10-CM | POA: Diagnosis not present

## 2017-11-30 NOTE — Telephone Encounter (Signed)
Dr. Krista Blue, I discharged [name] from physical therapy today, with the recommendation to use a rollator walker for improved safety and decreased risk of falls with gait.  If you agree, could you please write an order in Epic for rollator (4-wheeled) walker?  I just spoke with Salle's husband and he requested order for the walker.  Thank you.  Mady Haagensen, PT 11/30/17 3:14 PM Phone: [phone] Fax: [phone]

## 2017-11-30 NOTE — Therapy (Signed)
[facility] [address] [place], [place], [address] Phone: [phone]   Fax:  [phone]  Physical Therapy Treatment  Patient Details  Name: ADLER ALTON MRN: [id number] Date of Birth: 1945-07-26 Referring Provider: Marcial Pacas   Encounter Date: 11/30/2017  PT End of Session - 11/30/17 1019    Visit Number  17 previous visit should have been 16    Number of Visits  22 per recert 2/58/52    Date for PT Re-Evaluation  01/23/18    Authorization Type  Medicare and Federal BCBS    Authorization Time Period  Medicare cert:  7/78/24-2/35/36    PT Start Time  0930    PT Stop Time  1013 Pt uses restroom during session    PT Time Calculation (min)  43 min    Activity Tolerance  Patient tolerated treatment well    Behavior During Therapy  [facility] for tasks assessed/performed       Past Medical History:  Diagnosis Date  . Anxiety   . Arthritis   . Asthma    as a child  . Bipolar 1 disorder ([place])   . Cancer ([place])    hx breast cancer  . Depression   . Diabetes mellitus without complication ([place])    type 2  . Foot drop   . History of breast cancer 1995   lumpectomy and radiation left breast  . History of duodenal ulcer 1989  . Hyperlipidemia   . Hypertension   . Stress incontinence     Past Surgical History:  Procedure Laterality Date  . BREAST LUMPECTOMY  1995   left breast / radiation  . DILATION AND CURETTAGE OF UTERUS  1989  . JOINT REPLACEMENT    . KNEE ARTHROSCOPY     rt knee  . TONSILLECTOMY    . TOTAL KNEE ARTHROPLASTY Right 07/07/2014   Procedure: RIGHT TOTAL KNEE ARTHROPLASTY;  Surgeon: [name], MD;  Location: [facility];  Service: Orthopedics;  Laterality: Right;  . TOTAL KNEE ARTHROPLASTY Left 07/06/2015   Procedure: LEFT TOTAL KNEE ARTHROPLASTY;  Surgeon: Gaynelle Arabian, MD;  Location: [facility];  Service: Orthopedics;  Laterality: Left;    There were no vitals filed for this  visit.                    [facility] Adult PT Treatment/Exercise - 11/30/17 0001      Transfers   Transfers  Sit to Stand;Stand to Sit    Sit to Stand  6: Modified independent (Device/Increase time);Without upper extremity assist;From chair/3-in-1    Five time sit to stand comments   19.91 pt has uncontrolled descent each time, even with cues    Stand to Sit  6: Modified independent (Device/Increase time);Without upper extremity assist;To chair/3-in-1      Ambulation/Gait   Ambulation/Gait  Yes    Ambulation/Gait Assistance  5: Supervision    Ambulation/Gait Assistance Details  Cues for posture and keep the rollator close to base of support, to avoid forward lean on rollator    Ambulation Distance (Feet)  350 Feet 100 ft x 2; 400 ft outdoors    Assistive device  4-wheeled walker    Gait Pattern  Step-through pattern;Decreased arm swing - left;Decreased step length - right;Decreased stance time - right;Right flexed knee in stance;Decreased trunk rotation;Poor foot clearance - right;Trunk flexed    Ambulation Surface  Level;Indoor;Outdoor    Gait velocity  20.35 sec = 1.61 ft/sec (rollator); no device:  16.88 sec (1.94 ft/sec)    Curb  4: Min assist    Curb Details (indicate cue type and reason)  Cues for technique for curb negotiation      Self-Care   Self-Care  Other Self-Care Comments    Other Self-Care Comments   Husband present at beginning of session with questions regarding rollator walker use, especially for upcoming trip to [place].  Discussed pt's risk of falls, walking pattern, history of falls and that she would likely benefit from use of rollator for improved stability, improved confidence and safety with gait.  Also, discussed fall prevention education in light of upcoming trip. Discussed POC and plan for d/c this visit.               PT Education - 11/30/17 1018    Education provided  Yes    Education Details  fall prevention, benefits of rollator;  instructed pt/husband in use of rollator; goal check and plans for d/c    Person(s) Educated  Patient;Spouse    Methods  Explanation;Demonstration;Verbal cues    Comprehension  Verbalized understanding;Returned demonstration;Verbal cues required       PT Short Term Goals - 11/22/17 0827      PT SHORT TERM GOAL #1   Title  Pt will perform HEP with husband's supervision for improved functional mobility, balance, gait.  TARGET 09/15/17    Time  --    Period  --    Status  Achieved      PT SHORT TERM GOAL #2   Title  Pt will improve 5x sit<>stand in less than or equal to 15 seconds to demonstrate improved functional strength.  GOAL ONGOING, TARGET 11/24/17    Baseline  11/22/2017 Met today 14.85 sec with UE support.    Time  --    Period  --    Status  Achieved      PT SHORT TERM GOAL #3   Title  Pt will improve TUG score to less than or equal to 13.5 seconds for decreased fall risk.  GOAL ONGOING, TARGET 11/24/17    Baseline  11/22/2017 Not met 17.69 sec. which is slower than initial of 17.22 sec.     Time  --    Period  --    Status  Not Met      PT SHORT TERM GOAL #4   Title  Pt/husband will report 50% improvement in bed mobility/car transfer tasks for improved functional mobility.    Baseline  11/22/17: per patient report (spouse not present to confirm)    Time  --    Period  --    Status  Achieved      PT SHORT TERM GOAL #5   Title  Pt will ambulate outdoor surfaces, 300-500 ft, with supervision, with least restrictive assistive device for improved community mobility.    Baseline  11/22/2017 Partially met today. Pt. can ambulate full distance but requires min guard at times due to slight veering and leaning on SPTA.     Time  --    Period  --    Status  Partially Met        PT Long Term Goals - 11/30/17 0956      PT LONG TERM GOAL #1   Title  Pt will verbalize understanding of fall prevention in home environment.  TARGET 12/21/17    Time  8    Period  Weeks    Status   Achieved      PT  LONG TERM GOAL #2   Title  Pt will perform 5x sit<>stand in less than or equal to 13 seconds for improved functional lower extremity strength.    Baseline  19.91 sec 11/30/17    Time  8    Period  Weeks    Status  Not Met      PT LONG TERM GOAL #3   Title  Pt will improve Dynamic Gait index score to at least 15/24 for decreased fall risk.    Baseline  Recommend rollator for gait for stability    Time  8    Status  Deferred      PT LONG TERM GOAL #4   Title  Pt will improve gait velocity to at least 2.62 ft/sec for improved gait efficiency and safety.    Baseline  1.94 ft/sec no device; 1.61 ft/sec rollator    Time  8    Period  Weeks    Status  Not Met            Plan - 11/30/17 1023    Clinical Impression Statement  Assessed LTGs this visit for discharge, with pt meeting LTG 1 for fall prevention education; LTG 2 and 4 not met (gait velocity and 5x sit<>stand scores actually have slowed since last measures and since eval); LTG 3 deferred for DGI, as PT recommends use of rollator for improved stability and safety with gait.  Pt's husband present at beginning of session for education in benefits of use of rollator walker, fall prevention, POC/discharge from PT this visit.    Rehab Potential  Good    Clinical Impairments Affecting Rehab Potential  memory loss/supportive spouse; has had therapy in the past    PT Frequency  2x / week    PT Duration  8 weeks per recert 3/76/28    PT Treatment/Interventions  ADLs/Self Care Home Management;DME Instruction;Balance training;Therapeutic exercise;Therapeutic activities;Functional mobility training;Gait training;Neuromuscular re-education;Patient/family education    PT Next Visit Plan  Plan for d/c this visit.    Consulted and Agree with Plan of Care  Patient       Patient will benefit from skilled therapeutic intervention in order to improve the following deficits and impairments:  Abnormal gait, Decreased balance,  Decreased mobility, Difficulty walking, Decreased strength, Postural dysfunction  Visit Diagnosis: Unsteadiness on feet  Other abnormalities of gait and mobility     Problem List Patient Active Problem List   Diagnosis Date Noted  . Gait abnormality 08/15/2017  . Memory loss 08/15/2017  . Chronic low back pain 07/01/2016  . Abnormality of gait 05/12/2016  . Paresthesia 05/12/2016  . OA (osteoarthritis) of knee 07/07/2014    MARRIOTT,AMY W. 11/30/2017, 10:26 AM  Frazier Butt., PT   [facility] [address] [place], [place], [address] Phone: [phone]   Fax:  [phone]  Name: EVI MCCOMB MRN: [id number] Date of Birth: 1945/05/14   PHYSICAL THERAPY DISCHARGE SUMMARY  Visits from Start of Care: 17  Current functional level related to goals / functional outcomes: PT Long Term Goals - 11/30/17 0956      PT LONG TERM GOAL #1   Title  Pt will verbalize understanding of fall prevention in home environment.  TARGET 12/21/17    Time  8    Period  Weeks    Status  Achieved      PT LONG TERM GOAL #2   Title  Pt will perform 5x sit<>stand in less than or equal to  13 seconds for improved functional lower extremity strength.    Baseline  19.91 sec 11/30/17    Time  8    Period  Weeks    Status  Not Met      PT LONG TERM GOAL #3   Title  Pt will improve Dynamic Gait index score to at least 15/24 for decreased fall risk.    Baseline  Recommend rollator for gait for stability    Time  8    Status  Deferred      PT LONG TERM GOAL #4   Title  Pt will improve gait velocity to at least 2.62 ft/sec for improved gait efficiency and safety.    Baseline  1.94 ft/sec no device; 1.61 ft/sec rollator    Time  8    Period  Weeks    Status  Not Met      Pt has met 1 LTG.  Pt's gait velocity and transfer measures have slowed since PT evaluation.     Remaining deficits: Fall risk, balance   Education /  Equipment: Educated in [employer], fall prevention, use of rollator.  Plan: Patient agrees to discharge.  Patient goals were not met. Patient is being discharged due to                                                     ?????Maximizing rehab potential at this time.    Mady Haagensen, PT 11/30/17 10:28 AM Phone: [phone] Fax: [phone]

## 2017-11-30 NOTE — Addendum Note (Signed)
Addended by: Frazier Butt on: 11/30/2017 11:53 AM   Modules accepted: Orders

## 2017-12-06 ENCOUNTER — Encounter: Payer: Self-pay | Admitting: *Deleted

## 2017-12-06 ENCOUNTER — Telehealth: Payer: Self-pay | Admitting: Neurology

## 2017-12-06 NOTE — Telephone Encounter (Signed)
Pts husband [name] requesting a call. Stating they will be leaving for vacation soon and as advised from the physical therapist the pt has been using a walker. [name] would like a note from Dr. Krista Blue allowing the pt to take her walker with her during the vacation. Please call to advise

## 2017-12-06 NOTE — Telephone Encounter (Signed)
Spoke to patient's husband - they have an upcoming trip and he is concerned the airline will not allow her to check a walker.  He would like a statement from Dr. Krista Blue saying that her walker is necessary.  Letter written, signed and placed up front for pick up.

## 2018-02-13 DIAGNOSIS — Z23 Encounter for immunization: Secondary | ICD-10-CM | POA: Diagnosis not present

## 2018-02-13 DIAGNOSIS — F209 Schizophrenia, unspecified: Secondary | ICD-10-CM | POA: Diagnosis not present

## 2018-02-14 ENCOUNTER — Encounter: Payer: Self-pay | Admitting: Plastic Surgery

## 2018-02-14 ENCOUNTER — Ambulatory Visit (INDEPENDENT_AMBULATORY_CARE_PROVIDER_SITE_OTHER): Payer: Self-pay | Admitting: Plastic Surgery

## 2018-02-14 VITALS — Ht 64.0 in | Wt 164.0 lb

## 2018-02-14 DIAGNOSIS — S01311A Laceration without foreign body of right ear, initial encounter: Secondary | ICD-10-CM

## 2018-02-14 NOTE — Progress Notes (Signed)
   Subjective:    Patient ID: [name], female    DOB: 1945/09/19, 72 y.o.   MRN: [id number]  The patient is a 72 yrs old wf here with her husband for evaluation of her right ear lobe.  She has medical issue including diabetes but are stable.  She had her ears pierced many years ago and did not have any issues until recently.  The right lobe split but the left is fine.  There is no sign of infection or redness.  She wants to be able to wear earrings in the future.   Review of Systems  Constitutional: Negative.   HENT: Negative.   Eyes: Negative.   Respiratory: Negative.   Cardiovascular: Negative.   Endocrine: Negative.   Genitourinary: Negative.   Neurological: Negative.   Hematological: Negative.   Psychiatric/Behavioral: Negative.       Objective:   Physical Exam  Constitutional: She is oriented to person, place, and time. She appears well-developed and well-nourished.  HENT:  Head: Normocephalic and atraumatic.  Eyes: Pupils are equal, round, and reactive to light. EOM are normal.  Cardiovascular: Normal rate.  Pulmonary/Chest: Effort normal.  Neurological: She is alert and oriented to person, place, and time.  Skin: Skin is warm. No erythema.  Psychiatric: She has a normal mood and affect. Her behavior is normal. Judgment and thought content normal.      Assessment & Plan:  Torn right ear lobe, initial encounter Recommend z plasty repair for the right ear lobe.

## 2018-03-07 DIAGNOSIS — N907 Vulvar cyst: Secondary | ICD-10-CM | POA: Diagnosis not present

## 2018-03-16 DIAGNOSIS — N390 Urinary tract infection, site not specified: Secondary | ICD-10-CM | POA: Diagnosis not present

## 2018-03-16 DIAGNOSIS — R309 Painful micturition, unspecified: Secondary | ICD-10-CM | POA: Diagnosis not present

## 2018-03-22 ENCOUNTER — Telehealth: Payer: Self-pay | Admitting: Neurology

## 2018-03-22 NOTE — Telephone Encounter (Signed)
Pts husband [name] requesting a call wanting to know if the testing done throughout the years have ruled out hydrocephalus, please advise

## 2018-03-22 NOTE — Telephone Encounter (Signed)
Dr. Krista Blue has reviewed the patient's chart and MRI brain.  No hydrocephalus.  Returned call to patient's husband (on [place]). He is aware and appreciated the call.

## 2018-04-11 DIAGNOSIS — L821 Other seborrheic keratosis: Secondary | ICD-10-CM | POA: Diagnosis not present

## 2018-04-11 DIAGNOSIS — L309 Dermatitis, unspecified: Secondary | ICD-10-CM | POA: Diagnosis not present

## 2018-04-11 DIAGNOSIS — D229 Melanocytic nevi, unspecified: Secondary | ICD-10-CM | POA: Diagnosis not present

## 2018-04-11 DIAGNOSIS — L814 Other melanin hyperpigmentation: Secondary | ICD-10-CM | POA: Diagnosis not present

## 2018-04-11 DIAGNOSIS — D1801 Hemangioma of skin and subcutaneous tissue: Secondary | ICD-10-CM | POA: Diagnosis not present

## 2018-04-24 DIAGNOSIS — R300 Dysuria: Secondary | ICD-10-CM | POA: Diagnosis not present

## 2018-04-24 DIAGNOSIS — N390 Urinary tract infection, site not specified: Secondary | ICD-10-CM | POA: Diagnosis not present

## 2018-05-14 DIAGNOSIS — E7800 Pure hypercholesterolemia, unspecified: Secondary | ICD-10-CM | POA: Diagnosis not present

## 2018-05-14 DIAGNOSIS — E1165 Type 2 diabetes mellitus with hyperglycemia: Secondary | ICD-10-CM | POA: Diagnosis not present

## 2018-05-14 DIAGNOSIS — R809 Proteinuria, unspecified: Secondary | ICD-10-CM | POA: Diagnosis not present

## 2018-05-15 DIAGNOSIS — F209 Schizophrenia, unspecified: Secondary | ICD-10-CM | POA: Diagnosis not present

## 2018-05-15 DIAGNOSIS — J0101 Acute recurrent maxillary sinusitis: Secondary | ICD-10-CM | POA: Diagnosis not present

## 2018-05-21 DIAGNOSIS — R809 Proteinuria, unspecified: Secondary | ICD-10-CM | POA: Diagnosis not present

## 2018-05-21 DIAGNOSIS — I10 Essential (primary) hypertension: Secondary | ICD-10-CM | POA: Diagnosis not present

## 2018-05-21 DIAGNOSIS — E7800 Pure hypercholesterolemia, unspecified: Secondary | ICD-10-CM | POA: Diagnosis not present

## 2018-05-21 DIAGNOSIS — M21371 Foot drop, right foot: Secondary | ICD-10-CM | POA: Diagnosis not present

## 2018-05-21 DIAGNOSIS — E1140 Type 2 diabetes mellitus with diabetic neuropathy, unspecified: Secondary | ICD-10-CM | POA: Diagnosis not present

## 2018-05-21 DIAGNOSIS — E1165 Type 2 diabetes mellitus with hyperglycemia: Secondary | ICD-10-CM | POA: Diagnosis not present

## 2018-05-22 DIAGNOSIS — H2513 Age-related nuclear cataract, bilateral: Secondary | ICD-10-CM | POA: Diagnosis not present

## 2018-05-22 DIAGNOSIS — H52203 Unspecified astigmatism, bilateral: Secondary | ICD-10-CM | POA: Diagnosis not present

## 2018-05-22 DIAGNOSIS — E113291 Type 2 diabetes mellitus with mild nonproliferative diabetic retinopathy without macular edema, right eye: Secondary | ICD-10-CM | POA: Diagnosis not present

## 2018-06-06 ENCOUNTER — Ambulatory Visit: Payer: Medicare Other | Admitting: Plastic Surgery

## 2018-06-11 DIAGNOSIS — R05 Cough: Secondary | ICD-10-CM | POA: Diagnosis not present

## 2018-06-26 ENCOUNTER — Encounter: Payer: Self-pay | Admitting: Plastic Surgery

## 2018-06-26 ENCOUNTER — Ambulatory Visit (INDEPENDENT_AMBULATORY_CARE_PROVIDER_SITE_OTHER): Payer: Self-pay | Admitting: Plastic Surgery

## 2018-06-26 VITALS — BP 127/74 | HR 85 | Ht 64.0 in | Wt 172.0 lb

## 2018-06-26 DIAGNOSIS — S01311A Laceration without foreign body of right ear, initial encounter: Secondary | ICD-10-CM

## 2018-06-26 NOTE — Progress Notes (Signed)
Preoperative Dx: right split ear lobe  Postoperative Dx: right split ear lobe  Procedure: repair of right split ear lobe  Anesthesia: Lidocaine 1% with 1:100,000 epinepherine  Indication for Procedure: split ear lobe  Description of Procedure: Risks and complications were explained to the patient. Consent was confirmed and signed. Time out was called and all information was confirmed to be correct. The ear lobe was prepped with betadine and drapped. A z plasty type mark was made and then lidocaine 1% with epinepherine was injected in the subcutaneous area. After waiting several minutes for the lidocaine to take affect an #15 blade was used to make the incisions which included cutting out the previous epithelialized hole. A 5-0 vicryl was used to reapproximate the deep tissue. The skin edges were reapproximated with 6-0 prolene interrupted sutures. Steri strips were applied. The patient is to follow up in one week. She tolerated the procedure well and there were no complications. Risks and complicatins were discussed and consent signed.

## 2018-07-03 ENCOUNTER — Encounter: Payer: Self-pay | Admitting: Plastic Surgery

## 2018-07-03 ENCOUNTER — Ambulatory Visit (INDEPENDENT_AMBULATORY_CARE_PROVIDER_SITE_OTHER): Payer: Self-pay | Admitting: Plastic Surgery

## 2018-07-03 VITALS — BP 119/71 | HR 81 | Temp 97.6°F | Ht 65.75 in | Wt 159.0 lb

## 2018-07-03 DIAGNOSIS — S01311A Laceration without foreign body of right ear, initial encounter: Secondary | ICD-10-CM

## 2018-07-03 NOTE — Progress Notes (Signed)
   Subjective:    Patient ID: Rica Koyanagi, female    DOB: Jul 28, 1945, 73 y.o.   MRN: [id number]  The patient is a 73 year old female here for follow-up on her right torn earlobe repair.  The area is healing nicely.  There is no sign of infection.  Sutures are in place.     Review of Systems  Constitutional: Negative.   HENT: Negative.   Respiratory: Negative.   Genitourinary: Negative.        Objective:   Physical Exam Vitals signs and nursing note reviewed.  Constitutional:      Appearance: Normal appearance.  HENT:     Head: Normocephalic.  Cardiovascular:     Rate and Rhythm: Normal rate.  Neurological:     Mental Status: She is alert.  Psychiatric:        Mood and Affect: Mood normal.        Assessment & Plan:  Torn ear lobe, right, initial encounter I will wait 1 more week prior to removing the sutures as it does not appear ready yet but it is healing nicely and there is no sign of infection.  Follow-up in 1 week.

## 2018-07-10 ENCOUNTER — Encounter: Payer: Self-pay | Admitting: Plastic Surgery

## 2018-07-10 ENCOUNTER — Ambulatory Visit (INDEPENDENT_AMBULATORY_CARE_PROVIDER_SITE_OTHER): Payer: Medicare Other | Admitting: Plastic Surgery

## 2018-07-10 VITALS — BP 122/76 | HR 73 | Temp 97.8°F | Ht 65.75 in | Wt 159.0 lb

## 2018-07-10 DIAGNOSIS — S01311A Laceration without foreign body of right ear, initial encounter: Secondary | ICD-10-CM

## 2018-07-10 NOTE — Progress Notes (Signed)
The patient is a 73 yrs old female here for follow-up on her right torn earlobe repair.  The area is healing very nicely.  There is no sign of infection.  Incision is intact.  Sutures were removed.  Dermabond was applied.  Recommend piercing in 3 months.

## 2018-07-12 ENCOUNTER — Telehealth: Payer: Self-pay

## 2018-07-12 NOTE — Telephone Encounter (Signed)
Call to pt- no answer- left v/m requesting call back 

## 2018-08-07 ENCOUNTER — Ambulatory Visit: Payer: Medicare Other | Admitting: Plastic Surgery

## 2018-08-09 DIAGNOSIS — Z6827 Body mass index (BMI) 27.0-27.9, adult: Secondary | ICD-10-CM | POA: Diagnosis not present

## 2018-08-09 DIAGNOSIS — Z01419 Encounter for gynecological examination (general) (routine) without abnormal findings: Secondary | ICD-10-CM | POA: Diagnosis not present

## 2018-08-09 DIAGNOSIS — Z1231 Encounter for screening mammogram for malignant neoplasm of breast: Secondary | ICD-10-CM | POA: Diagnosis not present

## 2018-08-09 DIAGNOSIS — N958 Other specified menopausal and perimenopausal disorders: Secondary | ICD-10-CM | POA: Diagnosis not present

## 2018-08-10 ENCOUNTER — Ambulatory Visit: Payer: Medicare Other | Admitting: Plastic Surgery

## 2018-08-15 DIAGNOSIS — F209 Schizophrenia, unspecified: Secondary | ICD-10-CM | POA: Diagnosis not present

## 2018-08-24 ENCOUNTER — Ambulatory Visit (INDEPENDENT_AMBULATORY_CARE_PROVIDER_SITE_OTHER): Payer: Self-pay | Admitting: Plastic Surgery

## 2018-08-24 ENCOUNTER — Other Ambulatory Visit: Payer: Self-pay

## 2018-08-24 ENCOUNTER — Encounter: Payer: Self-pay | Admitting: Plastic Surgery

## 2018-08-24 VITALS — BP 127/76 | HR 81 | Temp 98.7°F

## 2018-08-24 DIAGNOSIS — S01311A Laceration without foreign body of right ear, initial encounter: Secondary | ICD-10-CM

## 2018-08-24 NOTE — Progress Notes (Signed)
Preoperative Dx: torn ear lobe  Postoperative Dx: Same  Procedure: piercing of bilateral ear lobes   Description of Procedure: Risks and complications were explained to the patient.  Consent was confirmed.  Time out was called and all information was confirmed to be correct.  The area was prepped with chlorhexidine.  The area was marked and the patient agreed for its location on the right earlobe.  Ear piercing on the left was utilized.  The ear piercing device was utilized to pierce both ears.  The patient tolerated it well.

## 2018-09-10 DIAGNOSIS — E7800 Pure hypercholesterolemia, unspecified: Secondary | ICD-10-CM | POA: Diagnosis not present

## 2018-09-10 DIAGNOSIS — E1165 Type 2 diabetes mellitus with hyperglycemia: Secondary | ICD-10-CM | POA: Diagnosis not present

## 2018-09-17 DIAGNOSIS — M21371 Foot drop, right foot: Secondary | ICD-10-CM | POA: Diagnosis not present

## 2018-09-17 DIAGNOSIS — R809 Proteinuria, unspecified: Secondary | ICD-10-CM | POA: Diagnosis not present

## 2018-09-17 DIAGNOSIS — I10 Essential (primary) hypertension: Secondary | ICD-10-CM | POA: Diagnosis not present

## 2018-09-17 DIAGNOSIS — E7800 Pure hypercholesterolemia, unspecified: Secondary | ICD-10-CM | POA: Diagnosis not present

## 2018-09-17 DIAGNOSIS — E1165 Type 2 diabetes mellitus with hyperglycemia: Secondary | ICD-10-CM | POA: Diagnosis not present

## 2018-09-17 DIAGNOSIS — E1140 Type 2 diabetes mellitus with diabetic neuropathy, unspecified: Secondary | ICD-10-CM | POA: Diagnosis not present

## 2018-11-14 DIAGNOSIS — F209 Schizophrenia, unspecified: Secondary | ICD-10-CM | POA: Diagnosis not present

## 2019-01-07 DIAGNOSIS — N907 Vulvar cyst: Secondary | ICD-10-CM | POA: Diagnosis not present

## 2019-01-07 DIAGNOSIS — N950 Postmenopausal bleeding: Secondary | ICD-10-CM | POA: Diagnosis not present

## 2019-01-07 DIAGNOSIS — N760 Acute vaginitis: Secondary | ICD-10-CM | POA: Diagnosis not present

## 2019-01-16 DIAGNOSIS — N950 Postmenopausal bleeding: Secondary | ICD-10-CM | POA: Diagnosis not present

## 2019-01-23 DIAGNOSIS — Z23 Encounter for immunization: Secondary | ICD-10-CM | POA: Diagnosis not present

## 2019-01-30 NOTE — H&P (Addendum)
[name] is an 73 y.o. female with PMB presents for surgical evaluation and mngt.  Unable to perform endometrial biopsy in the office   Menstrual History: No LMP recorded. Patient is postmenopausal.    Past Medical History:  Diagnosis Date  . Anxiety   . Arthritis   . Asthma    as a child  . Bipolar 1 disorder ([place])   . Cancer ([place])    hx breast cancer  . Depression   . Diabetes mellitus without complication ([place])    type 2  . Foot drop   . History of breast cancer 1995   lumpectomy and radiation left breast  . History of duodenal ulcer 1989  . Hyperlipidemia   . Hypertension   . Stress incontinence     Past Surgical History:  Procedure Laterality Date  . BREAST LUMPECTOMY  1995   left breast / radiation  . DILATION AND CURETTAGE OF UTERUS  1989  . JOINT REPLACEMENT    . KNEE ARTHROSCOPY     rt knee  . TONSILLECTOMY    . TOTAL KNEE ARTHROPLASTY Right 07/07/2014   Procedure: RIGHT TOTAL KNEE ARTHROPLASTY;  Surgeon: [name], MD;  [facility];  Service: Orthopedics;  Laterality: Right;  . TOTAL KNEE ARTHROPLASTY Left 07/06/2015   Procedure: LEFT TOTAL KNEE ARTHROPLASTY;  Surgeon: Gaynelle Arabian, MD;  Location: [facility];  Service: Orthopedics;  Laterality: Left;    Family History  Problem Relation Age of Onset  . Schizophrenia Mother   . Prostate cancer Father   . Aneurysm Father        stomach  . Diabetes Father   . Colitis Paternal Grandmother     Social History:  reports that she has never smoked. She has never used smokeless tobacco. She reports current alcohol use. She reports that she does not use drugs.  Allergies:  Allergies  Allergen Reactions  . Ciprofloxacin Hcl Rash  . Latex Rash  . Penicillins Rash    Has patient had a PCN reaction causing immediate rash, facial/tongue/throat swelling, SOB or lightheadedness with hypotension: Yes Has patient had a PCN reaction causing severe rash involving mucus membranes or skin necrosis: No Has  patient had a PCN reaction that required hospitalization No Has patient had a PCN reaction occurring within the last 10 years: Yes If all of the above answers are "NO", then may proceed with Cephalosporin use.  . Sulfa Antibiotics Rash    No medications prior to admission.    ROS  Physical Exam  Gen - NAD CV - RRR Lungs - clear bilaterally Abd - soft, NT   PV [place]:  Normal uterus and adnexa.  Fluid in endometrial cavity.  EMS 1.18mm  Assessment/Plan:  PMB Hysteroscopy, D&C R/b/a discussed, questions answered, informed consent  [name] 01/30/2019, 8:18 AM

## 2019-02-01 ENCOUNTER — Other Ambulatory Visit: Payer: Self-pay

## 2019-02-01 ENCOUNTER — Encounter (HOSPITAL_COMMUNITY)
Admission: RE | Admit: 2019-02-01 | Discharge: 2019-02-01 | Disposition: A | Discharge status: Home / Self Care | Payer: Medicare Other | Source: Ambulatory Visit | Attending: Obstetrics and Gynecology | Admitting: Obstetrics and Gynecology

## 2019-02-01 DIAGNOSIS — R9431 Abnormal electrocardiogram [ECG] [EKG]: Secondary | ICD-10-CM | POA: Diagnosis not present

## 2019-02-01 DIAGNOSIS — Z01818 Encounter for other preprocedural examination: Secondary | ICD-10-CM | POA: Diagnosis not present

## 2019-02-01 DIAGNOSIS — N939 Abnormal uterine and vaginal bleeding, unspecified: Secondary | ICD-10-CM | POA: Diagnosis not present

## 2019-02-01 DIAGNOSIS — N950 Postmenopausal bleeding: Secondary | ICD-10-CM | POA: Diagnosis not present

## 2019-02-01 LAB — BASIC METABOLIC PANEL
Anion gap: 10 (ref 5–15)
BUN: 20 mg/dL (ref 8–23)
CO2: 25 mmol/L (ref 22–32)
Calcium: 9.6 mg/dL (ref 8.9–10.3)
Chloride: 100 mmol/L (ref 98–111)
Creatinine, Ser: 0.73 mg/dL (ref 0.44–1.00)
GFR calc Af Amer: >60 mL/min (ref >60)
GFR calc non Af Amer: >60 mL/min (ref >60)
Glucose, Bld: 98 mg/dL (ref 70–99)
Potassium: 4.8 mmol/L (ref 3.5–5.1)
Sodium: 135 mmol/L (ref 135–145)

## 2019-02-01 LAB — CBC
HCT: 36.8 % (ref 36.0–46.0)
Hemoglobin: 12.1 g/dL (ref 12.0–15.0)
MCH: 29 pg (ref 26.0–34.0)
MCHC: 32.9 g/dL (ref 30.0–36.0)
MCV: 88.2 fL (ref 80.0–100.0)
Platelets: 298 10*3/uL (ref 150–400)
RBC: 4.17 MIL/uL (ref 3.87–5.11)
RDW: 13.5 % (ref 11.5–15.5)
WBC: 4.9 10*3/uL (ref 4.0–10.5)
nRBC: 0 % (ref 0.0–0.2)

## 2019-02-02 ENCOUNTER — Other Ambulatory Visit (HOSPITAL_COMMUNITY)
Admission: RE | Admit: 2019-02-02 | Discharge: 2019-02-02 | Disposition: A | Discharge status: Home / Self Care | Payer: Medicare Other | Source: Ambulatory Visit | Attending: Obstetrics and Gynecology | Admitting: Obstetrics and Gynecology

## 2019-02-02 DIAGNOSIS — Z20828 Contact with and (suspected) exposure to other viral communicable diseases: Secondary | ICD-10-CM | POA: Insufficient documentation

## 2019-02-02 DIAGNOSIS — Z01812 Encounter for preprocedural laboratory examination: Secondary | ICD-10-CM | POA: Diagnosis not present

## 2019-02-03 LAB — NOVEL CORONAVIRUS, NAA (HOSP ORDER, SEND-OUT TO REF LAB; TAT 18-24 HRS): SARS-CoV-2, NAA: NOT DETECTED

## 2019-02-04 ENCOUNTER — Other Ambulatory Visit: Payer: Self-pay

## 2019-02-04 ENCOUNTER — Encounter (HOSPITAL_BASED_OUTPATIENT_CLINIC_OR_DEPARTMENT_OTHER): Payer: Self-pay | Admitting: *Deleted

## 2019-02-04 NOTE — Progress Notes (Signed)
Spoke w/ via phone for pre-op interview---  PT  Lab results--- 02-01-2019 cbc, bmp, t&s, ekg (in chart and epic)  COVID test ------ 02-02-2019 Arrive at ----  0530 NPO after --- mn Medications to take morning of surgery -- None Diabetic medication ----- as usual day before surgery and no diabetic meds morning of surgery Patient Special Instructions ----- no Pre-Op special Istructions ----- bipolar Patient verbalized understanding of instructions that were given at this phone interview. Patient denies shortness of breath, chest pain, fever, cough a this phone interview.  Anesthesia Review:  Hx htn, dm2 w/ insulin, bipolar  PCP:  Dr Butch Penny [place] Endocrinologist--- dr Chalmers Cater Cardiologist :  no Chest x-ray :  no EKG :  02-01-2019 epic Echo :  no Cardiac Cath :   no Sleep Study/ CPAP :  no Fasting Blood Sugar :  107-140    / Checks Blood Sugar -- times a day:   4  Blood Thinner/ Instructions /Last Dose:  no ASA / Instructions/ Last Dose :  no

## 2019-02-05 NOTE — Anesthesia Preprocedure Evaluation (Addendum)
Anesthesia Evaluation  Patient identified by MRN, date of birth, ID band Patient awake    Reviewed: Allergy & Precautions, NPO status , Patient's Chart, lab work & pertinent test results  History of Anesthesia Complications Negative for: history of anesthetic complications  Airway Mallampati: I  TM Distance: >3 FB Neck ROM: Full    Dental  (+) Teeth Intact, Dental Advisory Given   Pulmonary neg pulmonary ROS,    Pulmonary exam normal        Cardiovascular Exercise Tolerance: Good hypertension, Pt. on medications (-) angina(-) DOE Normal cardiovascular exam Rhythm:Regular Rate:Normal     Neuro/Psych PSYCHIATRIC DISORDERS Anxiety Depression Bipolar Disorder negative neurological ROS     GI/Hepatic Neg liver ROS, PUD,   Endo/Other  diabetes, Type 2, Insulin Dependent, Oral Hypoglycemic Agents  Renal/GU negative Renal ROS  negative genitourinary   Musculoskeletal negative musculoskeletal ROS (+)   Abdominal   Peds  Hematology negative hematology ROS (+)   Anesthesia Other Findings   Reproductive/Obstetrics                           Anesthesia Physical Anesthesia Plan  ASA: III  Anesthesia Plan: General   Post-op Pain Management:    Induction: Intravenous  PONV Risk Score and Plan: 4 or greater and Ondansetron, Dexamethasone, Treatment may vary due to age or medical condition and Midazolam  Airway Management Planned: LMA  Additional Equipment: None  Intra-op Plan:   Post-operative Plan: Extubation in OR  Informed Consent: I have reviewed the patients History and Physical, chart, labs and discussed the procedure including the risks, benefits and alternatives for the proposed anesthesia with the patient or authorized representative who has indicated his/her understanding and acceptance.     Dental advisory given  Plan Discussed with:   Anesthesia Plan Comments:         Anesthesia Quick Evaluation

## 2019-02-06 ENCOUNTER — Ambulatory Visit (HOSPITAL_BASED_OUTPATIENT_CLINIC_OR_DEPARTMENT_OTHER)
Admission: RE | Admit: 2019-02-06 | Discharge: 2019-02-06 | Disposition: A | Discharge status: Home / Self Care | Payer: Medicare Other | Attending: Obstetrics and Gynecology | Admitting: Obstetrics and Gynecology

## 2019-02-06 ENCOUNTER — Ambulatory Visit (HOSPITAL_BASED_OUTPATIENT_CLINIC_OR_DEPARTMENT_OTHER): Payer: Medicare Other | Admitting: Anesthesiology

## 2019-02-06 ENCOUNTER — Other Ambulatory Visit: Payer: Self-pay

## 2019-02-06 ENCOUNTER — Encounter (HOSPITAL_BASED_OUTPATIENT_CLINIC_OR_DEPARTMENT_OTHER)
Admission: RE | Disposition: A | Discharge status: Home / Self Care | Payer: Self-pay | Source: Home / Self Care | Attending: Obstetrics and Gynecology

## 2019-02-06 ENCOUNTER — Encounter (HOSPITAL_BASED_OUTPATIENT_CLINIC_OR_DEPARTMENT_OTHER): Payer: Self-pay | Admitting: *Deleted

## 2019-02-06 DIAGNOSIS — N950 Postmenopausal bleeding: Secondary | ICD-10-CM | POA: Diagnosis not present

## 2019-02-06 DIAGNOSIS — Z853 Personal history of malignant neoplasm of breast: Secondary | ICD-10-CM | POA: Diagnosis not present

## 2019-02-06 DIAGNOSIS — Z96653 Presence of artificial knee joint, bilateral: Secondary | ICD-10-CM | POA: Insufficient documentation

## 2019-02-06 DIAGNOSIS — F419 Anxiety disorder, unspecified: Secondary | ICD-10-CM | POA: Insufficient documentation

## 2019-02-06 DIAGNOSIS — E785 Hyperlipidemia, unspecified: Secondary | ICD-10-CM | POA: Diagnosis not present

## 2019-02-06 DIAGNOSIS — Z794 Long term (current) use of insulin: Secondary | ICD-10-CM | POA: Diagnosis not present

## 2019-02-06 DIAGNOSIS — I10 Essential (primary) hypertension: Secondary | ICD-10-CM | POA: Diagnosis not present

## 2019-02-06 DIAGNOSIS — F319 Bipolar disorder, unspecified: Secondary | ICD-10-CM | POA: Insufficient documentation

## 2019-02-06 DIAGNOSIS — Z923 Personal history of irradiation: Secondary | ICD-10-CM | POA: Insufficient documentation

## 2019-02-06 DIAGNOSIS — E119 Type 2 diabetes mellitus without complications: Secondary | ICD-10-CM | POA: Insufficient documentation

## 2019-02-06 DIAGNOSIS — N858 Other specified noninflammatory disorders of uterus: Secondary | ICD-10-CM | POA: Diagnosis not present

## 2019-02-06 HISTORY — DX: Type 2 diabetes mellitus without complications: Z79.4

## 2019-02-06 HISTORY — DX: Other cervical disc degeneration, unspecified cervical region: M50.30

## 2019-02-06 HISTORY — DX: Type 2 diabetes mellitus without complications: E11.9

## 2019-02-06 HISTORY — DX: Presence of spectacles and contact lenses: Z97.3

## 2019-02-06 HISTORY — DX: Personal history of other diseases of the respiratory system: Z87.09

## 2019-02-06 HISTORY — DX: Personal history of irradiation: Z92.3

## 2019-02-06 HISTORY — DX: Personal history of malignant neoplasm of breast: Z85.3

## 2019-02-06 HISTORY — DX: Urge incontinence: N39.41

## 2019-02-06 HISTORY — DX: Type 2 diabetes mellitus with unspecified diabetic retinopathy without macular edema: E11.319

## 2019-02-06 HISTORY — DX: Postmenopausal bleeding: N95.0

## 2019-02-06 HISTORY — PX: HYSTEROSCOPY WITH D & C: SHX1775

## 2019-02-06 HISTORY — DX: Unspecified osteoarthritis, unspecified site: M19.90

## 2019-02-06 LAB — GLUCOSE, CAPILLARY
Glucose-Capillary: 89 mg/dL (ref 70–99)
Glucose-Capillary: 73 mg/dL (ref 70–99)
Glucose-Capillary: 105 mg/dL — ABNORMAL HIGH (ref 70–99)

## 2019-02-06 LAB — TYPE AND SCREEN
ABO/RH(D): A POS
Antibody Screen: NEGATIVE

## 2019-02-06 SURGERY — DILATATION AND CURETTAGE /HYSTEROSCOPY
Anesthesia: General | Site: Vagina

## 2019-02-06 MED ORDER — FENTANYL CITRATE (PF) 100 MCG/2ML IJ SOLN
INTRAMUSCULAR | Status: AC
  Filled 2019-02-06: qty 2

## 2019-02-06 MED ORDER — FENTANYL CITRATE (PF) 100 MCG/2ML IJ SOLN
25.0000 ug | INTRAMUSCULAR | Status: DC | PRN
  Filled 2019-02-06: qty 1

## 2019-02-06 MED ORDER — ONDANSETRON HCL 4 MG/2ML IJ SOLN
INTRAMUSCULAR | Status: DC | PRN
  Administered 2019-02-06: 4 mg via INTRAVENOUS

## 2019-02-06 MED ORDER — MIDAZOLAM HCL 2 MG/2ML IJ SOLN
INTRAMUSCULAR | Status: AC
  Filled 2019-02-06: qty 2

## 2019-02-06 MED ORDER — LACTATED RINGERS IV SOLN
INTRAVENOUS | Status: DC
  Administered 2019-02-06 (×2): via INTRAVENOUS
  Filled 2019-02-06: qty 1000

## 2019-02-06 MED ORDER — OXYCODONE HCL 5 MG PO TABS
5.0000 mg | ORAL_TABLET | Freq: Once | ORAL | Status: DC | PRN
  Filled 2019-02-06: qty 1

## 2019-02-06 MED ORDER — PROPOFOL 10 MG/ML IV BOLUS
INTRAVENOUS | Status: DC | PRN
  Administered 2019-02-06: 150 mg via INTRAVENOUS

## 2019-02-06 MED ORDER — LIDOCAINE 2% (20 MG/ML) 5 ML SYRINGE
INTRAMUSCULAR | Status: AC
  Filled 2019-02-06: qty 5

## 2019-02-06 MED ORDER — SODIUM CHLORIDE 0.9 % IR SOLN
Status: DC | PRN
  Administered 2019-02-06: 3000 mL

## 2019-02-06 MED ORDER — KETOROLAC TROMETHAMINE 30 MG/ML IJ SOLN
INTRAMUSCULAR | Status: DC | PRN
  Administered 2019-02-06: 30 mg via INTRAVENOUS

## 2019-02-06 MED ORDER — ONDANSETRON HCL 4 MG/2ML IJ SOLN
INTRAMUSCULAR | Status: AC
  Filled 2019-02-06: qty 2

## 2019-02-06 MED ORDER — LACTATED RINGERS IV SOLN
INTRAVENOUS | Status: DC
  Filled 2019-02-06: qty 1000

## 2019-02-06 MED ORDER — DEXAMETHASONE SODIUM PHOSPHATE 10 MG/ML IJ SOLN
INTRAMUSCULAR | Status: DC | PRN
  Administered 2019-02-06: 5 mg via INTRAVENOUS

## 2019-02-06 MED ORDER — LIDOCAINE 2% (20 MG/ML) 5 ML SYRINGE
INTRAMUSCULAR | Status: DC | PRN
  Administered 2019-02-06: 60 mg via INTRAVENOUS

## 2019-02-06 MED ORDER — OXYCODONE HCL 5 MG/5ML PO SOLN
5.0000 mg | Freq: Once | ORAL | Status: DC | PRN
  Filled 2019-02-06: qty 5

## 2019-02-06 MED ORDER — IBUPROFEN 600 MG PO TABS: 600.0000 mg | ORAL_TABLET | Freq: Four times a day (QID) | ORAL | 0 refills | Status: AC | PRN

## 2019-02-06 MED ORDER — PROPOFOL 10 MG/ML IV BOLUS
INTRAVENOUS | Status: AC
  Filled 2019-02-06: qty 40

## 2019-02-06 MED ORDER — WHITE PETROLATUM EX OINT
TOPICAL_OINTMENT | CUTANEOUS | Status: AC
  Filled 2019-02-06: qty 5

## 2019-02-06 MED ORDER — ONDANSETRON HCL 4 MG/2ML IJ SOLN
4.0000 mg | Freq: Once | INTRAMUSCULAR | Status: DC | PRN
  Filled 2019-02-06: qty 2

## 2019-02-06 MED ORDER — FENTANYL CITRATE (PF) 100 MCG/2ML IJ SOLN
INTRAMUSCULAR | Status: DC | PRN
  Administered 2019-02-06: 50 ug via INTRAVENOUS

## 2019-02-06 MED ORDER — KETOROLAC TROMETHAMINE 30 MG/ML IJ SOLN
INTRAMUSCULAR | Status: AC
  Filled 2019-02-06: qty 1

## 2019-02-06 MED ORDER — LIDOCAINE HCL 1 % IJ SOLN
INTRAMUSCULAR | Status: DC | PRN
  Administered 2019-02-06: 1 mL

## 2019-02-06 MED ORDER — DEXAMETHASONE SODIUM PHOSPHATE 10 MG/ML IJ SOLN
INTRAMUSCULAR | Status: AC
  Filled 2019-02-06: qty 1

## 2019-02-06 MED ORDER — ARTIFICIAL TEARS OPHTHALMIC OINT
TOPICAL_OINTMENT | OPHTHALMIC | Status: AC
  Filled 2019-02-06: qty 3.5

## 2019-02-06 SURGICAL SUPPLY — 24 items
BIPOLAR CUTTING LOOP 21FR (ELECTRODE)
CANISTER SUCT 3000ML PPV (MISCELLANEOUS) ×1 IMPLANT
CATH ROBINSON RED A/P 16FR (CATHETERS) ×3 IMPLANT
CLOTH BEACON ORANGE TIMEOUT ST (SAFETY) ×4 IMPLANT
COUNTER NEEDLE 1200 MAGNETIC (NEEDLE) ×2 IMPLANT
COVER WAND RF STERILE (DRAPES) ×3 IMPLANT
CURETTE PIPELLE ENDOMTRL SUCTN (MISCELLANEOUS) IMPLANT
DILATOR CANAL MILEX (MISCELLANEOUS) IMPLANT
GAUZE 4X4 16PLY RFD (DISPOSABLE) ×3 IMPLANT
GLOVE BIO SURGEON STRL SZ 6.5 (GLOVE) ×2 IMPLANT
GLOVE BIO SURGEONS STRL SZ 6.5 (GLOVE) ×1
GLOVE BIOGEL PI IND STRL 7.0 (GLOVE) ×2 IMPLANT
GLOVE BIOGEL PI INDICATOR 7.0 (GLOVE) ×4
GOWN STRL REUS W/TWL LRG LVL3 (GOWN DISPOSABLE) ×3 IMPLANT
IV NS IRRIG 3000ML ARTHROMATIC (IV SOLUTION) ×3 IMPLANT
KIT PROCEDURE FLUENT (KITS) ×3 IMPLANT
KIT TURNOVER CYSTO (KITS) ×3 IMPLANT
LOOP CUTTING BIPOLAR 21FR (ELECTRODE) IMPLANT
PACK VAGINAL MINOR WOMEN LF (CUSTOM PROCEDURE TRAY) ×3 IMPLANT
PAD OB MATERNITY 4.3X12.25 (PERSONAL CARE ITEMS) ×3 IMPLANT
PAD PREP 24X48 CUFFED NSTRL (MISCELLANEOUS) ×3 IMPLANT
PIPELLE ENDOMETRIAL SUCTION CU (MISCELLANEOUS) ×3
TOWEL OR 17X26 10 PK STRL BLUE (TOWEL DISPOSABLE) ×4 IMPLANT
WATER STERILE IRR 500ML POUR (IV SOLUTION) ×1 IMPLANT

## 2019-02-06 NOTE — Op Note (Signed)
Pre op dx:  Postmenopausal bleeding, endometrial fluid on ultrasound  Post op dx:  same  Procedure:  Hysteroscopy, dilation and curettage  Surgeon:  [name], MD  EBL:  minimal  Specimen:  Endometrial curettings  Anesthesia:  general  Complications:  none  Condition:  stable  Pt was taken to the OR after informed consent.  Anesthesia was given and she was placed in dorsal lithotomy position.  Prepped and draped in sterile condition.  Bivalve speculum placed in vaginal and single tooth tenaculum placed on anterior lip of cervix.  Cervix was serially dilated with pratt dilators.  Diagnostic hysteroscope inserted and survey of intrauterine cavity performed.  Small amount of mucoid material noted at internal cervical os upon entering with scope.  No polyps or thickenings noted.   Hysteroscope removed and a gentle curetting performed.  Specimen  passed off to be sent to pathology.  Sponge, needle, and instrument counts correct.

## 2019-02-06 NOTE — Anesthesia Postprocedure Evaluation (Signed)
Anesthesia Post Note  Patient: [name]  Procedure(s) Performed: DILATATION AND CURETTAGE /HYSTEROSCOPY (N/A Vagina )     Patient location during evaluation: PACU Anesthesia Type: General Level of consciousness: awake and alert Pain management: pain level controlled Vital Signs Assessment: post-procedure vital signs reviewed and stable Respiratory status: spontaneous breathing, nonlabored ventilation and respiratory function stable Cardiovascular status: blood pressure returned to baseline and stable Postop Assessment: no apparent nausea or vomiting Anesthetic complications: no    Last Vitals:  Vitals:   02/06/19 0822 02/06/19 0830  BP:  129/68  Pulse: 71 (!) 57  Resp: 14 13  Temp: 36.8 C   SpO2: 100% 100%    Last Pain:  Vitals:   02/06/19 0845  TempSrc:   PainSc: 0-No pain                 Lidia Collum

## 2019-02-06 NOTE — Anesthesia Procedure Notes (Signed)
Procedure Name: LMA Insertion Date/Time: 02/06/2019 7:35 AM Performed by: Wanita Chamberlain, CRNA Pre-anesthesia Checklist: Patient identified, Emergency Drugs available, Suction available, Patient being monitored and Timeout performed Patient Re-evaluated:Patient Re-evaluated prior to induction Oxygen Delivery Method: Circle system utilized Preoxygenation: Pre-oxygenation with 100% oxygen Induction Type: IV induction Ventilation: Mask ventilation without difficulty LMA: LMA inserted LMA Size: 4.0 Number of attempts: 1 Placement Confirmation: breath sounds checked- equal and bilateral,  CO2 detector and positive ETCO2 Tube secured with: Tape Dental Injury: Teeth and Oropharynx as per pre-operative assessment

## 2019-02-06 NOTE — Discharge Instructions (Signed)
FU office 2-3 weeks for postop appointment.  Call the office [phone] for an appointment.  Personal Hygiene: Use pads not tampons x 1week You may shower, no tub baths or pools for 2-3 weeks Wipe from front to back when using restroom  Activity: Do not drive or operate any equipment for 24 hrs.   Do not rest in bed all day Walking is encouraged Walk up and down stairs slowly You may return to your normal activity in 1-2 days  Sexual Activity:  No intercourse for 2 weeks after the procedure.  Diet: Eat a light meal as desired this evening.  You may resume your usual diet tomorrow.  Return to work:  You may resume your activities after 1-2 days  What to expect:  Expect to have vaginal bleeding/discharge for 2-3 days and spotting for 10-14 days.  It is not unusual to have soreness for 1-2 weeks.  You may have a slight burning sensation when you urinate for the first few days.  You may start your menses in 2-6 weeks.  Mild cramps may continue for a couple of days.    Call your doctor:   Excessive bleeding, saturating a pad every hour Inability to urinate 6 hours after discharge Pain not relieved with pain medications Fever of 100.4 or greater   Post Anesthesia Home Care Instructions  Activity: Get plenty of rest for the remainder of the day. A responsible individual must stay with you for 24 hours following the procedure.  For the next 24 hours, DO NOT: -Drive a car -Paediatric nurse -Drink alcoholic beverages -Take any medication unless instructed by your physician -Make any legal decisions or sign important papers.  Meals: Start with liquid foods such as gelatin or soup. Progress to regular foods as tolerated. Avoid greasy, spicy, heavy foods. If nausea and/or vomiting occur, drink only clear liquids until the nausea and/or vomiting subsides. Call your physician if vomiting continues.  Special Instructions/Symptoms: Your throat may feel dry or sore from the anesthesia or the  breathing tube placed in your throat during surgery. If this causes discomfort, gargle with warm salt water. The discomfort should disappear within 24 hours.

## 2019-02-06 NOTE — Transfer of Care (Signed)
Immediate Anesthesia Transfer of Care Note  Patient: [name]  Procedure(s) Performed: DILATATION AND CURETTAGE /HYSTEROSCOPY (N/A Vagina )  Patient Location: PACU  Anesthesia Type:General  Level of Consciousness: awake, alert , oriented and patient cooperative  Airway & Oxygen Therapy: Patient Spontanous Breathing and Patient connected to nasal cannula oxygen  Post-op Assessment: Report given to RN and Post -op Vital signs reviewed and stable  Post vital signs: Reviewed and stable  Last Vitals:  Vitals Value Taken Time  BP    Temp    Pulse 64 02/06/19 0809  Resp 9 02/06/19 0809  SpO2 100 % 02/06/19 0809  Vitals shown include unvalidated device data.  Last Pain: There were no vitals filed for this visit.       Complications: No apparent anesthesia complications

## 2019-02-07 ENCOUNTER — Encounter (HOSPITAL_BASED_OUTPATIENT_CLINIC_OR_DEPARTMENT_OTHER): Payer: Self-pay | Admitting: Obstetrics and Gynecology

## 2019-02-07 LAB — SURGICAL PATHOLOGY

## 2019-02-13 DIAGNOSIS — F209 Schizophrenia, unspecified: Secondary | ICD-10-CM | POA: Diagnosis not present

## 2019-02-19 DIAGNOSIS — N950 Postmenopausal bleeding: Secondary | ICD-10-CM | POA: Diagnosis not present

## 2019-02-19 DIAGNOSIS — Z09 Encounter for follow-up examination after completed treatment for conditions other than malignant neoplasm: Secondary | ICD-10-CM | POA: Diagnosis not present

## 2019-03-13 DIAGNOSIS — R809 Proteinuria, unspecified: Secondary | ICD-10-CM | POA: Diagnosis not present

## 2019-03-13 DIAGNOSIS — E1165 Type 2 diabetes mellitus with hyperglycemia: Secondary | ICD-10-CM | POA: Diagnosis not present

## 2019-03-13 DIAGNOSIS — E7800 Pure hypercholesterolemia, unspecified: Secondary | ICD-10-CM | POA: Diagnosis not present

## 2019-03-14 DIAGNOSIS — I10 Essential (primary) hypertension: Secondary | ICD-10-CM | POA: Diagnosis not present

## 2019-03-14 DIAGNOSIS — Z7984 Long term (current) use of oral hypoglycemic drugs: Secondary | ICD-10-CM | POA: Diagnosis not present

## 2019-03-14 DIAGNOSIS — E1165 Type 2 diabetes mellitus with hyperglycemia: Secondary | ICD-10-CM | POA: Diagnosis not present

## 2019-03-14 DIAGNOSIS — F339 Major depressive disorder, recurrent, unspecified: Secondary | ICD-10-CM | POA: Diagnosis not present

## 2019-03-14 DIAGNOSIS — M8580 Other specified disorders of bone density and structure, unspecified site: Secondary | ICD-10-CM | POA: Diagnosis not present

## 2019-03-14 DIAGNOSIS — N393 Stress incontinence (female) (male): Secondary | ICD-10-CM | POA: Diagnosis not present

## 2019-03-14 DIAGNOSIS — Z0000 Encounter for general adult medical examination without abnormal findings: Secondary | ICD-10-CM | POA: Diagnosis not present

## 2019-03-19 ENCOUNTER — Other Ambulatory Visit: Payer: Self-pay | Admitting: Family Medicine

## 2019-03-19 DIAGNOSIS — M8580 Other specified disorders of bone density and structure, unspecified site: Secondary | ICD-10-CM

## 2019-03-20 DIAGNOSIS — E1165 Type 2 diabetes mellitus with hyperglycemia: Secondary | ICD-10-CM | POA: Diagnosis not present

## 2019-03-20 DIAGNOSIS — I10 Essential (primary) hypertension: Secondary | ICD-10-CM | POA: Diagnosis not present

## 2019-03-20 DIAGNOSIS — E1140 Type 2 diabetes mellitus with diabetic neuropathy, unspecified: Secondary | ICD-10-CM | POA: Diagnosis not present

## 2019-03-20 DIAGNOSIS — R809 Proteinuria, unspecified: Secondary | ICD-10-CM | POA: Diagnosis not present

## 2019-03-20 DIAGNOSIS — M21371 Foot drop, right foot: Secondary | ICD-10-CM | POA: Diagnosis not present

## 2019-03-20 DIAGNOSIS — E7800 Pure hypercholesterolemia, unspecified: Secondary | ICD-10-CM | POA: Diagnosis not present

## 2019-03-25 DIAGNOSIS — Z23 Encounter for immunization: Secondary | ICD-10-CM | POA: Diagnosis not present

## 2019-05-29 ENCOUNTER — Ambulatory Visit: Payer: Medicare Other | Attending: Internal Medicine

## 2019-05-29 DIAGNOSIS — Z23 Encounter for immunization: Secondary | ICD-10-CM

## 2019-05-29 NOTE — Progress Notes (Signed)
   Covid-19 Vaccination Clinic  Name:  [name]    MRN: [id number] DOB: [birth year]  05/29/2019  Ms. Gene was observed post Covid-19 immunization for 15 minutes without incidence. She was provided with Vaccine Information Sheet and instruction to access the V-Safe system.   [name] was instructed to call 911 with any severe reactions post vaccine: Marland Kitchen Difficulty breathing  . Swelling of your face and throat  . A fast heartbeat  . A bad rash all over your body  . Dizziness and weakness    Immunizations Administered    Name Date Dose VIS Date Route   Pfizer COVID-19 Vaccine 05/29/2019  6:51 PM 0.3 mL 04/19/2019 Intramuscular   Manufacturer: [place]   Lot: [id number]   [place]: [id number]

## 2019-06-19 ENCOUNTER — Ambulatory Visit: Payer: Medicare Other | Attending: Internal Medicine

## 2019-06-19 DIAGNOSIS — Z23 Encounter for immunization: Secondary | ICD-10-CM | POA: Insufficient documentation

## 2019-06-19 NOTE — Progress Notes (Signed)
   Covid-19 Vaccination Clinic  Name:  [name]    MRN: [id number] DOB: [birth year]  06/19/2019  [name] was observed post Covid-19 immunization for 15 minutes without incidence. She was provided with Vaccine Information Sheet and instruction to access the V-Safe system.   [name] was instructed to call 911 with any severe reactions post vaccine: Marland Kitchen Difficulty breathing  . Swelling of your face and throat  . A fast heartbeat  . A bad rash all over your body  . Dizziness and weakness    Immunizations Administered    Name Date Dose VIS Date Route   Pfizer COVID-19 Vaccine 06/19/2019 11:29 AM 0.3 mL 04/19/2019 Intramuscular   Manufacturer: [place]   Lot: [id number]   [place]: [id number]

## 2019-10-14 ENCOUNTER — Other Ambulatory Visit: Payer: Self-pay

## 2019-10-14 ENCOUNTER — Ambulatory Visit: Payer: Medicare Other | Attending: Family Medicine

## 2019-10-14 DIAGNOSIS — R2681 Unsteadiness on feet: Secondary | ICD-10-CM

## 2019-10-14 DIAGNOSIS — R2689 Other abnormalities of gait and mobility: Secondary | ICD-10-CM | POA: Diagnosis present

## 2019-10-14 DIAGNOSIS — M6281 Muscle weakness (generalized): Secondary | ICD-10-CM | POA: Diagnosis present

## 2019-10-14 NOTE — Therapy (Signed)
[facility] [address], [place], [place], [address] Phone: [phone]   Fax:  [phone]  Physical Therapy Evaluation  Patient Details  Name: [name] MRN: [id number] Date of Birth: 02-Jan-1946 Referring Provider (PT): Sela Hilding, MD   Encounter Date: 10/14/2019  PT End of Session - 10/14/19 1323    Visit Number  1    Date for PT Re-Evaluation  12/09/19    PT Start Time  1230    PT Stop Time  1311    PT Time Calculation (min)  41 min    Activity Tolerance  Patient tolerated treatment well    Behavior During Therapy  [facility] for tasks assessed/performed       Past Medical History:  Diagnosis Date  . Anxiety   . Bipolar 1 disorder ([place])    followed by leslie o'neal NP  . DDD (degenerative disc disease), cervical   . Depression   . Diabetic retinopathy ([place])    mild right eye per pt  . History of asthma    childhood  . History of duodenal ulcer 1989   remote  . History of external beam radiation therapy    1995  left breast  . History of left breast cancer    1995  s/p  breast lumpectomy and radiation therapy  . Hyperlipidemia   . Hypertension   . OA (osteoarthritis)    hands  . PMB (postmenopausal bleeding)   . Type 2 diabetes mellitus treated with insulin [facility])    endocrinologist-- dr Chalmers Cater--- fasting sugar 140 (7 day average),  checks 4-5 times daily  . Urge urinary incontinence   . Wears glasses     Past Surgical History:  Procedure Laterality Date  . BREAST LUMPECTOMY  1995   left breast / radiation  . DILATION AND CURETTAGE OF UTERUS  1989  . HYSTEROSCOPY WITH D & C N/A 02/06/2019   Procedure: DILATATION AND CURETTAGE /HYSTEROSCOPY;  Surgeon: [name], MD;  Location: [facility];  Service: Gynecology;  Laterality: N/A;  . KNEE ARTHROSCOPY Right 2015  . TONSILLECTOMY  child  . TOTAL KNEE ARTHROPLASTY Right 07/07/2014   Procedure: RIGHT TOTAL KNEE  ARTHROPLASTY;  Surgeon: [name], MD;  [facility];  Service: Orthopedics;  Laterality: Right;  . TOTAL KNEE ARTHROPLASTY Left 07/06/2015   Procedure: LEFT TOTAL KNEE ARTHROPLASTY;  Surgeon: Gaynelle Arabian, MD;  Location: [facility];  Service: Orthopedics;  Laterality: Left;    There were no vitals filed for this visit.   Subjective Assessment - 10/14/19 1234    Subjective  Pt presents to PT with complaints of balance deficits of a chronic nature.  Pt reports that she has been feeling dizzy recently approximately 1 week ago.    Pertinent History  balance deficits    Limitations  Walking;Standing    How long can you stand comfortably?  1 hour max    How long can you walk comfortably?  unsteady    Patient Stated Goals  reduce falls & dizziness    Currently in Pain?  No/denies         [facility] PT Assessment - 10/14/19 0001      Assessment   Medical Diagnosis  frequent falls    Referring Provider (PT)  Sela Hilding, MD    Onset Date/Surgical Date  --   chronic    Next MD Visit  none    Prior Therapy  for balance in 2019  Precautions   Precautions  Fall      Restrictions   Weight Bearing Restrictions  No      Balance Screen   Has the patient fallen in the past 6 months  No    Has the patient had a decrease in activity level because of a fear of falling?   No    Is the patient reluctant to leave their home because of a fear of falling?   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  Ankles 5/5      Transfers   Transfers  Sit to Stand;Stand to Sit    Sit to Stand  With upper extremity assist;4: Min guard;With armrests    Five time sit to stand comments   37.08    Stand to Sit  With upper extremity assist;4: Min guard      Ambulation/Gait   Ambulation/Gait  Yes    Gait Pattern  Step-through pattern;Decreased step length - right;Decreased step length - left;Decreased dorsiflexion - right;Decreased dorsiflexion - left;Right flexed knee in stance;Left flexed knee in stance;Trunk flexed;Wide base of support;Poor foot clearance - left;Poor foot clearance - right      Balance   Balance Assessed  Yes      Standardized Balance Assessment   Standardized Balance Assessment  Timed Up and Go Test      Timed Up and Go Test   TUG  Normal TUG    Normal TUG (seconds)  38                  Objective measurements completed on examination: See above findings.              PT Education - 10/14/19 1306    Education Details  Access Code: [email or web address]    Person(s) Educated  Patient    Methods  Explanation;Demonstration;Handout    Comprehension  Verbalized understanding;Returned demonstration       PT Short Term Goals - 10/14/19 1313      PT SHORT TERM GOAL #1   Title  be independent in initial HEP    Baseline  --    Time  4    Period  Weeks    Status  New    Target Date  11/11/19      PT SHORT TERM  GOAL #2   Title  Pt will improve 5x sit to stand in less than or equal to 25  seconds to demonstrate improved functional strength.    Baseline  --    Time  4    Period  Weeks     Status  New    Target Date  11/11/19      PT SHORT TERM GOAL #3   Title  Pt will improve TUG score to less than or equal to 25 seconds    Baseline  --    Time  4    Status  New    Target Date  11/11/19      PT SHORT TERM GOAL #4   Title  stand with erect posture > or = to 50% of the time without cueing    Baseline  --    Time  4    Period  Weeks    Status  New    Target Date  11/11/19      PT SHORT TERM GOAL #5   Title  --    Baseline  --        PT Long Term Goals - 10/14/19 1315      PT LONG TERM GOAL #1   Title  be independent in advanced HEP    Baseline  --    Time  8    Period  Weeks    Status  New    Target Date  12/09/19      PT LONG TERM GOAL #2   Title  Pt will perform 5x sit to stand in less than or equal to 19  seconds for improved functional lower extremity strength.    Baseline  --    Time  8    Period  Weeks    Status  New    Target Date  12/09/19      PT LONG TERM GOAL #3   Title  perform TUG in < or = to 20 seconds to improve safety and balance    Baseline  --    Time  8    Period  Weeks    Status  New    Target Date  12/09/19      PT LONG TERM GOAL #4   Title  perform sit to stand without UE support due to improved functional LE strength    Baseline  --    Time  8    Period  Weeks    Status  New    Target Date  12/09/19      PT LONG TERM GOAL #5   Title  --             Plan - 10/14/19 1326    Clinical Impression Statement  Pt presents to PT with chronic balance deficits and recent onset of feeling dizzy with change of position.  Pt demonstrates significant postural, gait and balance deficits.  Pt stands with flexed trunk and bil knee flexion in standing.  Pt requires close supervision and guard with sit to stand and required multiple attempts with this.  TUG and 5x sit to stand in 37-38 seconds indicates significant balance deficits.  Pt with reduced hamstring length in sitting and standing.  Pt requires extra time and cuing for  examination today.  Pt will benefit from skilled PT to address balance, gait and functional strength to improve safety.    Personal Factors and Comorbidities  Comorbidity 2    Comorbidities  chronic balance deficts, bipolar disorder, HTN    Examination-Activity Limitations  Locomotion Level;Bathing;Squat;Stairs;Stand;Transfers    Examination-Participation Restrictions  Driving;Community Activity;Cleaning;Shop    Stability/Clinical Decision Making  Evolving/Moderate complexity    Clinical Decision Making  Moderate    Rehab Potential  Good    PT Frequency  2x / week    PT Duration  8 weeks    PT Treatment/Interventions  ADLs/Self Care Home Management;Cryotherapy;Gait training;Stair training;Functional mobility training;Neuromuscular re-education;Balance training;Therapeutic exercise;Therapeutic activities;Patient/family education;Manual techniques;Passive range of motion    PT Next Visit Plan  hamstring flexibility, work on sit to stand, NuStep, review HEP    PT Home Exercise Plan  Access Code: [email or web address]    Consulted and Agree with Plan of Care  Patient       Patient will benefit from skilled therapeutic intervention in order to improve the following deficits and impairments:  Abnormal gait, Decreased activity tolerance, Decreased balance, Decreased endurance, Decreased knowledge of precautions, Decreased mobility, Decreased range of motion, Decreased strength, Impaired flexibility, Pain, Improper body mechanics, Difficulty walking  Visit Diagnosis: Unsteadiness on feet - Plan: PT plan of care cert/re-cert  Other abnormalities of gait and mobility - Plan: PT plan of care cert/re-cert  Muscle weakness (generalized) - Plan: PT plan of care cert/re-cert     Problem List Patient Active Problem List   Diagnosis Date Noted  . Torn ear lobe, right, initial encounter 02/14/2018  . Gait abnormality 08/15/2017  . Memory loss 08/15/2017  . Chronic low back pain 07/01/2016  . Abnormality of gait  05/12/2016  . Paresthesia 05/12/2016  . OA (osteoarthritis) of knee 07/07/2014     Sigurd Sos, PT 10/14/19 1:41 PM  [facility] [address], [place], [place], [address] Phone: [phone]   Fax:  [phone]  Name: [name] MRN: [id number] Date of Birth: [birth year]

## 2019-10-14 NOTE — Patient Instructions (Signed)
Access Code: [email or web address] URL: [email or web address] Date: 10/14/2019 Prepared by: Claiborne Billings  Exercises Seated Long Arc Quad - 3 x daily - 7 x weekly - 10 reps - 2 sets - 5 hold Seated March - 3 x daily - 7 x weekly - 10 reps - 3 sets Seated Heel Toe Raises - 3 x daily - 7 x weekly - 10 reps - 2 sets Correct Standing Posture - 1 x daily - 7 x weekly - 10 reps - 3 sets

## 2019-10-17 ENCOUNTER — Other Ambulatory Visit: Payer: Self-pay

## 2019-10-17 ENCOUNTER — Ambulatory Visit: Payer: Medicare Other

## 2019-10-17 DIAGNOSIS — R2681 Unsteadiness on feet: Secondary | ICD-10-CM

## 2019-10-17 DIAGNOSIS — M6281 Muscle weakness (generalized): Secondary | ICD-10-CM

## 2019-10-17 DIAGNOSIS — R2689 Other abnormalities of gait and mobility: Secondary | ICD-10-CM

## 2019-10-17 NOTE — Patient Instructions (Signed)
Access Code: [email or web address] URL: [email or web address] Date: 10/17/2019 Prepared by: Claiborne Billings   Standing Shoulder External Rotation with Resistance - 2 x daily - 7 x weekly - 2 sets - 10 reps

## 2019-10-17 NOTE — Therapy (Signed)
[facility] [address], [place], [place], [address] Phone: [phone]   Fax:  [phone]  Physical Therapy Treatment  Patient Details  Name: [name] MRN: [id number] Date of Birth: 12-31-1945 Referring Provider (PT): Sela Hilding, MD   Encounter Date: 10/17/2019   PT End of Session - 10/17/19 1310    Visit Number 2    Date for PT Re-Evaluation 12/09/19    Authorization Type Medicare BCBS    PT Start Time 1232    PT Stop Time 7253    PT Time Calculation (min) 43 min    Activity Tolerance Patient tolerated treatment well    Behavior During Therapy [facility] for tasks assessed/performed           Past Medical History:  Diagnosis Date   Anxiety    Bipolar 1 disorder ([place])    followed by Debbora Dus NP   DDD (degenerative disc disease), cervical    Depression    Diabetic retinopathy ([place])    mild right eye per pt   History of asthma    childhood   History of duodenal ulcer 1989   remote   History of external beam radiation therapy    1995  left breast   History of left breast cancer    1995  s/p  breast lumpectomy and radiation therapy   Hyperlipidemia    Hypertension    OA (osteoarthritis)    hands   PMB (postmenopausal bleeding)    Type 2 diabetes mellitus treated with insulin [facility])    endocrinologist-- dr Chalmers Cater--- fasting sugar 140 (7 day average),  checks 4-5 times daily   Urge urinary incontinence    Wears glasses     Past Surgical History:  Procedure Laterality Date   BREAST LUMPECTOMY  1995   left breast / radiation   DILATION AND CURETTAGE OF UTERUS  1989   HYSTEROSCOPY WITH D & C N/A 02/06/2019   Procedure: DILATATION AND CURETTAGE /HYSTEROSCOPY;  Surgeon: [name], MD;  Location: [place] [facility];  Service: Gynecology;  Laterality: N/A;   KNEE ARTHROSCOPY Right 2015   TONSILLECTOMY  child   TOTAL KNEE ARTHROPLASTY Right 07/07/2014    Procedure: RIGHT TOTAL KNEE ARTHROPLASTY;  Surgeon: [name], MD;  [facility];  Service: Orthopedics;  Laterality: Right;   TOTAL KNEE ARTHROPLASTY Left 07/06/2015   Procedure: LEFT TOTAL KNEE ARTHROPLASTY;  Surgeon: Gaynelle Arabian, MD;  [facility];  Service: Orthopedics;  Laterality: Left;    There were no vitals filed for this visit.   Subjective Assessment - 10/17/19 1234    Subjective I have been doing my exercises  3x/day.  My dizziness is better. I have been trying to stop and sit still before I move and this seems to be helping.    Currently in Pain? No/denies                             [facility] Adult PT Treatment/Exercise - 10/17/19 0001      Exercises   Exercises Knee/Hip;Ankle      Knee/Hip Exercises: Stretches   Active Hamstring Stretch Left;Right;3 reps;20 seconds      Knee/Hip Exercises: Aerobic   Nustep Level 1x 8 minutes    PT present to monitor for fatigue and anxiety     Knee/Hip Exercises: Seated   Long Arc Quad Strengthening;Both;2 sets;10 reps    Ball Squeeze 2x10  Marching Strengthening;Both;2 sets;10 reps    Sit to [employer] 2 sets;5 reps;with UE support   seated on black pad- uncontrolled descent     Ankle Exercises: Seated   Other Seated Ankle Exercises shoulder ER to promote improved posture 2x10                  PT Education - 10/17/19 1307    Education Details Access Code: [email or web address]    Person(s) Educated Patient    Methods Explanation;Demonstration;Handout    Comprehension Verbalized understanding;Returned demonstration            PT Short Term Goals - 10/14/19 1313      PT SHORT TERM GOAL #1   Title be independent in initial HEP    Baseline --    Time 4    Period Weeks    Status New    Target Date 11/11/19      PT SHORT TERM GOAL #2   Title Pt will improve 5x sit to stand in less than or equal to 25  seconds to demonstrate improved functional strength.    Baseline --    Time 4    Period Weeks     Status New    Target Date 11/11/19      PT SHORT TERM GOAL #3   Title Pt will improve TUG score to less than or equal to 25 seconds    Baseline --    Time 4    Status New    Target Date 11/11/19      PT SHORT TERM GOAL #4   Title stand with erect posture > or = to 50% of the time without cueing    Baseline --    Time 4    Period Weeks    Status New    Target Date 11/11/19      PT SHORT TERM GOAL #5   Title --    Baseline --             PT Long Term Goals - 10/14/19 1315      PT LONG TERM GOAL #1   Title be independent in advanced HEP    Baseline --    Time 8    Period Weeks    Status New    Target Date 12/09/19      PT LONG TERM GOAL #2   Title Pt will perform 5x sit to stand in less than or equal to 19  seconds for improved functional lower extremity strength.    Baseline --    Time 8    Period Weeks    Status New    Target Date 12/09/19      PT LONG TERM GOAL #3   Title perform TUG in < or = to 20 seconds to improve safety and balance    Baseline --    Time 8    Period Weeks    Status New    Target Date 12/09/19      PT LONG TERM GOAL #4   Title perform sit to stand without UE support due to improved functional LE strength    Baseline --    Time 8    Period Weeks    Status New    Target Date 12/09/19      PT LONG TERM GOAL #5   Title --                 Plan - 10/17/19 1236    Clinical  Impression Statement Pt with first time follow-up after evaluation.  Pt arrived using rollator today and demonstrated improved alignment with reduced trunk flexion today.  Pt required max verbal cues to stay on task. Pt had an episode of anxiety during exercise and felt nauseated.  Pt tolerated low level exercise today without limitation.  Pt will continue with exercises at home for continued strength gains.    PT Frequency 2x / week    PT Duration 8 weeks    PT Treatment/Interventions ADLs/Self Care Home Management;Cryotherapy;Gait training;Stair  training;Functional mobility training;Neuromuscular re-education;Balance training;Therapeutic exercise;Therapeutic activities;Patient/family education;Manual techniques;Passive range of motion    PT Next Visit Plan endurance, flexibility, strength    PT Home Exercise Plan Access Code: [email or web address]    Consulted and Agree with Plan of Care Patient           Patient will benefit from skilled therapeutic intervention in order to improve the following deficits and impairments:  Abnormal gait, Decreased activity tolerance, Decreased balance, Decreased endurance, Decreased knowledge of precautions, Decreased mobility, Decreased range of motion, Decreased strength, Impaired flexibility, Pain, Improper body mechanics, Difficulty walking  Visit Diagnosis: Unsteadiness on feet  Other abnormalities of gait and mobility  Muscle weakness (generalized)     Problem List Patient Active Problem List   Diagnosis Date Noted   Torn ear lobe, right, initial encounter 02/14/2018   Gait abnormality 08/15/2017   Memory loss 08/15/2017   Chronic low back pain 07/01/2016   Abnormality of gait 05/12/2016   Paresthesia 05/12/2016   OA (osteoarthritis) of knee 07/07/2014    Sigurd Sos, PT 10/17/19 1:11 PM   [facility] [address] [place], [place], [address] Phone: [phone]   Fax:  [phone]  Name: [name] MRN: [id number] Date of Birth: [birth year]

## 2019-10-22 ENCOUNTER — Other Ambulatory Visit: Payer: Self-pay

## 2019-10-22 ENCOUNTER — Ambulatory Visit: Payer: Medicare Other | Admitting: Physical Therapy

## 2019-10-22 DIAGNOSIS — R2681 Unsteadiness on feet: Secondary | ICD-10-CM | POA: Diagnosis not present

## 2019-10-22 DIAGNOSIS — R2689 Other abnormalities of gait and mobility: Secondary | ICD-10-CM

## 2019-10-22 DIAGNOSIS — M6281 Muscle weakness (generalized): Secondary | ICD-10-CM

## 2019-10-22 NOTE — Therapy (Signed)
[facility] [address], [place], [place], [address] Phone: [phone]   Fax:  [phone]  Physical Therapy Treatment  Patient Details  Name: [name] MRN: [id number] Date of Birth: 1945-07-22 Referring Provider (PT): Sela Hilding, MD   Encounter Date: 10/22/2019   PT End of Session - 10/22/19 1304    Visit Number 3    Date for PT Re-Evaluation 12/09/19    Authorization Type Medicare BCBS    PT Start Time 1230    PT Stop Time 1315    PT Time Calculation (min) 45 min    Activity Tolerance Patient tolerated treatment well           Past Medical History:  Diagnosis Date  . Anxiety   . Bipolar 1 disorder ([place])    followed by leslie o'neal NP  . DDD (degenerative disc disease), cervical   . Depression   . Diabetic retinopathy ([place])    mild right eye per pt  . History of asthma    childhood  . History of duodenal ulcer 1989   remote  . History of external beam radiation therapy    1995  left breast  . History of left breast cancer    1995  s/p  breast lumpectomy and radiation therapy  . Hyperlipidemia   . Hypertension   . OA (osteoarthritis)    hands  . PMB (postmenopausal bleeding)   . Type 2 diabetes mellitus treated with insulin [facility])    endocrinologist-- dr Chalmers Cater--- fasting sugar 140 (7 day average),  checks 4-5 times daily  . Urge urinary incontinence   . Wears glasses     Past Surgical History:  Procedure Laterality Date  . BREAST LUMPECTOMY  1995   left breast / radiation  . DILATION AND CURETTAGE OF UTERUS  1989  . HYSTEROSCOPY WITH D & C N/A 02/06/2019   Procedure: DILATATION AND CURETTAGE /HYSTEROSCOPY;  Surgeon: [name], MD;  Location: [place];  Service: Gynecology;  Laterality: N/A;  . KNEE ARTHROSCOPY Right 2015  . TONSILLECTOMY  child  . TOTAL KNEE ARTHROPLASTY Right 07/07/2014   Procedure: RIGHT TOTAL KNEE ARTHROPLASTY;  Surgeon: [name], MD;  [facility];  Service: Orthopedics;  Laterality: Right;  . TOTAL KNEE ARTHROPLASTY Left 07/06/2015   Procedure: LEFT TOTAL KNEE ARTHROPLASTY;  Surgeon: Gaynelle Arabian, MD;  [facility];  Service: Orthopedics;  Laterality: Left;    There were no vitals filed for this visit.   Subjective Assessment - 10/22/19 1234    Subjective I've been doing my exercises at home.  I have a luncheon with a friend on Thursday.    Currently in Pain? No/denies                             [facility] Adult PT Treatment/Exercise - 10/22/19 0001      Knee/Hip Exercises: Stretches   Active Hamstring Stretch Right;Left;5 reps;20 seconds    Active Hamstring Stretch Limitations seated       Knee/Hip Exercises: Aerobic   Nustep Level 1x 9 minutes spm 40-50   PT present to monitor for fatigue and anxiety     Knee/Hip Exercises: Seated   Long Arc Quad Strengthening;Right;Left;10 reps    Long Arc Quad Weight 2 lbs.    Clamshell with TheraBand Red   red band    Other Seated Knee/Hip Exercises seated red pull ups 10x  Other Seated Knee/Hip Exercises red band press out 10x right/left     Marching Strengthening;Right;Left;10 reps;Weights    Marching Weights 2 lbs.    Sit to Sand 2 sets;5 reps;with UE support   mat table with black foam pad; light touch to walker      Shoulder Exercises: Seated   Other Seated Exercises red band UE rows 10x    Other Seated Exercises red band UE extensions 10x                     PT Short Term Goals - 10/14/19 1313      PT SHORT TERM GOAL #1   Title be independent in initial HEP    Baseline --    Time 4    Period Weeks    Status New    Target Date 11/11/19      PT SHORT TERM GOAL #2   Title Pt will improve 5x sit to stand in less than or equal to 25  seconds to demonstrate improved functional strength.    Baseline --    Time 4    Period Weeks    Status New    Target Date 11/11/19      PT SHORT TERM GOAL #3   Title Pt  will improve TUG score to less than or equal to 25 seconds    Baseline --    Time 4    Status New    Target Date 11/11/19      PT SHORT TERM GOAL #4   Title stand with erect posture > or = to 50% of the time without cueing    Baseline --    Time 4    Period Weeks    Status New    Target Date 11/11/19      PT SHORT TERM GOAL #5   Title --    Baseline --             PT Long Term Goals - 10/14/19 1315      PT LONG TERM GOAL #1   Title be independent in advanced HEP    Baseline --    Time 8    Period Weeks    Status New    Target Date 12/09/19      PT LONG TERM GOAL #2   Title Pt will perform 5x sit to stand in less than or equal to 19  seconds for improved functional lower extremity strength.    Baseline --    Time 8    Period Weeks    Status New    Target Date 12/09/19      PT LONG TERM GOAL #3   Title perform TUG in < or = to 20 seconds to improve safety and balance    Baseline --    Time 8    Period Weeks    Status New    Target Date 12/09/19      PT LONG TERM GOAL #4   Title perform sit to stand without UE support due to improved functional LE strength    Baseline --    Time 8    Period Weeks    Status New    Target Date 12/09/19      PT LONG TERM GOAL #5   Title --                 Plan - 10/22/19 1306    Clinical Impression Statement The patient is able  to increase exercise intensity slightly today.  She needs mod verbal cues to refocus on task.  She is fearful of adding resistance but with encouragement she is able to perform without difficulty.  She needs lots of verbal cues to use her walker until she gets right in front of the chair before sitting, tends to sit on the armrest or on the edge of the chair.  Recommend a continued slower progression of exercise secondary to fearfulness of physical activity and anxiety.  Close supervision also needed for safety.    Comorbidities chronic balance deficts, bipolar disorder, HTN    Rehab Potential  Good    PT Frequency 2x / week    PT Duration 8 weeks    PT Treatment/Interventions ADLs/Self Care Home Management;Cryotherapy;Gait training;Stair training;Functional mobility training;Neuromuscular re-education;Balance training;Therapeutic exercise;Therapeutic activities;Patient/family education;Manual techniques;Passive range of motion    PT Next Visit Plan endurance, flexibility, strength;  low level/slower progression needed;  Add to HEP   PT Home Exercise Plan Access Code: [email or web address]           Patient will benefit from skilled therapeutic intervention in order to improve the following deficits and impairments:  Abnormal gait, Decreased activity tolerance, Decreased balance, Decreased endurance, Decreased knowledge of precautions, Decreased mobility, Decreased range of motion, Decreased strength, Impaired flexibility, Pain, Improper body mechanics, Difficulty walking  Visit Diagnosis: Unsteadiness on feet  Other abnormalities of gait and mobility  Muscle weakness (generalized)     Problem List Patient Active Problem List   Diagnosis Date Noted  . Torn ear lobe, right, initial encounter 02/14/2018  . Gait abnormality 08/15/2017  . Memory loss 08/15/2017  . Chronic low back pain 07/01/2016  . Abnormality of gait 05/12/2016  . Paresthesia 05/12/2016  . OA (osteoarthritis) of knee 07/07/2014   Ruben Im, PT 10/22/19 5:53 PM Phone: [phone] Fax: [phone] Alvera Singh 10/22/2019, 5:52 PM  [facility] [address] [place], [place], [address] Phone: [phone]   Fax:  [phone]  Name: [name] MRN: [id number] Date of Birth: 10-08-1945

## 2019-10-24 ENCOUNTER — Other Ambulatory Visit: Payer: Self-pay

## 2019-10-24 ENCOUNTER — Ambulatory Visit: Payer: Medicare Other | Admitting: Physical Therapy

## 2019-10-24 DIAGNOSIS — R2681 Unsteadiness on feet: Secondary | ICD-10-CM

## 2019-10-24 DIAGNOSIS — R2689 Other abnormalities of gait and mobility: Secondary | ICD-10-CM

## 2019-10-24 DIAGNOSIS — M6281 Muscle weakness (generalized): Secondary | ICD-10-CM

## 2019-10-24 NOTE — Therapy (Signed)
[facility] [address] [place], [place], [address] Phone: [phone]   Fax:  [phone]  Physical Therapy Treatment  Patient Details  Name: [name] MRN: [id number] Date of Birth: 1945/08/03 Referring Provider (PT): Sela Hilding, MD   Encounter Date: 10/24/2019   PT End of Session - 10/24/19 1439    Visit Number 4    Date for PT Re-Evaluation 12/09/19    Authorization Type Medicare BCBS    PT Start Time 6948    PT Stop Time 5462    PT Time Calculation (min) 44 min    Activity Tolerance Patient tolerated treatment well           Past Medical History:  Diagnosis Date  . Anxiety   . Bipolar 1 disorder ([place])    followed by leslie o'neal NP  . DDD (degenerative disc disease), cervical   . Depression   . Diabetic retinopathy ([place])    mild right eye per pt  . History of asthma    childhood  . History of duodenal ulcer 1989   remote  . History of external beam radiation therapy    1995  left breast  . History of left breast cancer    1995  s/p  breast lumpectomy and radiation therapy  . Hyperlipidemia   . Hypertension   . OA (osteoarthritis)    hands  . PMB (postmenopausal bleeding)   . Type 2 diabetes mellitus treated with insulin [facility])    endocrinologist-- dr Chalmers Cater--- fasting sugar 140 (7 day average),  checks 4-5 times daily  . Urge urinary incontinence   . Wears glasses     Past Surgical History:  Procedure Laterality Date  . BREAST LUMPECTOMY  1995   left breast / radiation  . DILATION AND CURETTAGE OF UTERUS  1989  . HYSTEROSCOPY WITH D & C N/A 02/06/2019   Procedure: DILATATION AND CURETTAGE /HYSTEROSCOPY;  Surgeon: [name], MD;  Location: [place];  Service: Gynecology;  Laterality: N/A;  . KNEE ARTHROSCOPY Right 2015  . TONSILLECTOMY  child  . TOTAL KNEE ARTHROPLASTY Right 07/07/2014   Procedure: RIGHT TOTAL KNEE ARTHROPLASTY;  Surgeon: [name], MD;  [facility];  Service: Orthopedics;  Laterality: Right;  . TOTAL KNEE ARTHROPLASTY Left 07/06/2015   Procedure: LEFT TOTAL KNEE ARTHROPLASTY;  Surgeon: Gaynelle Arabian, MD;  [facility];  Service: Orthopedics;  Laterality: Left;    There were no vitals filed for this visit.   Subjective Assessment - 10/24/19 1358    Subjective Had lunch at [employer] today.   No difficulties after last session.  My husband says he can tell I'm getting stronger.     Currently in Pain? No/denies                             [facility] Adult PT Treatment/Exercise - 10/24/19 0001      Therapeutic Activites    Therapeutic Activities ADL's    ADL's walking, standing,  hip hinge to touch walker seat in standing for lifting      Knee/Hip Exercises: Stretches   Active Hamstring Stretch Right;Left;5 reps;20 seconds    Active Hamstring Stretch Limitations seated       Knee/Hip Exercises: Aerobic   Nustep Level 2x 6 minutes spm 40-50   PT present to monitor for fatigue and anxiety     Knee/Hip Exercises: Standing   Hip Abduction  AROM;Right;Left;5 reps    Hip Extension AROM;Right;Left;5 reps    Lateral Step Up Limitations side step at the counter 5x with UE use     Other Standing Knee Exercises holding blue weighted ball bicep curls 6x       Knee/Hip Exercises: Seated   Long Arc Quad Strengthening;Right;Left;10 reps    Long Arc Quad Weight 2 lbs.    Other Seated Knee/Hip Exercises seated dead lifts with blue weighted ball 7x     Marching Strengthening;Right;Left;10 reps;Weights    Marching Weights 2 lbs.    Sit to Sand 10 reps;with UE support   mat table with black foam pad; light touch to walker      Shoulder Exercises: Seated   Other Seated Exercises beach ball toss 2 minutes       Shoulder Exercises: Standing   Other Standing Exercises counter push ups 10x                   PT Education - 10/24/19 1438    Education Details counter push ups, sit to stand 5  reps 2x/day    Person(s) Educated Patient    Methods Explanation;Demonstration;Handout    Comprehension Returned demonstration;Verbalized understanding            PT Short Term Goals - 10/14/19 1313      PT SHORT TERM GOAL #1   Title be independent in initial HEP    Baseline --    Time 4    Period Weeks    Status New    Target Date 11/11/19      PT SHORT TERM GOAL #2   Title Pt will improve 5x sit to stand in less than or equal to 25  seconds to demonstrate improved functional strength.    Baseline --    Time 4    Period Weeks    Status New    Target Date 11/11/19      PT SHORT TERM GOAL #3   Title Pt will improve TUG score to less than or equal to 25 seconds    Baseline --    Time 4    Status New    Target Date 11/11/19      PT SHORT TERM GOAL #4   Title stand with erect posture > or = to 50% of the time without cueing    Baseline --    Time 4    Period Weeks    Status New    Target Date 11/11/19      PT SHORT TERM GOAL #5   Title --    Baseline --             PT Long Term Goals - 10/14/19 1315      PT LONG TERM GOAL #1   Title be independent in advanced HEP    Baseline --    Time 8    Period Weeks    Status New    Target Date 12/09/19      PT LONG TERM GOAL #2   Title Pt will perform 5x sit to stand in less than or equal to 19  seconds for improved functional lower extremity strength.    Baseline --    Time 8    Period Weeks    Status New    Target Date 12/09/19      PT LONG TERM GOAL #3   Title perform TUG in < or = to 20 seconds to improve safety and balance  Baseline --    Time 8    Period Weeks    Status New    Target Date 12/09/19      PT LONG TERM GOAL #4   Title perform sit to stand without UE support due to improved functional LE strength    Baseline --    Time 8    Period Weeks    Status New    Target Date 12/09/19      PT LONG TERM GOAL #5   Title --                 Plan - 10/24/19 1436    Clinical  Impression Statement The patient needs verbal cues to avoid pulling up from her walker with sit to stand.  She is able to perform a progression of exercise to more standing type ex's which increases the intensity.  She needs moderate cues to avoid compensatory strategies and to hold her head up when standing.  No loss of balance but therapist providing close supervision for safety.    Comorbidities chronic balance deficts, bipolar disorder, HTN    Examination-Activity Limitations Locomotion Level;Bathing;Squat;Stairs;Stand;Transfers    Rehab Potential Good    PT Frequency 2x / week    PT Duration 8 weeks    PT Treatment/Interventions ADLs/Self Care Home Management;Cryotherapy;Gait training;Stair training;Functional mobility training;Neuromuscular re-education;Balance training;Therapeutic exercise;Therapeutic activities;Patient/family education;Manual techniques;Passive range of motion    PT Next Visit Plan endurance, flexibility, strength;  low level/slower progression needed    PT Home Exercise Plan Access Code: [email or web address]           Patient will benefit from skilled therapeutic intervention in order to improve the following deficits and impairments:  Abnormal gait, Decreased activity tolerance, Decreased balance, Decreased endurance, Decreased knowledge of precautions, Decreased mobility, Decreased range of motion, Decreased strength, Impaired flexibility, Pain, Improper body mechanics, Difficulty walking  Visit Diagnosis: Unsteadiness on feet  Other abnormalities of gait and mobility  Muscle weakness (generalized)     Problem List Patient Active Problem List   Diagnosis Date Noted  . Torn ear lobe, right, initial encounter 02/14/2018  . Gait abnormality 08/15/2017  . Memory loss 08/15/2017  . Chronic low back pain 07/01/2016  . Abnormality of gait 05/12/2016  . Paresthesia 05/12/2016  . OA (osteoarthritis) of knee 07/07/2014   Ruben Im, PT 10/24/19 2:59 PM Phone:  [phone] Fax: [phone] Alvera Singh 10/24/2019, 2:57 PM  [facility] [address], [place], [place], [address] Phone: [phone]   Fax:  [phone]  Name: [name] MRN: [id number] Date of Birth: Nov 06, 1945

## 2019-10-24 NOTE — Patient Instructions (Signed)
Access Code: [email or web address] URL: [email or web address] Date: 10/24/2019 Prepared by: Ruben Im  Exercises Seated Long Arc Quad - 3 x daily - 7 x weekly - 10 reps - 2 sets - 5 hold Seated March - 3 x daily - 7 x weekly - 10 reps - 3 sets Seated Heel Toe Raises - 3 x daily - 7 x weekly - 10 reps - 2 sets Correct Standing Posture - 1 x daily - 7 x weekly - 10 reps - 3 sets Standing Shoulder External Rotation with Resistance - 2 x daily - 7 x weekly - 2 sets - 10 reps Sit to Stand with Counter Support - 2 x daily - 7 x weekly - 1 sets - 5 reps Push-Up on Counter - 2 x daily - 7 x weekly - 1 sets - 5 reps

## 2019-10-29 ENCOUNTER — Ambulatory Visit: Payer: Medicare Other | Admitting: Physical Therapy

## 2019-10-29 ENCOUNTER — Other Ambulatory Visit: Payer: Self-pay

## 2019-10-29 DIAGNOSIS — R2689 Other abnormalities of gait and mobility: Secondary | ICD-10-CM

## 2019-10-29 DIAGNOSIS — R2681 Unsteadiness on feet: Secondary | ICD-10-CM

## 2019-10-29 DIAGNOSIS — M6281 Muscle weakness (generalized): Secondary | ICD-10-CM

## 2019-10-29 NOTE — Therapy (Signed)
[facility] [address], [place], [place], [address] Phone: [phone]   Fax:  [phone]  Physical Therapy Treatment  Patient Details  Name: [name] MRN: [id number] Date of Birth: 02-10-1946 Referring Provider (PT): Sela Hilding, MD   Encounter Date: 10/29/2019   PT End of Session - 10/29/19 1407    Visit Number 5    Date for PT Re-Evaluation 12/09/19    Authorization Type Medicare BCBS    PT Start Time 1400    PT Stop Time 1442    PT Time Calculation (min) 42 min    Activity Tolerance Patient tolerated treatment well           Past Medical History:  Diagnosis Date  . Anxiety   . Bipolar 1 disorder ([place])    followed by leslie o'neal NP  . DDD (degenerative disc disease), cervical   . Depression   . Diabetic retinopathy ([place])    mild right eye per pt  . History of asthma    childhood  . History of duodenal ulcer 1989   remote  . History of external beam radiation therapy    1995  left breast  . History of left breast cancer    1995  s/p  breast lumpectomy and radiation therapy  . Hyperlipidemia   . Hypertension   . OA (osteoarthritis)    hands  . PMB (postmenopausal bleeding)   . Type 2 diabetes mellitus treated with insulin [facility])    endocrinologist-- dr Chalmers Cater--- fasting sugar 140 (7 day average),  checks 4-5 times daily  . Urge urinary incontinence   . Wears glasses     Past Surgical History:  Procedure Laterality Date  . BREAST LUMPECTOMY  1995   left breast / radiation  . DILATION AND CURETTAGE OF UTERUS  1989  . HYSTEROSCOPY WITH D & C N/A 02/06/2019   Procedure: DILATATION AND CURETTAGE /HYSTEROSCOPY;  Surgeon: [name], MD;  Location: [place];  Service: Gynecology;  Laterality: N/A;  . KNEE ARTHROSCOPY Right 2015  . TONSILLECTOMY  child  . TOTAL KNEE ARTHROPLASTY Right 07/07/2014   Procedure: RIGHT TOTAL KNEE ARTHROPLASTY;  Surgeon: [name], MD;  [facility];  Service: Orthopedics;  Laterality: Right;  . TOTAL KNEE ARTHROPLASTY Left 07/06/2015   Procedure: LEFT TOTAL KNEE ARTHROPLASTY;  Surgeon: Gaynelle Arabian, MD;  [facility];  Service: Orthopedics;  Laterality: Left;    There were no vitals filed for this visit.   Subjective Assessment - 10/29/19 1404    Subjective Did a Pilates zoom class today.  I had trouble doing the counter ex's b/c I didn't have the right kind of chair.    Pertinent History balance deficits    Currently in Pain? No/denies                             [facility] Adult PT Treatment/Exercise - 10/29/19 0001      Therapeutic Activites    Therapeutic Activities ADL's    ADL's walking, standing,  hip hinge to touch walker seat in standing for lifting      Knee/Hip Exercises: Aerobic   Nustep L1 7 min    while discussing status     Knee/Hip Exercises: Standing   Heel Raises 10 reps    Hip Abduction AROM;Right;Left;10 reps    Hip Extension AROM;Right;Left;10 reps    Lateral Step Up Right;Left;10 reps;Hand Hold:  2;Step Height: 2"    Lateral Step Up Limitations side step at the counter 5x with UE use     Forward Step Up Right;Left;Hand Hold: 2;Step Height: 2"    Forward Step Up Limitations 2x 30 sec    Other Standing Knee Exercises counter top push ups 10x      Knee/Hip Exercises: Seated   Clamshell with TheraBand Blue   15x   Other Seated Knee/Hip Exercises seated dead lift blue band     Sit to Sand --   holding 3# 5x; 5x High five therapist 5x from mat + foam     Shoulder Exercises: Seated   Row Strengthening;Both;10 reps;Theraband    Theraband Level (Shoulder Row) Level 4 (Blue)                    PT Short Term Goals - 10/14/19 1313      PT SHORT TERM GOAL #1   Title be independent in initial HEP    Baseline --    Time 4    Period Weeks    Status New    Target Date 11/11/19      PT SHORT TERM GOAL #2   Title Pt will improve 5x sit to stand  in less than or equal to 25  seconds to demonstrate improved functional strength.    Baseline --    Time 4    Period Weeks    Status New    Target Date 11/11/19      PT SHORT TERM GOAL #3   Title Pt will improve TUG score to less than or equal to 25 seconds    Baseline --    Time 4    Status New    Target Date 11/11/19      PT SHORT TERM GOAL #4   Title stand with erect posture > or = to 50% of the time without cueing    Baseline --    Time 4    Period Weeks    Status New    Target Date 11/11/19      PT SHORT TERM GOAL #5   Title --    Baseline --             PT Long Term Goals - 10/14/19 1315      PT LONG TERM GOAL #1   Title be independent in advanced HEP    Baseline --    Time 8    Period Weeks    Status New    Target Date 12/09/19      PT LONG TERM GOAL #2   Title Pt will perform 5x sit to stand in less than or equal to 19  seconds for improved functional lower extremity strength.    Baseline --    Time 8    Period Weeks    Status New    Target Date 12/09/19      PT LONG TERM GOAL #3   Title perform TUG in < or = to 20 seconds to improve safety and balance    Baseline --    Time 8    Period Weeks    Status New    Target Date 12/09/19      PT LONG TERM GOAL #4   Title perform sit to stand without UE support due to improved functional LE strength    Baseline --    Time 8    Period Weeks    Status New  Target Date 12/09/19      PT LONG TERM GOAL #5   Title --                 Plan - 10/29/19 1738    Clinical Impression Statement The patient needs max cues for safety with sit to stand transfers as she tends to pull up from her rolling walker and does not reach back for before sitting down.  She also needs frequent cues to remain focused on task given.  Counting aloud with therapist helps as well as 30 sec timed increments.  Overall she is less fearful than on initial visits.  She needs close supervision for safety in standing although no  loss of balance today during session.    Comorbidities chronic balance deficts, bipolar disorder, HTN    Examination-Participation Restrictions Driving;Community Activity;Cleaning;Shop    Rehab Potential Good    PT Frequency 2x / week    PT Duration 8 weeks    PT Treatment/Interventions ADLs/Self Care Home Management;Cryotherapy;Gait training;Stair training;Functional mobility training;Neuromuscular re-education;Balance training;Therapeutic exercise;Therapeutic activities;Patient/family education;Manual techniques;Passive range of motion    PT Next Visit Plan endurance, flexibility, strength;  low level/slower progression needed    PT Home Exercise Plan Access Code: [email or web address]           Patient will benefit from skilled therapeutic intervention in order to improve the following deficits and impairments:  Abnormal gait, Decreased activity tolerance, Decreased balance, Decreased endurance, Decreased knowledge of precautions, Decreased mobility, Decreased range of motion, Decreased strength, Impaired flexibility, Pain, Improper body mechanics, Difficulty walking  Visit Diagnosis: Unsteadiness on feet  Other abnormalities of gait and mobility  Muscle weakness (generalized)     Problem List Patient Active Problem List   Diagnosis Date Noted  . Torn ear lobe, right, initial encounter 02/14/2018  . Gait abnormality 08/15/2017  . Memory loss 08/15/2017  . Chronic low back pain 07/01/2016  . Abnormality of gait 05/12/2016  . Paresthesia 05/12/2016  . OA (osteoarthritis) of knee 07/07/2014   Ruben Im, PT 10/29/19 5:46 PM Phone: [phone] Fax: [phone] Alvera Singh 10/29/2019, 5:45 PM  [facility] [address], [place], [place], [address] Phone: [phone]   Fax:  [phone]  Name: [name] MRN: [id number] Date of Birth: 05-Aug-1945

## 2019-10-30 ENCOUNTER — Other Ambulatory Visit: Payer: Self-pay

## 2019-10-30 ENCOUNTER — Ambulatory Visit: Payer: Medicare Other

## 2019-10-30 DIAGNOSIS — R2681 Unsteadiness on feet: Secondary | ICD-10-CM | POA: Diagnosis not present

## 2019-10-30 DIAGNOSIS — R2689 Other abnormalities of gait and mobility: Secondary | ICD-10-CM

## 2019-10-30 DIAGNOSIS — M6281 Muscle weakness (generalized): Secondary | ICD-10-CM

## 2019-10-30 NOTE — Therapy (Signed)
[facility] [address], [place], [place], [address] Phone: [phone]   Fax:  [phone]  Physical Therapy Treatment  Patient Details  Name: [name] MRN: [id number] Date of Birth: 1946-04-07 Referring Provider (PT): Sela Hilding, MD   Encounter Date: 10/30/2019   PT End of Session - 10/30/19 1140    Visit Number 6    Date for PT Re-Evaluation 12/09/19    Authorization Type Medicare BCBS    PT Start Time 1108    PT Stop Time 1143    PT Time Calculation (min) 35 min    Activity Tolerance Patient tolerated treatment well    Behavior During Therapy [facility] for tasks assessed/performed           Past Medical History:  Diagnosis Date  . Anxiety   . Bipolar 1 disorder ([place])    followed by leslie o'neal NP  . DDD (degenerative disc disease), cervical   . Depression   . Diabetic retinopathy ([place])    mild right eye per pt  . History of asthma    childhood  . History of duodenal ulcer 1989   remote  . History of external beam radiation therapy    1995  left breast  . History of left breast cancer    1995  s/p  breast lumpectomy and radiation therapy  . Hyperlipidemia   . Hypertension   . OA (osteoarthritis)    hands  . PMB (postmenopausal bleeding)   . Type 2 diabetes mellitus treated with insulin [facility])    endocrinologist-- dr Chalmers Cater--- fasting sugar 140 (7 day average),  checks 4-5 times daily  . Urge urinary incontinence   . Wears glasses     Past Surgical History:  Procedure Laterality Date  . BREAST LUMPECTOMY  1995   left breast / radiation  . DILATION AND CURETTAGE OF UTERUS  1989  . HYSTEROSCOPY WITH D & C N/A 02/06/2019   Procedure: DILATATION AND CURETTAGE /HYSTEROSCOPY;  Surgeon: [name], MD;  Location: [place];  Service: Gynecology;  Laterality: N/A;  . KNEE ARTHROSCOPY Right 2015  . TONSILLECTOMY  child  . TOTAL KNEE ARTHROPLASTY Right 07/07/2014    Procedure: RIGHT TOTAL KNEE ARTHROPLASTY;  Surgeon: [name], MD;  [facility];  Service: Orthopedics;  Laterality: Right;  . TOTAL KNEE ARTHROPLASTY Left 07/06/2015   Procedure: LEFT TOTAL KNEE ARTHROPLASTY;  Surgeon: Gaynelle Arabian, MD;  Location: [facility];  Service: Orthopedics;  Laterality: Left;    There were no vitals filed for this visit.   Subjective Assessment - 10/30/19 1109    Subjective I have been doing some of my exercises.                             [facility] Adult PT Treatment/Exercise - 10/30/19 0001      Knee/Hip Exercises: Aerobic   Nustep L1 7 min    while discussing status     Knee/Hip Exercises: Standing   Heel Raises 20 reps    Hip Abduction AROM;Right;Left;3 sets;5 sets    Abduction Limitations 2# added      Knee/Hip Exercises: Seated   Long Arc Quad Strengthening;Both;10 reps    Long Arc Quad Weight 2 lbs.    Marching Strengthening;Right;Left;Weights;20 reps    Marching Limitations 2    Sit to [employer] 2 sets;5 reps;with UE support   1 UE support, good posture and technique  PT Short Term Goals - 10/14/19 1313      PT SHORT TERM GOAL #1   Title be independent in initial HEP    Baseline --    Time 4    Period Weeks    Status New    Target Date 11/11/19      PT SHORT TERM GOAL #2   Title Pt will improve 5x sit to stand in less than or equal to 25  seconds to demonstrate improved functional strength.    Baseline --    Time 4    Period Weeks    Status New    Target Date 11/11/19      PT SHORT TERM GOAL #3   Title Pt will improve TUG score to less than or equal to 25 seconds    Baseline --    Time 4    Status New    Target Date 11/11/19      PT SHORT TERM GOAL #4   Title stand with erect posture > or = to 50% of the time without cueing    Baseline --    Time 4    Period Weeks    Status New    Target Date 11/11/19      PT SHORT TERM GOAL #5   Title --    Baseline --             PT  Long Term Goals - 10/14/19 1315      PT LONG TERM GOAL #1   Title be independent in advanced HEP    Baseline --    Time 8    Period Weeks    Status New    Target Date 12/09/19      PT LONG TERM GOAL #2   Title Pt will perform 5x sit to stand in less than or equal to 19  seconds for improved functional lower extremity strength.    Baseline --    Time 8    Period Weeks    Status New    Target Date 12/09/19      PT LONG TERM GOAL #3   Title perform TUG in < or = to 20 seconds to improve safety and balance    Baseline --    Time 8    Period Weeks    Status New    Target Date 12/09/19      PT LONG TERM GOAL #4   Title perform sit to stand without UE support due to improved functional LE strength    Baseline --    Time 8    Period Weeks    Status New    Target Date 12/09/19      PT LONG TERM GOAL #5   Title --                 Plan - 10/30/19 1142    Clinical Impression Statement The patient needs max cues for safety with sit to stand transfers.  Pt requires max verbal cues with transitions. Overall, transfers are improved with upright posture and decreased use of UEs. Pt needs frequent cues to remain focused on exercise.e  Counting aloud with therapist helps as well as 30 sec timed increments.  She needs close supervision for safety in standing although no loss of balance today during session.  Pt will continue to benefit from skilled PT to improve strength, endurance and gait to improve safety at home and in the community.    Rehab Potential  Good    PT Frequency 2x / week    PT Duration 8 weeks    PT Treatment/Interventions ADLs/Self Care Home Management;Cryotherapy;Gait training;Stair training;Functional mobility training;Neuromuscular re-education;Balance training;Therapeutic exercise;Therapeutic activities;Patient/family education;Manual techniques;Passive range of motion    PT Next Visit Plan endurance, flexibility, strength;  low level/slower progression needed     PT Home Exercise Plan Access Code: [email or web address]    Consulted and Agree with Plan of Care Patient           Patient will benefit from skilled therapeutic intervention in order to improve the following deficits and impairments:  Abnormal gait, Decreased activity tolerance, Decreased balance, Decreased endurance, Decreased knowledge of precautions, Decreased mobility, Decreased range of motion, Decreased strength, Impaired flexibility, Pain, Improper body mechanics, Difficulty walking  Visit Diagnosis: Other abnormalities of gait and mobility  Unsteadiness on feet  Muscle weakness (generalized)     Problem List Patient Active Problem List   Diagnosis Date Noted  . Torn ear lobe, right, initial encounter 02/14/2018  . Gait abnormality 08/15/2017  . Memory loss 08/15/2017  . Chronic low back pain 07/01/2016  . Abnormality of gait 05/12/2016  . Paresthesia 05/12/2016  . OA (osteoarthritis) of knee 07/07/2014     Sigurd Sos, PT 10/30/19 11:43 AM  [facility] [address], [place], [place], [address] Phone: [phone]   Fax:  [phone]  Name: [name] MRN: [id number] Date of Birth: [birth year]

## 2019-11-05 ENCOUNTER — Other Ambulatory Visit: Payer: Self-pay

## 2019-11-05 ENCOUNTER — Ambulatory Visit: Payer: Medicare Other | Admitting: Physical Therapy

## 2019-11-05 ENCOUNTER — Encounter: Payer: Self-pay | Admitting: Physical Therapy

## 2019-11-05 DIAGNOSIS — R2689 Other abnormalities of gait and mobility: Secondary | ICD-10-CM

## 2019-11-05 DIAGNOSIS — R2681 Unsteadiness on feet: Secondary | ICD-10-CM

## 2019-11-05 DIAGNOSIS — M6281 Muscle weakness (generalized): Secondary | ICD-10-CM

## 2019-11-05 NOTE — Therapy (Signed)
[facility] [address] [place], [place], [address] Phone: [phone]   Fax:  [phone]  Physical Therapy Treatment  Patient Details  Name: [name] MRN: [id number] Date of Birth: Feb 16, 1946 Referring Provider (PT): Sela Hilding, MD   Encounter Date: 11/05/2019   PT End of Session - 11/05/19 1359    Visit Number 7    Date for PT Re-Evaluation 12/09/19    Authorization Type Medicare BCBS    PT Start Time 0093    PT Stop Time 1228    PT Time Calculation (min) 43 min    Activity Tolerance Patient tolerated treatment well           Past Medical History:  Diagnosis Date  . Anxiety   . Bipolar 1 disorder ([place])    followed by leslie o'neal NP  . DDD (degenerative disc disease), cervical   . Depression   . Diabetic retinopathy ([place])    mild right eye per pt  . History of asthma    childhood  . History of duodenal ulcer 1989   remote  . History of external beam radiation therapy    1995  left breast  . History of left breast cancer    1995  s/p  breast lumpectomy and radiation therapy  . Hyperlipidemia   . Hypertension   . OA (osteoarthritis)    hands  . PMB (postmenopausal bleeding)   . Type 2 diabetes mellitus treated with insulin [facility])    endocrinologist-- dr Chalmers Cater--- fasting sugar 140 (7 day average),  checks 4-5 times daily  . Urge urinary incontinence   . Wears glasses     Past Surgical History:  Procedure Laterality Date  . BREAST LUMPECTOMY  1995   left breast / radiation  . DILATION AND CURETTAGE OF UTERUS  1989  . HYSTEROSCOPY WITH D & C N/A 02/06/2019   Procedure: DILATATION AND CURETTAGE /HYSTEROSCOPY;  Surgeon: [name], MD;  Location: [place];  Service: Gynecology;  Laterality: N/A;  . KNEE ARTHROSCOPY Right 2015  . TONSILLECTOMY  child  . TOTAL KNEE ARTHROPLASTY Right 07/07/2014   Procedure: RIGHT TOTAL KNEE ARTHROPLASTY;  Surgeon: [name], MD;  [facility];  Service: Orthopedics;  Laterality: Right;  . TOTAL KNEE ARTHROPLASTY Left 07/06/2015   Procedure: LEFT TOTAL KNEE ARTHROPLASTY;  Surgeon: Gaynelle Arabian, MD;  [facility];  Service: Orthopedics;  Laterality: Left;    There were no vitals filed for this visit.   Subjective Assessment - 11/05/19 1156    Subjective Did fine last time.    Pertinent History balance deficits    Patient Stated Goals reduce falls & dizziness    Currently in Pain? No/denies                             [facility] Adult PT Treatment/Exercise - 11/05/19 0001      Therapeutic Activites    Therapeutic Activities ADL's    ADL's walking, standing,  hip hinge to touch walker seat in standing for lifting      Neuro Re-ed    Neuro Re-ed Details  reaching forward and overhead; right and left side reaching       Knee/Hip Exercises: Aerobic   Nustep L1 9 min    while discussing status     Knee/Hip Exercises: Standing   Heel Raises 10 reps    Forward Step Up Right;Left;1 set;10  reps;Hand Hold: 2;Step Height: 4"      Knee/Hip Exercises: Seated   Other Seated Knee/Hip Exercises seated dead lift 5# 10x     Sit to Sand 5 reps   5# holding; then 10 more reps with light UE assist      Shoulder Exercises: Seated   Row Strengthening;Both;15 reps;Theraband    Theraband Level (Shoulder Row) Level 3 (Green)                    PT Short Term Goals - 10/14/19 1313      PT SHORT TERM GOAL #1   Title be independent in initial HEP    Baseline --    Time 4    Period Weeks    Status New    Target Date 11/11/19      PT SHORT TERM GOAL #2   Title Pt will improve 5x sit to stand in less than or equal to 25  seconds to demonstrate improved functional strength.    Baseline --    Time 4    Period Weeks    Status New    Target Date 11/11/19      PT SHORT TERM GOAL #3   Title Pt will improve TUG score to less than or equal to 25 seconds    Baseline --    Time 4     Status New    Target Date 11/11/19      PT SHORT TERM GOAL #4   Title stand with erect posture > or = to 50% of the time without cueing    Baseline --    Time 4    Period Weeks    Status New    Target Date 11/11/19      PT SHORT TERM GOAL #5   Title --    Baseline --             PT Long Term Goals - 10/14/19 1315      PT LONG TERM GOAL #1   Title be independent in advanced HEP    Baseline --    Time 8    Period Weeks    Status New    Target Date 12/09/19      PT LONG TERM GOAL #2   Title Pt will perform 5x sit to stand in less than or equal to 19  seconds for improved functional lower extremity strength.    Baseline --    Time 8    Period Weeks    Status New    Target Date 12/09/19      PT LONG TERM GOAL #3   Title perform TUG in < or = to 20 seconds to improve safety and balance    Baseline --    Time 8    Period Weeks    Status New    Target Date 12/09/19      PT LONG TERM GOAL #4   Title perform sit to stand without UE support due to improved functional LE strength    Baseline --    Time 8    Period Weeks    Status New    Target Date 12/09/19      PT LONG TERM GOAL #5   Title --                 Plan - 11/05/19 1359    Clinical Impression Statement The patient needs max cues to to push up from the chair rather  than pulling from her rolling walker as well as to reach back for the chair before sitting, plopping into the chair seat.  She loses focus quickly with tasks and needs max cues to complete a set of 10 repetitions including assist with counting and reminders on the task at hand every 4-5 reps.  Therapist closely monitoring response and also providing close supervision for safety.  Fewer rest breaks needed today.  Patient is less fearful of activity.    Comorbidities chronic balance deficts, bipolar disorder, HTN    Examination-Activity Limitations Locomotion Level;Bathing;Squat;Stairs;Stand;Transfers    Rehab Potential Good    PT  Frequency 2x / week    PT Duration 8 weeks    PT Treatment/Interventions ADLs/Self Care Home Management;Cryotherapy;Gait training;Stair training;Functional mobility training;Neuromuscular re-education;Balance training;Therapeutic exercise;Therapeutic activities;Patient/family education;Manual techniques;Passive range of motion    PT Next Visit Plan check STGS in 1 week; endurance, flexibility, strength;  low level/slower progression needed    PT Home Exercise Plan Access Code: [email or web address]           Patient will benefit from skilled therapeutic intervention in order to improve the following deficits and impairments:  Abnormal gait, Decreased activity tolerance, Decreased balance, Decreased endurance, Decreased knowledge of precautions, Decreased mobility, Decreased range of motion, Decreased strength, Impaired flexibility, Pain, Improper body mechanics, Difficulty walking  Visit Diagnosis: Other abnormalities of gait and mobility  Unsteadiness on feet  Muscle weakness (generalized)     Problem List Patient Active Problem List   Diagnosis Date Noted  . Torn ear lobe, right, initial encounter 02/14/2018  . Gait abnormality 08/15/2017  . Memory loss 08/15/2017  . Chronic low back pain 07/01/2016  . Abnormality of gait 05/12/2016  . Paresthesia 05/12/2016  . OA (osteoarthritis) of knee 07/07/2014   Ruben Im, PT 11/05/19 2:08 PM Phone: [phone] Fax: [phone] Alvera Singh 11/05/2019, 2:07 PM  [facility] [address], [place], [place], [address] Phone: [phone]   Fax:  [phone]  Name: KAWANNA CHRISTLEY MRN: [id number] Date of Birth: Dec 18, 1945

## 2019-11-12 ENCOUNTER — Encounter: Payer: Self-pay | Admitting: Physical Therapy

## 2019-11-12 ENCOUNTER — Ambulatory Visit: Payer: Medicare Other | Attending: Family Medicine | Admitting: Physical Therapy

## 2019-11-12 ENCOUNTER — Other Ambulatory Visit: Payer: Self-pay

## 2019-11-12 DIAGNOSIS — R2689 Other abnormalities of gait and mobility: Secondary | ICD-10-CM

## 2019-11-12 DIAGNOSIS — M6281 Muscle weakness (generalized): Secondary | ICD-10-CM | POA: Diagnosis present

## 2019-11-12 DIAGNOSIS — R2681 Unsteadiness on feet: Secondary | ICD-10-CM | POA: Diagnosis present

## 2019-11-12 NOTE — Therapy (Signed)
[facility] [address] [place], [place], [address] Phone: [phone]   Fax:  [phone]  Physical Therapy Treatment  Patient Details  Name: [name] MRN: [id number] Date of Birth: 12-04-1945 Referring Provider (PT): Sela Hilding, MD   Encounter Date: 11/12/2019   PT End of Session - 11/12/19 1237    Visit Number 8    Date for PT Re-Evaluation 12/09/19    Authorization Type Medicare BCBS    PT Start Time 1232    PT Stop Time 1315    PT Time Calculation (min) 43 min    Activity Tolerance Patient tolerated treatment well    Behavior During Therapy [facility] for tasks assessed/performed           Past Medical History:  Diagnosis Date  . Anxiety   . Bipolar 1 disorder ([place])    followed by leslie o'neal NP  . DDD (degenerative disc disease), cervical   . Depression   . Diabetic retinopathy ([place])    mild right eye per pt  . History of asthma    childhood  . History of duodenal ulcer 1989   remote  . History of external beam radiation therapy    1995  left breast  . History of left breast cancer    1995  s/p  breast lumpectomy and radiation therapy  . Hyperlipidemia   . Hypertension   . OA (osteoarthritis)    hands  . PMB (postmenopausal bleeding)   . Type 2 diabetes mellitus treated with insulin [facility])    endocrinologist-- dr Chalmers Cater--- fasting sugar 140 (7 day average),  checks 4-5 times daily  . Urge urinary incontinence   . Wears glasses     Past Surgical History:  Procedure Laterality Date  . BREAST LUMPECTOMY  1995   left breast / radiation  . DILATION AND CURETTAGE OF UTERUS  1989  . HYSTEROSCOPY WITH D & C N/A 02/06/2019   Procedure: DILATATION AND CURETTAGE /HYSTEROSCOPY;  Surgeon: [name], MD;  Location: [place];  Service: Gynecology;  Laterality: N/A;  . KNEE ARTHROSCOPY Right 2015  . TONSILLECTOMY  child  . TOTAL KNEE ARTHROPLASTY Right 07/07/2014    Procedure: RIGHT TOTAL KNEE ARTHROPLASTY;  Surgeon: [name], MD;  [facility];  Service: Orthopedics;  Laterality: Right;  . TOTAL KNEE ARTHROPLASTY Left 07/06/2015   Procedure: LEFT TOTAL KNEE ARTHROPLASTY;  Surgeon: Gaynelle Arabian, MD;  [facility];  Service: Orthopedics;  Laterality: Left;    There were no vitals filed for this visit.   Subjective Assessment - 11/12/19 1237    Subjective I just came from my zoom exercise class and I walked for 15 min.    Pertinent History balance deficits    Limitations Walking;Standing    Patient Stated Goals reduce falls & dizziness    Currently in Pain? No/denies                             [facility] Adult PT Treatment/Exercise - 11/12/19 0001      Knee/Hip Exercises: Aerobic   Nustep L1 x 10 min    while discussing status; cues for full hip/knee ROM     Knee/Hip Exercises: Standing   Heel Raises 10 reps    Forward Step Up Right;Left;1 set;10 reps;Hand Hold: 2;Step Height: 4"    Other Standing Knee Exercises semi tandem stance balance with cues to look forward; standing with  wall behind pt; worked on standing balance with head straight, shoulders back cues to engage abdominals when she feels herself falliing backwards.      Knee/Hip Exercises: Seated   Other Seated Knee/Hip Exercises attempted seated deadlift but stopped due to poor form; seated lumbar flexion to [facility] lift with ball x 5; diagonals x 5    Sit to Sand 15 reps   cues for slow descent; used pad for last five                   PT Short Term Goals - 10/14/19 1313      PT SHORT TERM GOAL #1   Title be independent in initial HEP    Baseline --    Time 4    Period Weeks    Status New    Target Date 11/11/19      PT SHORT TERM GOAL #2   Title Pt will improve 5x sit to stand in less than or equal to 25  seconds to demonstrate improved functional strength.    Baseline --    Time 4    Period Weeks    Status New    Target Date 11/11/19       PT SHORT TERM GOAL #3   Title Pt will improve TUG score to less than or equal to 25 seconds    Baseline --    Time 4    Status New    Target Date 11/11/19      PT SHORT TERM GOAL #4   Title stand with erect posture > or = to 50% of the time without cueing    Baseline --    Time 4    Period Weeks    Status New    Target Date 11/11/19      PT SHORT TERM GOAL #5   Title --    Baseline --             PT Long Term Goals - 10/14/19 1315      PT LONG TERM GOAL #1   Title be independent in advanced HEP    Baseline --    Time 8    Period Weeks    Status New    Target Date 12/09/19      PT LONG TERM GOAL #2   Title Pt will perform 5x sit to stand in less than or equal to 19  seconds for improved functional lower extremity strength.    Baseline --    Time 8    Period Weeks    Status New    Target Date 12/09/19      PT LONG TERM GOAL #3   Title perform TUG in < or = to 20 seconds to improve safety and balance    Baseline --    Time 8    Period Weeks    Status New    Target Date 12/09/19      PT LONG TERM GOAL #4   Title perform sit to stand without UE support due to improved functional LE strength    Baseline --    Time 8    Period Weeks    Status New    Target Date 12/09/19      PT LONG TERM GOAL #5   Title --                 Plan - 11/12/19 1329    Clinical Impression Statement Patient continues to require max  cues to stay on task today. We worked on standing balance and changing her COG from bent forward position. Patient anxious but tolerated well with wall behind her.    PT Treatment/Interventions ADLs/Self Care Home Management;Cryotherapy;Gait training;Stair training;Functional mobility training;Neuromuscular re-education;Balance training;Therapeutic exercise;Therapeutic activities;Patient/family education;Manual techniques;Passive range of motion    PT Next Visit Plan check STGS; endurance, flexibility, strength;  low level/slower progression  needed    PT Home Exercise Plan Access Code: [email or web address]           Patient will benefit from skilled therapeutic intervention in order to improve the following deficits and impairments:  Abnormal gait, Decreased activity tolerance, Decreased balance, Decreased endurance, Decreased knowledge of precautions, Decreased mobility, Decreased range of motion, Decreased strength, Impaired flexibility, Pain, Improper body mechanics, Difficulty walking  Visit Diagnosis: Other abnormalities of gait and mobility  Unsteadiness on feet  Muscle weakness (generalized)     Problem List Patient Active Problem List   Diagnosis Date Noted  . Torn ear lobe, right, initial encounter 02/14/2018  . Gait abnormality 08/15/2017  . Memory loss 08/15/2017  . Chronic low back pain 07/01/2016  . Abnormality of gait 05/12/2016  . Paresthesia 05/12/2016  . OA (osteoarthritis) of knee 07/07/2014   Madelyn Flavors PT 11/12/2019, 1:36 PM  [facility] [address] [place], [place], [address] Phone: [phone]   Fax:  [phone]  Name: [name] MRN: [id number] Date of Birth: [birth year]

## 2019-11-14 ENCOUNTER — Other Ambulatory Visit: Payer: Self-pay

## 2019-11-14 ENCOUNTER — Ambulatory Visit: Payer: Medicare Other | Admitting: Physical Therapy

## 2019-11-14 DIAGNOSIS — R2681 Unsteadiness on feet: Secondary | ICD-10-CM

## 2019-11-14 DIAGNOSIS — R2689 Other abnormalities of gait and mobility: Secondary | ICD-10-CM | POA: Diagnosis not present

## 2019-11-14 NOTE — Therapy (Signed)
[facility] [address], [place], [place], [address] Phone: [phone]   Fax:  [phone]  Physical Therapy Treatment  Patient Details  Name: [name] MRN: [id number] Date of Birth: 03-Apr-1946 Referring Provider (PT): Sela Hilding, MD   Encounter Date: 11/14/2019   PT End of Session - 11/14/19 1055    Visit Number 9    Date for PT Re-Evaluation 12/09/19    Authorization Type Medicare BCBS    PT Start Time 2563    PT Stop Time 1058    PT Time Calculation (min) 43 min    Activity Tolerance Patient tolerated treatment well           Past Medical History:  Diagnosis Date  . Anxiety   . Bipolar 1 disorder ([place])    followed by leslie o'neal NP  . DDD (degenerative disc disease), cervical   . Depression   . Diabetic retinopathy ([place])    mild right eye per pt  . History of asthma    childhood  . History of duodenal ulcer 1989   remote  . History of external beam radiation therapy    1995  left breast  . History of left breast cancer    1995  s/p  breast lumpectomy and radiation therapy  . Hyperlipidemia   . Hypertension   . OA (osteoarthritis)    hands  . PMB (postmenopausal bleeding)   . Type 2 diabetes mellitus treated with insulin [facility])    endocrinologist-- dr Chalmers Cater--- fasting sugar 140 (7 day average),  checks 4-5 times daily  . Urge urinary incontinence   . Wears glasses     Past Surgical History:  Procedure Laterality Date  . BREAST LUMPECTOMY  1995   left breast / radiation  . DILATION AND CURETTAGE OF UTERUS  1989  . HYSTEROSCOPY WITH D & C N/A 02/06/2019   Procedure: DILATATION AND CURETTAGE /HYSTEROSCOPY;  Surgeon: [name], MD;  Location: [place];  Service: Gynecology;  Laterality: N/A;  . KNEE ARTHROSCOPY Right 2015  . TONSILLECTOMY  child  . TOTAL KNEE ARTHROPLASTY Right 07/07/2014   Procedure: RIGHT TOTAL KNEE ARTHROPLASTY;  Surgeon: [name], MD;  [facility]: [facility];  Service: Orthopedics;  Laterality: Right;  . TOTAL KNEE ARTHROPLASTY Left 07/06/2015   Procedure: LEFT TOTAL KNEE ARTHROPLASTY;  Surgeon: Gaynelle Arabian, MD;  Location: [facility];  Service: Orthopedics;  Laterality: Left;    There were no vitals filed for this visit.   Subjective Assessment - 11/14/19 1019    Subjective Doing well.  A little pain in the middle of my back b/c I struggled to get my Depends on this morning.    Currently in Pain? Yes    Pain Score 2     Pain Location Back              [facility] PT Assessment - 11/14/19 0001      Transfers   Five time sit to stand comments  47.92   cues to let go of armrests     Timed Up and Go Test   Normal TUG (seconds) 46    TUG Comments needs armrests                          [facility] Adult PT Treatment/Exercise - 11/14/19 0001      Therapeutic Activites    Therapeutic Activities ADL's    ADL's walking, standing,  hip hinge to touch walker seat in standing for lifting, sit to stand       Lumbar Exercises: Standing   Other Standing Lumbar Exercises 5# standing reach between knees 10x       Lumbar Exercises: Seated   Other Seated Lumbar Exercises 5# diagonal chops 1 minute       Knee/Hip Exercises: Aerobic   Recumbent Bike 5 min       Knee/Hip Exercises: Seated   Sit to Sand 2 sets;5 reps;without UE support   2nd set with 5# weight      Shoulder Exercises: Seated   Other Seated Exercises shoulder press 2# 10x right/left       Shoulder Exercises: Standing   Extension Strengthening;Both;15 reps;Theraband    Theraband Level (Shoulder Extension) Level 2 (Red)                    PT Short Term Goals - 11/14/19 1044      PT SHORT TERM GOAL #1   Title be independent in initial HEP    Status Partially Met      PT SHORT TERM GOAL #2   Title Pt will improve 5x sit to stand in less than or equal to 25  seconds to demonstrate improved functional strength.    Time 4     Period Weeks    Status On-going      PT SHORT TERM GOAL #3   Title Pt will improve TUG score to less than or equal to 25 seconds    Time 4    Period Weeks    Status On-going      PT SHORT TERM GOAL #4   Title stand with erect posture > or = to 50% of the time without cueing    Time 4    Period Weeks    Status On-going             PT Long Term Goals - 10/14/19 1315      PT LONG TERM GOAL #1   Title be independent in advanced HEP    Baseline --    Time 8    Period Weeks    Status New    Target Date 12/09/19      PT LONG TERM GOAL #2   Title Pt will perform 5x sit to stand in less than or equal to 19  seconds for improved functional lower extremity strength.    Baseline --    Time 8    Period Weeks    Status New    Target Date 12/09/19      PT LONG TERM GOAL #3   Title perform TUG in < or = to 20 seconds to improve safety and balance    Baseline --    Time 8    Period Weeks    Status New    Target Date 12/09/19      PT LONG TERM GOAL #4   Title perform sit to stand without UE support due to improved functional LE strength    Baseline --    Time 8    Period Weeks    Status New    Target Date 12/09/19      PT LONG TERM GOAL #5   Title --                 Plan - 11/14/19 1106    Clinical Impression Statement The patient continues to require max cues to remain focused on  task.  Her TUG and 5x sit to stand times have not improved and suspect increased times today may be the result of anxiety and cognition.  She stopped during testing to ask if she was doing it correctly and needed cues during the tests to remind "what (she) is supposed to be doing."  She may be slower to achieve goals or limited in achievement secondary to these factors.    Comorbidities chronic balance deficts, bipolar disorder, HTN    Rehab Potential Good    PT Frequency 2x / week    PT Duration 8 weeks    PT Treatment/Interventions ADLs/Self Care Home Management;Cryotherapy;Gait  training;Stair training;Functional mobility training;Neuromuscular re-education;Balance training;Therapeutic exercise;Therapeutic activities;Patient/family education;Manual techniques;Passive range of motion    PT Next Visit Plan 10th visit progress note; MMT LEs;   5x sit to stand and TUG done today but may want to retest;  endurance, flexibility, strength;  low level/slower progression needed    PT Home Exercise Plan Access Code: [email or web address]           Patient will benefit from skilled therapeutic intervention in order to improve the following deficits and impairments:  Abnormal gait, Decreased activity tolerance, Decreased balance, Decreased endurance, Decreased knowledge of precautions, Decreased mobility, Decreased range of motion, Decreased strength, Impaired flexibility, Pain, Improper body mechanics, Difficulty walking  Visit Diagnosis: Other abnormalities of gait and mobility  Unsteadiness on feet     Problem List Patient Active Problem List   Diagnosis Date Noted  . Torn ear lobe, right, initial encounter 02/14/2018  . Gait abnormality 08/15/2017  . Memory loss 08/15/2017  . Chronic low back pain 07/01/2016  . Abnormality of gait 05/12/2016  . Paresthesia 05/12/2016  . OA (osteoarthritis) of knee 07/07/2014   Ruben Im, PT 11/14/19 11:15 AM Phone: [phone] Fax: [phone] Alvera Singh 11/14/2019, 11:14 AM  [facility] [address] [place], [place], [address] Phone: [phone]   Fax:  [phone]  Name: [name] MRN: [id number] Date of Birth: May 22, 1945

## 2019-11-19 ENCOUNTER — Ambulatory Visit: Payer: Medicare Other

## 2019-11-19 ENCOUNTER — Other Ambulatory Visit: Payer: Self-pay

## 2019-11-19 DIAGNOSIS — R2689 Other abnormalities of gait and mobility: Secondary | ICD-10-CM

## 2019-11-19 DIAGNOSIS — R2681 Unsteadiness on feet: Secondary | ICD-10-CM

## 2019-11-19 DIAGNOSIS — M6281 Muscle weakness (generalized): Secondary | ICD-10-CM

## 2019-11-19 NOTE — Therapy (Signed)
[facility] [address] [place], [place], [address] Phone: [phone]   Fax:  [phone]  Physical Therapy Treatment  Patient Details  Name: [name] MRN: [id number] Date of Birth: 1945-12-03 Referring Provider (PT): Sela Hilding, MD   Encounter Date: 11/19/2019 Progress Note Reporting Period 10/14/19 to 11/19/2019  See note below for Objective Data and Assessment of Progress/Goals.       PT End of Session - 11/19/19 1100    Visit Number 10    Date for PT Re-Evaluation 12/09/19    Authorization Type Medicare BCBS    PT Start Time 1019    PT Stop Time 1059    PT Time Calculation (min) 40 min    Activity Tolerance Patient tolerated treatment well    Behavior During Therapy WFL for tasks assessed/performed           Past Medical History:  Diagnosis Date  . Anxiety   . Bipolar 1 disorder ([place])    followed by leslie o'neal NP  . DDD (degenerative disc disease), cervical   . Depression   . Diabetic retinopathy ([place])    mild right eye per pt  . History of asthma    childhood  . History of duodenal ulcer 1989   remote  . History of external beam radiation therapy    1995  left breast  . History of left breast cancer    1995  s/p  breast lumpectomy and radiation therapy  . Hyperlipidemia   . Hypertension   . OA (osteoarthritis)    hands  . PMB (postmenopausal bleeding)   . Type 2 diabetes mellitus treated with insulin [facility])    endocrinologist-- dr Chalmers Cater--- fasting sugar 140 (7 day average),  checks 4-5 times daily  . Urge urinary incontinence   . Wears glasses     Past Surgical History:  Procedure Laterality Date  . BREAST LUMPECTOMY  1995   left breast / radiation  . DILATION AND CURETTAGE OF UTERUS  1989  . HYSTEROSCOPY WITH D & C N/A 02/06/2019   Procedure: DILATATION AND CURETTAGE /HYSTEROSCOPY;  Surgeon: [name], MD;  Location: [place] [address];  Service:  Gynecology;  Laterality: N/A;  . KNEE ARTHROSCOPY Right 2015  . TONSILLECTOMY  child  . TOTAL KNEE ARTHROPLASTY Right 07/07/2014   Procedure: RIGHT TOTAL KNEE ARTHROPLASTY;  Surgeon: [name], MD;  [facility];  Service: Orthopedics;  Laterality: Right;  . TOTAL KNEE ARTHROPLASTY Left 07/06/2015   Procedure: LEFT TOTAL KNEE ARTHROPLASTY;  Surgeon: Gaynelle Arabian, MD;  [facility];  Service: Orthopedics;  Laterality: Left;    There were no vitals filed for this visit.   Subjective Assessment - 11/19/19 1031    Subjective I think coming here is helping by my exercises at home don't seem to be much help.    Currently in Pain? No/denies              [facility] PT Assessment - 11/19/19 0001      Assessment   Medical Diagnosis frequent falls    Referring Provider (PT) Sela Hilding, MD      Prior Function   Level of Independence Independent    Vocation Retired      Cognition   Overall Cognitive Status Within Functional Limits for tasks assessed      Timed Up and Go Test   Normal TUG (seconds) 22.88    TUG Comments No walker today  [place] Adult PT Treatment/Exercise - 11/19/19 0001      Transfers   Sit to Stand 4: Min guard    Five time sit to stand comments  17.9 seconds with uncontrolled descent, use of arms on legs   35 seconds with improved technique   Comments work on sit to stand for 20 minutes of session today      Knee/Hip Exercises: Aerobic   Nustep L1 x 8 min       Knee/Hip Exercises: Standing   Heel Raises 20 reps    Hip Abduction Stengthening;Both;2 sets;10 reps                    PT Short Term Goals - 11/19/19 1023      PT SHORT TERM GOAL #1   Title be independent in initial HEP    Status Achieved      PT SHORT TERM GOAL #2   Title Pt will improve 5x sit to stand in less than or equal to 25  seconds to demonstrate improved functional strength.    Baseline 17 with poor technique, 35 with  improved technique and max cueing for technique    Time 4    Period Weeks    Status On-going      PT SHORT TERM GOAL #3   Title Pt will improve TUG score to less than or equal to 25 seconds    Baseline 22.88 without walker    Status Achieved      PT SHORT TERM GOAL #4   Title stand with erect posture > or = to 50% of the time without cueing    Baseline this requires frequent verbal cues, this has improved by 30%    Status On-going             PT Long Term Goals - 11/19/19 1024      PT LONG TERM GOAL #1   Title be independent in advanced HEP    Time 8    Period Weeks    Status On-going                 Plan - 11/19/19 1055    Clinical Impression Statement The patient continues to require max cues to remain focused on task.  Her TUG and 5x sit to stand times are overall improved.  TUG time is improved when not using walker. Pt has poor technique with sit to stand and requires max tactile, verbal and demo cues for erect posture with standing and to control descent with stand to sit.  A good portion of the session was spent on educating on this movement.  Follow through and cognition limit pt's ability to have steady gains with PT so progress has been slow.  Pt does stand erect 25% of the time after cueing. She may be slower to achieve goals or limited in achievement secondary to these factors.    PT Frequency 2x / week    PT Duration 8 weeks    PT Treatment/Interventions ADLs/Self Care Home Management;Cryotherapy;Gait training;Stair training;Functional mobility training;Neuromuscular re-education;Balance training;Therapeutic exercise;Therapeutic activities;Patient/family education;Manual techniques;Passive range of motion    PT Next Visit Plan work on sit to stand, endurance, strength    PT Home Exercise Plan Access Code: [email or web address]    Consulted and Agree with Plan of Care Patient           Patient will benefit from skilled therapeutic intervention in order to improve the  following deficits and impairments:  Abnormal  gait, Decreased activity tolerance, Decreased balance, Decreased endurance, Decreased knowledge of precautions, Decreased mobility, Decreased range of motion, Decreased strength, Impaired flexibility, Pain, Improper body mechanics, Difficulty walking  Visit Diagnosis: Other abnormalities of gait and mobility  Unsteadiness on feet  Muscle weakness (generalized)     Problem List Patient Active Problem List   Diagnosis Date Noted  . Torn ear lobe, right, initial encounter 02/14/2018  . Gait abnormality 08/15/2017  . Memory loss 08/15/2017  . Chronic low back pain 07/01/2016  . Abnormality of gait 05/12/2016  . Paresthesia 05/12/2016  . OA (osteoarthritis) of knee 07/07/2014     Sigurd Sos, PT 11/19/19 11:02 AM  [facility] [address], [place], [place], [address] Phone: [phone]   Fax:  [phone]  Name: [name] MRN: [id number] Date of Birth: Oct 03, 1945

## 2019-11-21 ENCOUNTER — Other Ambulatory Visit: Payer: Self-pay

## 2019-11-21 ENCOUNTER — Ambulatory Visit: Payer: Medicare Other

## 2019-11-21 DIAGNOSIS — R2689 Other abnormalities of gait and mobility: Secondary | ICD-10-CM | POA: Diagnosis not present

## 2019-11-21 DIAGNOSIS — M6281 Muscle weakness (generalized): Secondary | ICD-10-CM

## 2019-11-21 DIAGNOSIS — R2681 Unsteadiness on feet: Secondary | ICD-10-CM

## 2019-11-21 NOTE — Therapy (Signed)
[facility] [address], [place], [place], [address] Phone: [phone]   Fax:  [phone]  Physical Therapy Treatment  Patient Details  Name: [name] MRN: [id number] Date of Birth: [birth year] Referring Provider (PT): Sela Hilding, MD   Encounter Date: 11/21/2019   PT End of Session - 11/21/19 1229    Visit Number 11    Date for PT Re-Evaluation 12/09/19    Authorization Type Medicare BCBS    PT Start Time 1147    PT Stop Time 1225    PT Time Calculation (min) 38 min    Activity Tolerance Patient tolerated treatment well    Behavior During Therapy [facility] for tasks assessed/performed           Past Medical History:  Diagnosis Date  . Anxiety   . Bipolar 1 disorder ([place])    followed by leslie o'neal NP  . DDD (degenerative disc disease), cervical   . Depression   . Diabetic retinopathy ([place])    mild right eye per pt  . History of asthma    childhood  . History of duodenal ulcer 1989   remote  . History of external beam radiation therapy    1995  left breast  . History of left breast cancer    1995  s/p  breast lumpectomy and radiation therapy  . Hyperlipidemia   . Hypertension   . OA (osteoarthritis)    hands  . PMB (postmenopausal bleeding)   . Type 2 diabetes mellitus treated with insulin [facility])    endocrinologist-- dr Chalmers Cater--- fasting sugar 140 (7 day average),  checks 4-5 times daily  . Urge urinary incontinence   . Wears glasses     Past Surgical History:  Procedure Laterality Date  . BREAST LUMPECTOMY  1995   left breast / radiation  . DILATION AND CURETTAGE OF UTERUS  1989  . HYSTEROSCOPY WITH D & C N/A 02/06/2019   Procedure: DILATATION AND CURETTAGE /HYSTEROSCOPY;  Surgeon: [name], MD;  Location: [place];  Service: Gynecology;  Laterality: N/A;  . KNEE ARTHROSCOPY Right 2015  . TONSILLECTOMY  child  . TOTAL KNEE ARTHROPLASTY Right 07/07/2014    Procedure: RIGHT TOTAL KNEE ARTHROPLASTY;  Surgeon: [name], MD;  [facility];  Service: Orthopedics;  Laterality: Right;  . TOTAL KNEE ARTHROPLASTY Left 07/06/2015   Procedure: LEFT TOTAL KNEE ARTHROPLASTY;  Surgeon: Gaynelle Arabian, MD;  [facility];  Service: Orthopedics;  Laterality: Left;    There were no vitals filed for this visit.   Subjective Assessment - 11/21/19 1231    Subjective I've been trying to work on how I am sitting down    Currently in Pain? No/denies                             [facility] Adult PT Treatment/Exercise - 11/21/19 0001      Knee/Hip Exercises: Aerobic   Nustep L2 x 8 min    verbal cues required for full ROM and leg movement     Knee/Hip Exercises: Standing   Heel Raises 20 reps      Knee/Hip Exercises: Seated   Long Arc Quad Strengthening;Both;10 reps;2 sets    Long Arc Quad Weight 3 lbs.    Ball Squeeze 2x10    Clamshell with TheraBand Red    Hamstring Curl Strengthening;Both;2 sets;10 reps    Hamstring Limitations red theraband  Sit to Sand 2 sets;5 reps;without UE support   max verbal cueing required                   PT Short Term Goals - 11/19/19 1023      PT SHORT TERM GOAL #1   Title be independent in initial HEP    Status Achieved      PT SHORT TERM GOAL #2   Title Pt will improve 5x sit to stand in less than or equal to 25  seconds to demonstrate improved functional strength.    Baseline 17 with poor technique, 35 with improved technique and max cueing for technique    Time 4    Period Weeks    Status On-going      PT SHORT TERM GOAL #3   Title Pt will improve TUG score to less than or equal to 25 seconds    Baseline 22.88 without walker    Status Achieved      PT SHORT TERM GOAL #4   Title stand with erect posture > or = to 50% of the time without cueing    Baseline this requires frequent verbal cues, this has improved by 30%    Status On-going             PT Long Term  Goals - 11/19/19 1024      PT LONG TERM GOAL #1   Title be independent in advanced HEP    Time 8    Period Weeks    Status On-going                 Plan - 11/21/19 1156    Clinical Impression Statement The patient continues to require max cues to remain focused on task.  Her TUG and 5x sit to stand times are overall improved this week.  Pt has poor technique with sit to stand and requires max tactile, verbal and demo cues for erect posture with standing and to control descent with stand to sit.  Pt with increased anxiety with standing activity today so majority of exercise was performed in sitting.  Session spent working on building functional strength and safe movement.  Follow through and cognition limit pt's ability to have steady gains with PT so progress has been slow.  Pt does stand erect 25% of the time after cueing. She may be slower to achieve goals or limited in achievement secondary to these factors.    PT Frequency 2x / week    PT Duration 8 weeks    PT Treatment/Interventions ADLs/Self Care Home Management;Cryotherapy;Gait training;Stair training;Functional mobility training;Neuromuscular re-education;Balance training;Therapeutic exercise;Therapeutic activities;Patient/family education;Manual techniques;Passive range of motion    PT Next Visit Plan work on sit to stand, endurance, strength    PT Home Exercise Plan Access Code: [email or web address]    Consulted and Agree with Plan of Care Patient           Patient will benefit from skilled therapeutic intervention in order to improve the following deficits and impairments:  Abnormal gait, Decreased activity tolerance, Decreased balance, Decreased endurance, Decreased knowledge of precautions, Decreased mobility, Decreased range of motion, Decreased strength, Impaired flexibility, Pain, Improper body mechanics, Difficulty walking  Visit Diagnosis: Other abnormalities of gait and mobility  Unsteadiness on feet  Muscle weakness  (generalized)     Problem List Patient Active Problem List   Diagnosis Date Noted  . Torn ear lobe, right, initial encounter 02/14/2018  . Gait abnormality 08/15/2017  . Memory loss 08/15/2017  .  Chronic low back pain 07/01/2016  . Abnormality of gait 05/12/2016  . Paresthesia 05/12/2016  . OA (osteoarthritis) of knee 07/07/2014    Sigurd Sos, PT 11/21/19 12:33 PM  [facility] [address] [place], [place], [address] Phone: [phone]   Fax:  [phone]  Name: [name] MRN: [id number] Date of Birth: [birth year]

## 2019-11-26 ENCOUNTER — Ambulatory Visit: Payer: Medicare Other

## 2019-11-26 ENCOUNTER — Other Ambulatory Visit: Payer: Self-pay

## 2019-11-26 DIAGNOSIS — R2689 Other abnormalities of gait and mobility: Secondary | ICD-10-CM | POA: Diagnosis not present

## 2019-11-26 DIAGNOSIS — R2681 Unsteadiness on feet: Secondary | ICD-10-CM

## 2019-11-26 DIAGNOSIS — M6281 Muscle weakness (generalized): Secondary | ICD-10-CM

## 2019-11-26 NOTE — Therapy (Signed)
[facility] [address] [place], [place], [address] Phone: [phone]   Fax:  [phone]  Physical Therapy Treatment  Patient Details  Name: [name] MRN: [id number] Date of Birth: 04-02-1946 Referring Provider (PT): Sela Hilding, MD   Encounter Date: 11/26/2019   PT End of Session - 11/26/19 1102    Visit Number 12    Date for PT Re-Evaluation 12/09/19    Authorization Type Medicare BCBS    PT Start Time 1017    PT Stop Time 1058    PT Time Calculation (min) 41 min    Activity Tolerance Patient tolerated treatment well    Behavior During Therapy [facility] for tasks assessed/performed           Past Medical History:  Diagnosis Date  . Anxiety   . Bipolar 1 disorder ([place])    followed by leslie o'neal NP  . DDD (degenerative disc disease), cervical   . Depression   . Diabetic retinopathy ([place])    mild right eye per pt  . History of asthma    childhood  . History of duodenal ulcer 1989   remote  . History of external beam radiation therapy    1995  left breast  . History of left breast cancer    1995  s/p  breast lumpectomy and radiation therapy  . Hyperlipidemia   . Hypertension   . OA (osteoarthritis)    hands  . PMB (postmenopausal bleeding)   . Type 2 diabetes mellitus treated with insulin [facility])    endocrinologist-- dr Chalmers Cater--- fasting sugar 140 (7 day average),  checks 4-5 times daily  . Urge urinary incontinence   . Wears glasses     Past Surgical History:  Procedure Laterality Date  . BREAST LUMPECTOMY  1995   left breast / radiation  . DILATION AND CURETTAGE OF UTERUS  1989  . HYSTEROSCOPY WITH D & C N/A 02/06/2019   Procedure: DILATATION AND CURETTAGE /HYSTEROSCOPY;  Surgeon: [name], MD;  Location: [place];  Service: Gynecology;  Laterality: N/A;  . KNEE ARTHROSCOPY Right 2015  . TONSILLECTOMY  child  . TOTAL KNEE ARTHROPLASTY Right 07/07/2014    Procedure: RIGHT TOTAL KNEE ARTHROPLASTY;  Surgeon: [name], MD;  [facility];  Service: Orthopedics;  Laterality: Right;  . TOTAL KNEE ARTHROPLASTY Left 07/06/2015   Procedure: LEFT TOTAL KNEE ARTHROPLASTY;  Surgeon: Gaynelle Arabian, MD;  Location: [facility];  Service: Orthopedics;  Laterality: Left;    There were no vitals filed for this visit.                      [facility] Adult PT Treatment/Exercise - 11/26/19 0001      Knee/Hip Exercises: Aerobic   Nustep L2 x 8 min    verbal cues required for full ROM and leg movement     Knee/Hip Exercises: Seated   Clamshell with TheraBand Red   red loop  3x 30 seconds   Marching Strengthening;Right;Left;Weights;20 reps    Marching Limitations using red loop 3x30 seconds    Sit to Sand 5 reps;without UE support   max verbal cueing required     Knee/Hip Exercises: Supine   Other Supine Knee/Hip Exercises shoulder flexion 1# 3x30 seconds, ball diagonals blue weighted ball (1000 gr) 3x 30 seconds each                    PT Short Term Goals -  11/19/19 1023      PT SHORT TERM GOAL #1   Title be independent in initial HEP    Status Achieved      PT SHORT TERM GOAL #2   Title Pt will improve 5x sit to stand in less than or equal to 25  seconds to demonstrate improved functional strength.    Baseline 17 with poor technique, 35 with improved technique and max cueing for technique    Time 4    Period Weeks    Status On-going      PT SHORT TERM GOAL #3   Title Pt will improve TUG score to less than or equal to 25 seconds    Baseline 22.88 without walker    Status Achieved      PT SHORT TERM GOAL #4   Title stand with erect posture > or = to 50% of the time without cueing    Baseline this requires frequent verbal cues, this has improved by 30%    Status On-going             PT Long Term Goals - 11/19/19 1024      PT LONG TERM GOAL #1   Title be independent in advanced HEP    Time 8    Period Weeks      Status On-going                 Plan - 11/26/19 1035    Clinical Impression Statement The patient continues to require max cues to remain focused on task. Timer was used to time each task instead of counting reps.  Her TUG and 5x sit to stand times are overall improved this week.  Pt with increased anxiety with standing activity today so majority of exercise was performed in sitting.  Pt requires verbal cues for technique with sit to stand.  Session spent working on building functional strength and safe movement.  Follow through and cognition limit pt's ability to have steady gains with PT so progress has been slow.  Pt does stand erect 25% of the time after cueing. She may be slower to achieve goals or limited in achievement secondary to these factors.    PT Frequency 2x / week    PT Duration 8 weeks    PT Treatment/Interventions ADLs/Self Care Home Management;Cryotherapy;Gait training;Stair training;Functional mobility training;Neuromuscular re-education;Balance training;Therapeutic exercise;Therapeutic activities;Patient/family education;Manual techniques;Passive range of motion    PT Next Visit Plan work on sit to stand, endurance, strength    PT Home Exercise Plan Access Code: [email or web address]    Recommended Other Services initial cert is signed    Consulted and Agree with Plan of Care Patient           Patient will benefit from skilled therapeutic intervention in order to improve the following deficits and impairments:  Abnormal gait, Decreased activity tolerance, Decreased balance, Decreased endurance, Decreased knowledge of precautions, Decreased mobility, Decreased range of motion, Decreased strength, Impaired flexibility, Pain, Improper body mechanics, Difficulty walking  Visit Diagnosis: Other abnormalities of gait and mobility  Unsteadiness on feet  Muscle weakness (generalized)     Problem List Patient Active Problem List   Diagnosis Date Noted  . Torn ear lobe, right,  initial encounter 02/14/2018  . Gait abnormality 08/15/2017  . Memory loss 08/15/2017  . Chronic low back pain 07/01/2016  . Abnormality of gait 05/12/2016  . Paresthesia 05/12/2016  . OA (osteoarthritis) of knee 07/07/2014     Sigurd Sos, PT 11/26/19 11:11 AM  [facility] [address] [place], [place], [address] Phone: [phone]   Fax:  [phone]  Name: [name] MRN: [id number] Date of Birth: 10-11-1945

## 2019-11-28 ENCOUNTER — Ambulatory Visit: Payer: Medicare Other

## 2019-11-28 ENCOUNTER — Other Ambulatory Visit: Payer: Self-pay

## 2019-11-28 DIAGNOSIS — M6281 Muscle weakness (generalized): Secondary | ICD-10-CM

## 2019-11-28 DIAGNOSIS — R2689 Other abnormalities of gait and mobility: Secondary | ICD-10-CM | POA: Diagnosis not present

## 2019-11-28 DIAGNOSIS — R2681 Unsteadiness on feet: Secondary | ICD-10-CM

## 2019-11-28 NOTE — Therapy (Signed)
[facility] [address], [place], [place], [address] Phone: [phone]   Fax:  [phone]  Physical Therapy Treatment  Patient Details  Name: [name] MRN: [id number] Date of Birth: 09/11/1945 Referring Provider (PT): Sela Hilding, MD   Encounter Date: 11/28/2019   PT End of Session - 11/28/19 1058    Visit Number 13    Date for PT Re-Evaluation 02/20/20    Authorization Type Medicare BCBS    PT Start Time 1018    PT Stop Time 1102    PT Time Calculation (min) 44 min    Activity Tolerance Patient tolerated treatment well    Behavior During Therapy [facility] for tasks assessed/performed           Past Medical History:  Diagnosis Date   Anxiety    Bipolar 1 disorder ([place])    followed by Debbora Dus NP   DDD (degenerative disc disease), cervical    Depression    Diabetic retinopathy ([place])    mild right eye per pt   History of asthma    childhood   History of duodenal ulcer 1989   remote   History of external beam radiation therapy    1995  left breast   History of left breast cancer    1995  s/p  breast lumpectomy and radiation therapy   Hyperlipidemia    Hypertension    OA (osteoarthritis)    hands   PMB (postmenopausal bleeding)    Type 2 diabetes mellitus treated with insulin [facility])    endocrinologist-- dr Chalmers Cater--- fasting sugar 140 (7 day average),  checks 4-5 times daily   Urge urinary incontinence    Wears glasses     Past Surgical History:  Procedure Laterality Date   BREAST LUMPECTOMY  1995   left breast / radiation   DILATION AND CURETTAGE OF UTERUS  1989   HYSTEROSCOPY WITH D & C N/A 02/06/2019   Procedure: DILATATION AND CURETTAGE /HYSTEROSCOPY;  Surgeon: [name], MD;  Location: [facility];  Service: Gynecology;  Laterality: N/A;   KNEE ARTHROSCOPY Right 2015   TONSILLECTOMY  child   TOTAL KNEE ARTHROPLASTY Right 07/07/2014    Procedure: RIGHT TOTAL KNEE ARTHROPLASTY;  Surgeon: [name], MD;  [facility];  Service: Orthopedics;  Laterality: Right;   TOTAL KNEE ARTHROPLASTY Left 07/06/2015   Procedure: LEFT TOTAL KNEE ARTHROPLASTY;  Surgeon: Gaynelle Arabian, MD;  [facility];  Service: Orthopedics;  Laterality: Left;    There were no vitals filed for this visit.       [facility] PT Assessment - 11/28/19 0001      Assessment   Medical Diagnosis frequent falls    Referring Provider (PT) Sela Hilding, MD    Next MD Visit none      Precautions   Precautions Fall      Balance Screen   Has the patient fallen in the past 6 months No    Has the patient had a decrease in activity level because of a fear of falling?  No    Is the patient reluctant to leave their home because of a fear of falling?  No      Home Ecologist residence    Living Arrangements Spouse/significant other      Prior Function   Level of [place] Retired      Associate Professor   Overall Cognitive Status Within Functional  Limits for tasks assessed      Posture/Postural Control   Posture/Postural Control Postural limitations    Postural Limitations Rounded Shoulders;Flexed trunk      Transfers   Sit to Stand 4: Min guard    Five time sit to stand comments  23 seconds without hands      Ambulation/Gait   Ambulation/Gait Yes    Gait Pattern Step-through pattern;Decreased step length - right;Decreased step length - left;Decreased dorsiflexion - right;Decreased dorsiflexion - left;Right flexed knee in stance;Left flexed knee in stance;Trunk flexed;Wide base of support;Poor foot clearance - left;Poor foot clearance - right    Gait velocity slow mobility    Gait Comments flexed knees bilaterally      Timed Up and Go Test   TUG Normal TUG    Normal TUG (seconds) 25.05    TUG Comments no walker- walker confuses pt                          [facility] Adult PT  Treatment/Exercise - 11/28/19 0001      Knee/Hip Exercises: Aerobic   Nustep L2 x 8 min    verbal cues required for full ROM and leg movement     Knee/Hip Exercises: Seated   Clamshell with TheraBand Red   red loop  3x 30 seconds   Marching Strengthening;Right;Left;Weights;20 reps    Marching Limitations using red loop 3x30 seconds    Sit to Sand 5 reps;without UE support   max verbal cueing required     Knee/Hip Exercises: Supine   Other Supine Knee/Hip Exercises shoulder flexion 1# 3x30 seconds, ball diagonals blue weighted ball (1000 gr) 3x 30 seconds each                  PT Education - 11/28/19 1056    Education Details Access Code: [email or web address]    Person(s) Educated Patient    Methods Explanation;Demonstration;Handout    Comprehension Verbalized understanding;Returned demonstration            PT Short Term Goals - 11/19/19 1023      PT SHORT TERM GOAL #1   Title be independent in initial HEP    Status Achieved      PT SHORT TERM GOAL #2   Title Pt will improve 5x sit to stand in less than or equal to 25  seconds to demonstrate improved functional strength.    Baseline 17 with poor technique, 35 with improved technique and max cueing for technique    Time 4    Period Weeks    Status On-going      PT SHORT TERM GOAL #3   Title Pt will improve TUG score to less than or equal to 25 seconds    Baseline 22.88 without walker    Status Achieved      PT SHORT TERM GOAL #4   Title stand with erect posture > or = to 50% of the time without cueing    Baseline this requires frequent verbal cues, this has improved by 30%    Status On-going             PT Long Term Goals - 11/28/19 1020      PT LONG TERM GOAL #1   Title be independent in advanced HEP    Time 12    Period Weeks    Status On-going    Target Date 02/20/20      PT LONG TERM GOAL #2  Title Pt will perform 5x sit to stand in less than or equal to 19  seconds for improved functional lower  extremity strength.    Baseline 26.89 seconds    Time 12    Period Weeks    Status On-going    Target Date 02/20/20      PT LONG TERM GOAL #3   Title perform TUG in < or = to 20 seconds to improve safety and balance    Baseline 25.05 seconds    Time 12    Period Weeks    Status On-going    Target Date 02/20/20      PT LONG TERM GOAL #4   Title perform sit to stand without UE support due to improved functional LE strength    Baseline still requires min to mod UE support    Time 12    Period Weeks    Status On-going    Target Date 02/20/20                 Plan - 11/28/19 1048    Clinical Impression Statement Pt is making progress although it is somewhat slower due to heightened anxiety at times and inability to stay on task during treatment sessions.  Pt has shown improvement in 5x sit to stand (26.89 seconds) and TUG without walker (25.05 seconds).  Although times are improved, pt still remains unbalanced and at risk for falls.  Pt ambulates with a rolling walker and requires less frequent cues for posture and to improve knee extension.  Pt requires frequent cueing to stay on tasks and using a timer for exercise helps with this.  Pt will continue to benefit from skilled PT to improve strength, gait and balance to improve safety and independence at home and in the community.    PT Frequency 2x / week    PT Duration 12 weeks    PT Treatment/Interventions ADLs/Self Care Home Management;Cryotherapy;Gait training;Stair training;Functional mobility training;Neuromuscular re-education;Balance training;Therapeutic exercise;Therapeutic activities;Patient/family education;Manual techniques;Passive range of motion    PT Next Visit Plan work on sit to stand, endurance, strength, gait    PT Home Exercise Plan Access Code: [email or web address]    Recommended Other Services recert sent 9/62/22    Consulted and Agree with Plan of Care Patient           Patient will benefit from skilled therapeutic  intervention in order to improve the following deficits and impairments:  Abnormal gait, Decreased activity tolerance, Decreased balance, Decreased endurance, Decreased knowledge of precautions, Decreased mobility, Decreased range of motion, Decreased strength, Impaired flexibility, Pain, Improper body mechanics, Difficulty walking  Visit Diagnosis: Other abnormalities of gait and mobility  Unsteadiness on feet  Muscle weakness (generalized)     Problem List Patient Active Problem List   Diagnosis Date Noted   Torn ear lobe, right, initial encounter 02/14/2018   Gait abnormality 08/15/2017   Memory loss 08/15/2017   Chronic low back pain 07/01/2016   Abnormality of gait 05/12/2016   Paresthesia 05/12/2016   OA (osteoarthritis) of knee 07/07/2014     Sigurd Sos, PT 11/28/19 10:59 AM  [facility] [address] [place], [place], [address] Phone: [phone]   Fax:  [phone]  Name: KATHLINE BANBURY MRN: [id number] Date of Birth: 03-26-1946

## 2019-11-28 NOTE — Patient Instructions (Signed)
Access Code: [email or web address] URL: [email or web address] Date: 11/28/2019 Prepared by: Claiborne Billings  Exercises   Seated Shoulder Flexion with Dumbbells - 2 x daily - 7 x weekly - 2 sets - 10 reps Seated Diagonal Chop with Medicine Ball - 2 x daily - 7 x weekly - 2 sets - 10 reps Seated Hip Abduction with Resistance - 2 x daily - 7 x weekly - 2 sets - 10 reps

## 2019-12-02 ENCOUNTER — Other Ambulatory Visit: Payer: Self-pay

## 2019-12-02 ENCOUNTER — Ambulatory Visit: Payer: Medicare Other | Admitting: Physical Therapy

## 2019-12-02 ENCOUNTER — Encounter: Payer: Self-pay | Admitting: Physical Therapy

## 2019-12-02 DIAGNOSIS — R2689 Other abnormalities of gait and mobility: Secondary | ICD-10-CM | POA: Diagnosis not present

## 2019-12-02 DIAGNOSIS — M6281 Muscle weakness (generalized): Secondary | ICD-10-CM

## 2019-12-02 DIAGNOSIS — R2681 Unsteadiness on feet: Secondary | ICD-10-CM

## 2019-12-02 NOTE — Therapy (Signed)
[facility] [address], [place], [place], [address] Phone: [phone]   Fax:  [phone]  Physical Therapy Treatment  Patient Details  Name: [name] MRN: [id number] Date of Birth: 12/29/1945 Referring Provider (PT): Sela Hilding, MD   Encounter Date: 12/02/2019   PT End of Session - 12/02/19 1153    Visit Number 14    Date for PT Re-Evaluation 02/20/20    Authorization Type Medicare BCBS    PT Start Time 1143    PT Stop Time 1227    PT Time Calculation (min) 44 min    Activity Tolerance Patient tolerated treatment well    Behavior During Therapy [facility] for tasks assessed/performed           Past Medical History:  Diagnosis Date  . Anxiety   . Bipolar 1 disorder ([place])    followed by leslie o'neal NP  . DDD (degenerative disc disease), cervical   . Depression   . Diabetic retinopathy ([place])    mild right eye per pt  . History of asthma    childhood  . History of duodenal ulcer 1989   remote  . History of external beam radiation therapy    1995  left breast  . History of left breast cancer    1995  s/p  breast lumpectomy and radiation therapy  . Hyperlipidemia   . Hypertension   . OA (osteoarthritis)    hands  . PMB (postmenopausal bleeding)   . Type 2 diabetes mellitus treated with insulin [facility])    endocrinologist-- dr Chalmers Cater--- fasting sugar 140 (7 day average),  checks 4-5 times daily  . Urge urinary incontinence   . Wears glasses     Past Surgical History:  Procedure Laterality Date  . BREAST LUMPECTOMY  1995   left breast / radiation  . DILATION AND CURETTAGE OF UTERUS  1989  . HYSTEROSCOPY WITH D & C N/A 02/06/2019   Procedure: DILATATION AND CURETTAGE /HYSTEROSCOPY;  Surgeon: [name], MD;  Location: [place];  Service: Gynecology;  Laterality: N/A;  . KNEE ARTHROSCOPY Right 2015  . TONSILLECTOMY  child  . TOTAL KNEE ARTHROPLASTY Right 07/07/2014    Procedure: RIGHT TOTAL KNEE ARTHROPLASTY;  Surgeon: [name], MD;  [facility];  Service: Orthopedics;  Laterality: Right;  . TOTAL KNEE ARTHROPLASTY Left 07/06/2015   Procedure: LEFT TOTAL KNEE ARTHROPLASTY;  Surgeon: Gaynelle Arabian, MD;  [facility];  Service: Orthopedics;  Laterality: Left;    There were no vitals filed for this visit.   Subjective Assessment - 12/02/19 1154    Subjective I did not do my home exercises but I did ride my bike.    Pertinent History balance deficits    Currently in Pain? No/denies                             [facility] Adult PT Treatment/Exercise - 12/02/19 0001      Knee/Hip Exercises: Aerobic   Nustep L2 x 8 min    verbal cues required for full ROM and leg movement     Knee/Hip Exercises: Seated   Long Arc Quad Strengthening;Both;1 set;10 reps;Weights    Long Arc Quad Weight 2 lbs.    Long Arc Quad Limitations VC for > knee extension than hip flexion    Clamshell with TheraBand --   Red loop  2x10, TC to stay on task  Marching Strengthening;Right;Left;Weights;20 reps    Marching Limitations green loop to increase  ROM, TC to keep on task with exercise    Sit to Sand 10 reps;with UE support   VC for arm placement/safety                   PT Short Term Goals - 11/19/19 1023      PT SHORT TERM GOAL #1   Title be independent in initial HEP    Status Achieved      PT SHORT TERM GOAL #2   Title Pt will improve 5x sit to stand in less than or equal to 25  seconds to demonstrate improved functional strength.    Baseline 17 with poor technique, 35 with improved technique and max cueing for technique    Time 4    Period Weeks    Status On-going      PT SHORT TERM GOAL #3   Title Pt will improve TUG score to less than or equal to 25 seconds    Baseline 22.88 without walker    Status Achieved      PT SHORT TERM GOAL #4   Title stand with erect posture > or = to 50% of the time without cueing    Baseline  this requires frequent verbal cues, this has improved by 30%    Status On-going             PT Long Term Goals - 11/28/19 1020      PT LONG TERM GOAL #1   Title be independent in advanced HEP    Time 12    Period Weeks    Status On-going    Target Date 02/20/20      PT LONG TERM GOAL #2   Title Pt will perform 5x sit to stand in less than or equal to 19  seconds for improved functional lower extremity strength.    Baseline 26.89 seconds    Time 12    Period Weeks    Status On-going    Target Date 02/20/20      PT LONG TERM GOAL #3   Title perform TUG in < or = to 20 seconds to improve safety and balance    Baseline 25.05 seconds    Time 12    Period Weeks    Status On-going    Target Date 02/20/20      PT LONG TERM GOAL #4   Title perform sit to stand without UE support due to improved functional LE strength    Baseline still requires min to mod UE support    Time 12    Period Weeks    Status On-going    Target Date 02/20/20                 Plan - 12/02/19 1153    Clinical Impression Statement Pt arrives ambulating slowly ith flexed posture. She continues to have difficulty staying on task and needs initial tactile cuing to get the motion of the exercise started. Pt showed good progress throughout the session regarding sit to stand safety and using her LE more than pulling with her UE.    Personal Factors and Comorbidities Comorbidity 2    Comorbidities chronic balance deficts, bipolar disorder, HTN    Examination-Activity Limitations Locomotion Level;Bathing;Squat;Stairs;Stand;Transfers    Examination-Participation Restrictions Driving;Community Activity;Cleaning;Shop    Stability/Clinical Decision Making Evolving/Moderate complexity    Rehab Potential Good    PT Frequency 2x / week  PT Duration 12 weeks    PT Treatment/Interventions ADLs/Self Care Home Management;Cryotherapy;Gait training;Stair training;Functional mobility training;Neuromuscular  re-education;Balance training;Therapeutic exercise;Therapeutic activities;Patient/family education;Manual techniques;Passive range of motion    PT Next Visit Plan work on sit to stand, endurance, strength, gait    PT Home Exercise Plan Access Code: [email or web address]    Consulted and Agree with Plan of Care Patient           Patient will benefit from skilled therapeutic intervention in order to improve the following deficits and impairments:  Abnormal gait, Decreased activity tolerance, Decreased balance, Decreased endurance, Decreased knowledge of precautions, Decreased mobility, Decreased range of motion, Decreased strength, Impaired flexibility, Pain, Improper body mechanics, Difficulty walking  Visit Diagnosis: Other abnormalities of gait and mobility  Unsteadiness on feet  Muscle weakness (generalized)     Problem List Patient Active Problem List   Diagnosis Date Noted  . Torn ear lobe, right, initial encounter 02/14/2018  . Gait abnormality 08/15/2017  . Memory loss 08/15/2017  . Chronic low back pain 07/01/2016  . Abnormality of gait 05/12/2016  . Paresthesia 05/12/2016  . OA (osteoarthritis) of knee 07/07/2014    Kaysha Parsell, PTA 12/02/2019, 12:32 PM  [facility] [address] [place], [place], [address] Phone: [phone]   Fax:  [phone]  Name: [name] MRN: [id number] Date of Birth: Jan 12, 1946

## 2019-12-04 ENCOUNTER — Encounter: Payer: Self-pay | Admitting: Physical Therapy

## 2019-12-04 ENCOUNTER — Ambulatory Visit: Payer: Medicare Other | Admitting: Physical Therapy

## 2019-12-04 ENCOUNTER — Other Ambulatory Visit: Payer: Self-pay

## 2019-12-04 DIAGNOSIS — R2689 Other abnormalities of gait and mobility: Secondary | ICD-10-CM | POA: Diagnosis not present

## 2019-12-04 DIAGNOSIS — M6281 Muscle weakness (generalized): Secondary | ICD-10-CM

## 2019-12-04 DIAGNOSIS — R2681 Unsteadiness on feet: Secondary | ICD-10-CM

## 2019-12-04 NOTE — Therapy (Signed)
[facility] [address] [place], [place], [address] Phone: [phone]   Fax:  [phone]  Physical Therapy Treatment  Patient Details  Name: [name] MRN: [id number] Date of Birth: [birth year] Referring Provider (PT): Sela Hilding, MD   Encounter Date: 12/04/2019   PT End of Session - 12/04/19 1229    Visit Number 15    Date for PT Re-Evaluation 02/20/20    Authorization Type Medicare BCBS    PT Start Time 1229    PT Stop Time 1311    PT Time Calculation (min) 42 min    Activity Tolerance Patient tolerated treatment well    Behavior During Therapy [facility] for tasks assessed/performed           Past Medical History:  Diagnosis Date  . Anxiety   . Bipolar 1 disorder ([place])    followed by leslie o'neal NP  . DDD (degenerative disc disease), cervical   . Depression   . Diabetic retinopathy ([place])    mild right eye per pt  . History of asthma    childhood  . History of duodenal ulcer 1989   remote  . History of external beam radiation therapy    1995  left breast  . History of left breast cancer    1995  s/p  breast lumpectomy and radiation therapy  . Hyperlipidemia   . Hypertension   . OA (osteoarthritis)    hands  . PMB (postmenopausal bleeding)   . Type 2 diabetes mellitus treated with insulin [facility])    endocrinologist-- dr Chalmers Cater--- fasting sugar 140 (7 day average),  checks 4-5 times daily  . Urge urinary incontinence   . Wears glasses     Past Surgical History:  Procedure Laterality Date  . BREAST LUMPECTOMY  1995   left breast / radiation  . DILATION AND CURETTAGE OF UTERUS  1989  . HYSTEROSCOPY WITH D & C N/A 02/06/2019   Procedure: DILATATION AND CURETTAGE /HYSTEROSCOPY;  Surgeon: [name], MD;  Location: [place];  Service: Gynecology;  Laterality: N/A;  . KNEE ARTHROSCOPY Right 2015  . TONSILLECTOMY  child  . TOTAL KNEE ARTHROPLASTY Right 07/07/2014    Procedure: RIGHT TOTAL KNEE ARTHROPLASTY;  Surgeon: [name], MD;  [facility];  Service: Orthopedics;  Laterality: Right;  . TOTAL KNEE ARTHROPLASTY Left 07/06/2015   Procedure: LEFT TOTAL KNEE ARTHROPLASTY;  Surgeon: Gaynelle Arabian, MD;  [facility];  Service: Orthopedics;  Laterality: Left;    There were no vitals filed for this visit.   Subjective Assessment - 12/04/19 1230    Subjective No new complaints, not doing HEP. Pt reports being able to stand for 3 hours in the kitchen prepping peaches to freezing.    Pertinent History balance deficits    Currently in Pain? No/denies    Multiple Pain Sites No              [facility] PT Assessment - 12/04/19 0001      Timed Up and Go Test   TUG Normal TUG    Normal TUG (seconds) 50    TUG Comments with walker secondary end of session and LE were fatigued                         [facility] Adult PT Treatment/Exercise - 12/04/19 0001      Therapeutic Activites    Therapeutic Activities ADL's    ADL's Delorise Shiner  on safety with sit to stand to prevent falling into her chair which she initially demonstrated in the beginning of todays tretament. rRepeated  3x       Knee/Hip Exercises: Aerobic   Nustep L2 x 10 min    verbal cues required for full ROM and leg movement     Knee/Hip Exercises: Standing   Heel Raises Both;2 sets;10 reps    Heel Raises Limitations Holding onto walker      Knee/Hip Exercises: Seated   Long Arc Quad Strengthening;Both;2 sets;10 reps;Weights    Long Arc Quad Weight 2 lbs.    Long Arc Quad Limitations TC to keep from too much hip fllexion                    PT Short Term Goals - 11/19/19 1023      PT SHORT TERM GOAL #1   Title be independent in initial HEP    Status Achieved      PT SHORT TERM GOAL #2   Title Pt will improve 5x sit to stand in less than or equal to 25  seconds to demonstrate improved functional strength.    Baseline 17 with poor technique, 35 with improved  technique and max cueing for technique    Time 4    Period Weeks    Status On-going      PT SHORT TERM GOAL #3   Title Pt will improve TUG score to less than or equal to 25 seconds    Baseline 22.88 without walker    Status Achieved      PT SHORT TERM GOAL #4   Title stand with erect posture > or = to 50% of the time without cueing    Baseline this requires frequent verbal cues, this has improved by 30%    Status On-going             PT Long Term Goals - 11/28/19 1020      PT LONG TERM GOAL #1   Title be independent in advanced HEP    Time 12    Period Weeks    Status On-going    Target Date 02/20/20      PT LONG TERM GOAL #2   Title Pt will perform 5x sit to stand in less than or equal to 19  seconds for improved functional lower extremity strength.    Baseline 26.89 seconds    Time 12    Period Weeks    Status On-going    Target Date 02/20/20      PT LONG TERM GOAL #3   Title perform TUG in < or = to 20 seconds to improve safety and balance    Baseline 25.05 seconds    Time 12    Period Weeks    Status On-going    Target Date 02/20/20      PT LONG TERM GOAL #4   Title perform sit to stand without UE support due to improved functional LE strength    Baseline still requires min to mod UE support    Time 12    Period Weeks    Status On-going    Target Date 02/20/20                 Plan - 12/04/19 1229    Clinical Impression Statement Pt initially sat into chair with no control and almost missed he chair. We dedicated the first half of the treatment to developing her safe ritual  for sitting down. After some repetition pt demonstrated improved safety habits. TUG with walker was 50 sec at the end of session.    Personal Factors and Comorbidities Comorbidity 2    Comorbidities chronic balance deficts, bipolar disorder, HTN    Examination-Activity Limitations Locomotion Level;Bathing;Squat;Stairs;Stand;Transfers    Examination-Participation Restrictions  Driving;Community Activity;Cleaning;Shop    Stability/Clinical Decision Making Evolving/Moderate complexity    Rehab Potential Good    PT Frequency 2x / week    PT Duration 12 weeks    PT Treatment/Interventions ADLs/Self Care Home Management;Cryotherapy;Gait training;Stair training;Functional mobility training;Neuromuscular re-education;Balance training;Therapeutic exercise;Therapeutic activities;Patient/family education;Manual techniques;Passive range of motion    PT Next Visit Plan work on sit to stand, endurance, strength, gait    PT Home Exercise Plan Access Code: [email or web address]    Consulted and Agree with Plan of Care Patient           Patient will benefit from skilled therapeutic intervention in order to improve the following deficits and impairments:  Abnormal gait, Decreased activity tolerance, Decreased balance, Decreased endurance, Decreased knowledge of precautions, Decreased mobility, Decreased range of motion, Decreased strength, Impaired flexibility, Pain, Improper body mechanics, Difficulty walking  Visit Diagnosis: Other abnormalities of gait and mobility  Unsteadiness on feet  Muscle weakness (generalized)     Problem List Patient Active Problem List   Diagnosis Date Noted  . Torn ear lobe, right, initial encounter 02/14/2018  . Gait abnormality 08/15/2017  . Memory loss 08/15/2017  . Chronic low back pain 07/01/2016  . Abnormality of gait 05/12/2016  . Paresthesia 05/12/2016  . OA (osteoarthritis) of knee 07/07/2014    [name], PTA 12/04/2019, 1:53 PM  [facility] [address], [place], [place], [address] Phone: [phone]   Fax:  [phone]  Name: [name] MRN: [id number] Date of Birth: Nov 23, 1945

## 2019-12-18 ENCOUNTER — Other Ambulatory Visit: Payer: Self-pay

## 2019-12-18 ENCOUNTER — Ambulatory Visit: Payer: Medicare Other | Attending: Family Medicine | Admitting: Physical Therapy

## 2019-12-18 ENCOUNTER — Encounter: Payer: Self-pay | Admitting: Physical Therapy

## 2019-12-18 DIAGNOSIS — R2681 Unsteadiness on feet: Secondary | ICD-10-CM | POA: Insufficient documentation

## 2019-12-18 DIAGNOSIS — R2689 Other abnormalities of gait and mobility: Secondary | ICD-10-CM | POA: Diagnosis present

## 2019-12-18 DIAGNOSIS — M6281 Muscle weakness (generalized): Secondary | ICD-10-CM

## 2019-12-18 NOTE — Therapy (Signed)
[facility] [address], [place], [place], [address] Phone: [phone]   Fax:  [phone]  Physical Therapy Treatment  Patient Details  Name: [name] MRN: [id number] Date of Birth: [birth year] Referring Provider (PT): Sela Hilding, MD   Encounter Date: 12/18/2019   PT End of Session - 12/18/19 0935    Visit Number 16    Date for PT Re-Evaluation 02/20/20    Authorization Type Medicare BCBS    PT Start Time [phone]   pt spent extra time in bathroom at beginning of session   PT Stop Time 1010    PT Time Calculation (min) 42 min    Activity Tolerance Patient tolerated treatment well    Behavior During Therapy [facility] for tasks assessed/performed           Past Medical History:  Diagnosis Date  . Anxiety   . Bipolar 1 disorder ([place])    followed by leslie o'neal NP  . DDD (degenerative disc disease), cervical   . Depression   . Diabetic retinopathy ([place])    mild right eye per pt  . History of asthma    childhood  . History of duodenal ulcer 1989   remote  . History of external beam radiation therapy    1995  left breast  . History of left breast cancer    1995  s/p  breast lumpectomy and radiation therapy  . Hyperlipidemia   . Hypertension   . OA (osteoarthritis)    hands  . PMB (postmenopausal bleeding)   . Type 2 diabetes mellitus treated with insulin [facility])    endocrinologist-- dr Chalmers Cater--- fasting sugar 140 (7 day average),  checks 4-5 times daily  . Urge urinary incontinence   . Wears glasses     Past Surgical History:  Procedure Laterality Date  . BREAST LUMPECTOMY  1995   left breast / radiation  . DILATION AND CURETTAGE OF UTERUS  1989  . HYSTEROSCOPY WITH D & C N/A 02/06/2019   Procedure: DILATATION AND CURETTAGE /HYSTEROSCOPY;  Surgeon: [name], MD;  Location: [place];  Service: Gynecology;  Laterality: N/A;  . KNEE ARTHROSCOPY Right 2015  .  TONSILLECTOMY  child  . TOTAL KNEE ARTHROPLASTY Right 07/07/2014   Procedure: RIGHT TOTAL KNEE ARTHROPLASTY;  Surgeon: [name], MD;  [facility];  Service: Orthopedics;  Laterality: Right;  . TOTAL KNEE ARTHROPLASTY Left 07/06/2015   Procedure: LEFT TOTAL KNEE ARTHROPLASTY;  Surgeon: Gaynelle Arabian, MD;  [facility];  Service: Orthopedics;  Laterality: Left;    There were no vitals filed for this visit.   Subjective Assessment - 12/18/19 0939    Subjective I am doing well, getting around the house and community ok.    Currently in Pain? No/denies              [facility] PT Assessment - 12/18/19 0001      Transfers   Five time sit to stand comments  39 sec with UE on rails and a full stand, pt could also perform in 20 sec but she did not stand all the way up.      Timed Up and Go Test   TUG Normal TUG    Normal TUG (seconds) --    TUG Comments --                         [facility] Adult PT Treatment/Exercise - 12/18/19 0001  Knee/Hip Exercises: Aerobic   Nustep L3 x 8 min PTA present      Knee/Hip Exercises: Standing   Gait Training 4 min walking with walker 2 laps of gym; Focus was on posture for duration of  distance      Knee/Hip Exercises: Seated   Long Arc Quad Strengthening;Both;2 sets;10 reps;Weights    Long Arc Quad Weight 3 lbs.    Clamshell with TheraBand --   Blue loop 20x, Vc for eccentrics   Marching --   Blue loop 20x                    PT Short Term Goals - 11/19/19 1023      PT SHORT TERM GOAL #1   Title be independent in initial HEP    Status Achieved      PT SHORT TERM GOAL #2   Title Pt will improve 5x sit to stand in less than or equal to 25  seconds to demonstrate improved functional strength.    Baseline 17 with poor technique, 35 with improved technique and max cueing for technique    Time 4    Period Weeks    Status On-going      PT SHORT TERM GOAL #3   Title Pt will improve TUG score to less than or  equal to 25 seconds    Baseline 22.88 without walker    Status Achieved      PT SHORT TERM GOAL #4   Title stand with erect posture > or = to 50% of the time without cueing    Baseline this requires frequent verbal cues, this has improved by 30%    Status On-going             PT Long Term Goals - 11/28/19 1020      PT LONG TERM GOAL #1   Title be independent in advanced HEP    Time 12    Period Weeks    Status On-going    Target Date 02/20/20      PT LONG TERM GOAL #2   Title Pt will perform 5x sit to stand in less than or equal to 19  seconds for improved functional lower extremity strength.    Baseline 26.89 seconds    Time 12    Period Weeks    Status On-going    Target Date 02/20/20      PT LONG TERM GOAL #3   Title perform TUG in < or = to 20 seconds to improve safety and balance    Baseline 25.05 seconds    Time 12    Period Weeks    Status On-going    Target Date 02/20/20      PT LONG TERM GOAL #4   Title perform sit to stand without UE support due to improved functional LE strength    Baseline still requires min to mod UE support    Time 12    Period Weeks    Status On-going    Target Date 02/20/20                 Plan - 12/18/19 0935    Clinical Impression Statement Pt had prolonged time in restroom to begin PT session. pt demonstrates signicicantly improved safety with sit to stand and reduced her 5x sit to stand time by 11 sec. Pt continues to use UE > LE. Pt could walk with her walker 4 min maintaining good posture. Pt was fatigued  after 4 min of walking requiring needing to sit.    Personal Factors and Comorbidities Comorbidity 2    Comorbidities chronic balance deficts, bipolar disorder, HTN    Examination-Activity Limitations Locomotion Level;Bathing;Squat;Stairs;Stand;Transfers    Examination-Participation Restrictions Driving;Community Activity;Cleaning;Shop    Stability/Clinical Decision Making Evolving/Moderate complexity    Rehab  Potential Good    PT Frequency 2x / week    PT Duration 12 weeks    PT Treatment/Interventions ADLs/Self Care Home Management;Cryotherapy;Gait training;Stair training;Functional mobility training;Neuromuscular re-education;Balance training;Therapeutic exercise;Therapeutic activities;Patient/family education;Manual techniques;Passive range of motion    PT Next Visit Plan work on sit to stand, endurance, strength, gait    PT Home Exercise Plan Access Code: [email or web address]    Consulted and Agree with Plan of Care Patient           Patient will benefit from skilled therapeutic intervention in order to improve the following deficits and impairments:  Abnormal gait, Decreased activity tolerance, Decreased balance, Decreased endurance, Decreased knowledge of precautions, Decreased mobility, Decreased range of motion, Decreased strength, Impaired flexibility, Pain, Improper body mechanics, Difficulty walking  Visit Diagnosis: Other abnormalities of gait and mobility  Unsteadiness on feet  Muscle weakness (generalized)     Problem List Patient Active Problem List   Diagnosis Date Noted  . Torn ear lobe, right, initial encounter 02/14/2018  . Gait abnormality 08/15/2017  . Memory loss 08/15/2017  . Chronic low back pain 07/01/2016  . Abnormality of gait 05/12/2016  . Paresthesia 05/12/2016  . OA (osteoarthritis) of knee 07/07/2014    Annis Lagoy, PTA 12/18/2019, 10:07 AM  [facility] [address], [place], [place], [address] Phone: [phone]   Fax:  [phone]  Name: [name] MRN: [id number] Date of Birth: 27-May-1945

## 2019-12-23 ENCOUNTER — Other Ambulatory Visit: Payer: Self-pay

## 2019-12-23 ENCOUNTER — Ambulatory Visit: Payer: Medicare Other

## 2019-12-23 DIAGNOSIS — R2689 Other abnormalities of gait and mobility: Secondary | ICD-10-CM | POA: Diagnosis not present

## 2019-12-23 DIAGNOSIS — M6281 Muscle weakness (generalized): Secondary | ICD-10-CM

## 2019-12-23 DIAGNOSIS — R2681 Unsteadiness on feet: Secondary | ICD-10-CM

## 2019-12-23 NOTE — Therapy (Signed)
[facility] [address], [place], [place], [address] Phone: [phone]   Fax:  [phone]  Physical Therapy Treatment  Patient Details  Name: [name] MRN: [id number] Date of Birth: Feb 07, 1946 Referring Provider (PT): Sela Hilding, MD   Encounter Date: 12/23/2019   PT End of Session - 12/23/19 1014    Visit Number 17    Date for PT Re-Evaluation 02/20/20    Authorization Type Medicare BCBS    PT Start Time 0930   9 minutes in the bathroom   PT Stop Time 6629    PT Time Calculation (min) 44 min    Activity Tolerance Patient tolerated treatment well    Behavior During Therapy [facility] for tasks assessed/performed           Past Medical History:  Diagnosis Date   Anxiety    Bipolar 1 disorder ([place])    followed by Debbora Dus NP   DDD (degenerative disc disease), cervical    Depression    Diabetic retinopathy ([place])    mild right eye per pt   History of asthma    childhood   History of duodenal ulcer 1989   remote   History of external beam radiation therapy    1995  left breast   History of left breast cancer    1995  s/p  breast lumpectomy and radiation therapy   Hyperlipidemia    Hypertension    OA (osteoarthritis)    hands   PMB (postmenopausal bleeding)    Type 2 diabetes mellitus treated with insulin [facility])    endocrinologist-- dr Chalmers Cater--- fasting sugar 140 (7 day average),  checks 4-5 times daily   Urge urinary incontinence    Wears glasses     Past Surgical History:  Procedure Laterality Date   BREAST LUMPECTOMY  1995   left breast / radiation   DILATION AND CURETTAGE OF UTERUS  1989   HYSTEROSCOPY WITH D & C N/A 02/06/2019   Procedure: DILATATION AND CURETTAGE /HYSTEROSCOPY;  Surgeon: [name], MD;  Location: [place];  Service: Gynecology;  Laterality: N/A;   KNEE ARTHROSCOPY Right 2015   TONSILLECTOMY  child   TOTAL KNEE  ARTHROPLASTY Right 07/07/2014   Procedure: RIGHT TOTAL KNEE ARTHROPLASTY;  Surgeon: [name], MD;  [facility];  Service: Orthopedics;  Laterality: Right;   TOTAL KNEE ARTHROPLASTY Left 07/06/2015   Procedure: LEFT TOTAL KNEE ARTHROPLASTY;  Surgeon: Gaynelle Arabian, MD;  [facility];  Service: Orthopedics;  Laterality: Left;    There were no vitals filed for this visit.   Subjective Assessment - 12/23/19 0934    Subjective She wore me out last time.    Currently in Pain? No/denies                             [facility] Adult PT Treatment/Exercise - 12/23/19 0001      Ambulation/Gait   Gait Comments walk around gym with walker x 5 minutes       Knee/Hip Exercises: Seated   Long Arc Quad Strengthening;Both;2 sets;10 reps;Weights    Long Arc Quad Weight 3 lbs.    Clamshell with TheraBand --   Blue loop 20x, Vc for eccentrics   Marching Strengthening;Right;Left;Weights;20 reps    Marching Limitations 3    Hamstring Curl Strengthening;Both;2 sets;10 reps    Hamstring Limitations blue loop    Abd/Adduction Limitations seated ball chops 2#  ball 2x1 minute                    PT Short Term Goals - 11/19/19 1023      PT SHORT TERM GOAL #1   Title be independent in initial HEP    Status Achieved      PT SHORT TERM GOAL #2   Title Pt will improve 5x sit to stand in less than or equal to 25  seconds to demonstrate improved functional strength.    Baseline 17 with poor technique, 35 with improved technique and max cueing for technique    Time 4    Period Weeks    Status On-going      PT SHORT TERM GOAL #3   Title Pt will improve TUG score to less than or equal to 25 seconds    Baseline 22.88 without walker    Status Achieved      PT SHORT TERM GOAL #4   Title stand with erect posture > or = to 50% of the time without cueing    Baseline this requires frequent verbal cues, this has improved by 30%    Status On-going             PT Long  Term Goals - 11/28/19 1020      PT LONG TERM GOAL #1   Title be independent in advanced HEP    Time 12    Period Weeks    Status On-going    Target Date 02/20/20      PT LONG TERM GOAL #2   Title Pt will perform 5x sit to stand in less than or equal to 19  seconds for improved functional lower extremity strength.    Baseline 26.89 seconds    Time 12    Period Weeks    Status On-going    Target Date 02/20/20      PT LONG TERM GOAL #3   Title perform TUG in < or = to 20 seconds to improve safety and balance    Baseline 25.05 seconds    Time 12    Period Weeks    Status On-going    Target Date 02/20/20      PT LONG TERM GOAL #4   Title perform sit to stand without UE support due to improved functional LE strength    Baseline still requires min to mod UE support    Time 12    Period Weeks    Status On-going    Target Date 02/20/20                 Plan - 12/23/19 1001    Clinical Impression Statement Pt had prolonged time in restroom in the middle of  PT session. Pt demonstrates significantly improved safety with sit to stand and reduced her 5x sit to stand time by 11 sec last session.  Pt requires close supervision and frequent verbal cues to stay on task during session.  Pt required verbal cues for correct use of walker for transitions and for placement with walking. Pt walked for 5 minutes today and was fatigued after needing to sit.  Pt will continue to benefit from skilled PT to address mobility to improve safety and independence.    PT Frequency 2x / week    PT Duration 12 weeks    PT Treatment/Interventions ADLs/Self Care Home Management;Cryotherapy;Gait training;Stair training;Functional mobility training;Neuromuscular re-education;Balance training;Therapeutic exercise;Therapeutic activities;Patient/family education;Manual techniques;Passive range of motion    PT Next  Visit Plan work on sit to stand, endurance, strength, gait    PT Home Exercise Plan Access Code:  [email or web address]    Consulted and Agree with Plan of Care Patient           Patient will benefit from skilled therapeutic intervention in order to improve the following deficits and impairments:  Abnormal gait, Decreased activity tolerance, Decreased balance, Decreased endurance, Decreased knowledge of precautions, Decreased mobility, Decreased range of motion, Decreased strength, Impaired flexibility, Pain, Improper body mechanics, Difficulty walking  Visit Diagnosis: Other abnormalities of gait and mobility  Unsteadiness on feet  Muscle weakness (generalized)     Problem List Patient Active Problem List   Diagnosis Date Noted   Torn ear lobe, right, initial encounter 02/14/2018   Gait abnormality 08/15/2017   Memory loss 08/15/2017   Chronic low back pain 07/01/2016   Abnormality of gait 05/12/2016   Paresthesia 05/12/2016   OA (osteoarthritis) of knee 07/07/2014     Sigurd Sos, PT 12/23/19 10:16 AM  [facility] [address], [place], [place], [address] Phone: [phone]   Fax:  [phone]  Name: SONORA CATLIN MRN: [id number] Date of Birth: [birth year]

## 2019-12-25 ENCOUNTER — Ambulatory Visit: Payer: Medicare Other | Admitting: Physical Therapy

## 2019-12-25 ENCOUNTER — Other Ambulatory Visit: Payer: Self-pay

## 2019-12-25 ENCOUNTER — Encounter: Payer: Self-pay | Admitting: Physical Therapy

## 2019-12-25 DIAGNOSIS — R2689 Other abnormalities of gait and mobility: Secondary | ICD-10-CM

## 2019-12-25 DIAGNOSIS — R2681 Unsteadiness on feet: Secondary | ICD-10-CM

## 2019-12-25 DIAGNOSIS — M6281 Muscle weakness (generalized): Secondary | ICD-10-CM

## 2019-12-25 NOTE — Therapy (Signed)
[facility] [address], [place], [place], [address] Phone: [phone]   Fax:  [phone]  Physical Therapy Treatment  Patient Details  Name: [name] MRN: [id number] Date of Birth: 07-04-1945 Referring Provider (PT): Sela Hilding, MD   Encounter Date: 12/25/2019   PT End of Session - 12/25/19 0931    Visit Number 18    Date for PT Re-Evaluation 02/20/20    Authorization Type Medicare BCBS    PT Start Time 0930    PT Stop Time 1012    PT Time Calculation (min) 42 min    Activity Tolerance Patient tolerated treatment well    Behavior During Therapy [facility] for tasks assessed/performed           Past Medical History:  Diagnosis Date  . Anxiety   . Bipolar 1 disorder ([place])    followed by leslie o'neal NP  . DDD (degenerative disc disease), cervical   . Depression   . Diabetic retinopathy ([place])    mild right eye per pt  . History of asthma    childhood  . History of duodenal ulcer 1989   remote  . History of external beam radiation therapy    1995  left breast  . History of left breast cancer    1995  s/p  breast lumpectomy and radiation therapy  . Hyperlipidemia   . Hypertension   . OA (osteoarthritis)    hands  . PMB (postmenopausal bleeding)   . Type 2 diabetes mellitus treated with insulin [facility])    endocrinologist-- dr Chalmers Cater--- fasting sugar 140 (7 day average),  checks 4-5 times daily  . Urge urinary incontinence   . Wears glasses     Past Surgical History:  Procedure Laterality Date  . BREAST LUMPECTOMY  1995   left breast / radiation  . DILATION AND CURETTAGE OF UTERUS  1989  . HYSTEROSCOPY WITH D & C N/A 02/06/2019   Procedure: DILATATION AND CURETTAGE /HYSTEROSCOPY;  Surgeon: [name], MD;  Location: [place];  Service: Gynecology;  Laterality: N/A;  . KNEE ARTHROSCOPY Right 2015  . TONSILLECTOMY  child  . TOTAL KNEE ARTHROPLASTY Right 07/07/2014    Procedure: RIGHT TOTAL KNEE ARTHROPLASTY;  Surgeon: [name], MD;  [facility];  Service: Orthopedics;  Laterality: Right;  . TOTAL KNEE ARTHROPLASTY Left 07/06/2015   Procedure: LEFT TOTAL KNEE ARTHROPLASTY;  Surgeon: Gaynelle Arabian, MD;  Location: [facility];  Service: Orthopedics;  Laterality: Left;    There were no vitals filed for this visit.   Subjective Assessment - 12/25/19 0944    Subjective I am tolieting myself, walking, and standing better.    Currently in Pain? No/denies    Multiple Pain Sites No                             [facility] Adult PT Treatment/Exercise - 12/25/19 0001      Ambulation/Gait   Gait Comments walk around gym with walker x 6 minutes    VC to breathe to help decrease anxiety     Knee/Hip Exercises: Aerobic   Nustep L3 x 8 min PTA present   VC to maintain > 35 SPM throughout     Knee/Hip Exercises: Seated   Long Arc Quad Strengthening;Both;2 sets;15 reps;Weights   TC for full knee extension   Long Arc Quad Weight 3 lbs.    Clamshell with TheraBand --  Blue loop 30x, Vc for eccentrics   Marching Strengthening;Right;Left;Weights;20 reps   2 sets   Marching Limitations 3    Abd/Adduction Limitations seated ball chops 2# ball 2x1 minute                    PT Short Term Goals - 11/19/19 1023      PT SHORT TERM GOAL #1   Title be independent in initial HEP    Status Achieved      PT SHORT TERM GOAL #2   Title Pt will improve 5x sit to stand in less than or equal to 25  seconds to demonstrate improved functional strength.    Baseline 17 with poor technique, 35 with improved technique and max cueing for technique    Time 4    Period Weeks    Status On-going      PT SHORT TERM GOAL #3   Title Pt will improve TUG score to less than or equal to 25 seconds    Baseline 22.88 without walker    Status Achieved      PT SHORT TERM GOAL #4   Title stand with erect posture > or = to 50% of the time without cueing     Baseline this requires frequent verbal cues, this has improved by 30%    Status On-going             PT Long Term Goals - 11/28/19 1020      PT LONG TERM GOAL #1   Title be independent in advanced HEP    Time 12    Period Weeks    Status On-going    Target Date 02/20/20      PT LONG TERM GOAL #2   Title Pt will perform 5x sit to stand in less than or equal to 19  seconds for improved functional lower extremity strength.    Baseline 26.89 seconds    Time 12    Period Weeks    Status On-going    Target Date 02/20/20      PT LONG TERM GOAL #3   Title perform TUG in < or = to 20 seconds to improve safety and balance    Baseline 25.05 seconds    Time 12    Period Weeks    Status On-going    Target Date 02/20/20      PT LONG TERM GOAL #4   Title perform sit to stand without UE support due to improved functional LE strength    Baseline still requires min to mod UE support    Time 12    Period Weeks    Status On-going    Target Date 02/20/20                 Plan - 12/25/19 0932    Clinical Impression Statement Pt  increased her walking time to 6 min without rest and only required minimal verbal reminders regarding her posture. Of course fatigued, but nothing significant. Pt reports her walking, standing and tolieting all have improved.    Personal Factors and Comorbidities Comorbidity 2    Comorbidities chronic balance deficts, bipolar disorder, HTN    Examination-Activity Limitations Locomotion Level;Bathing;Squat;Stairs;Stand;Transfers    Examination-Participation Restrictions Driving;Community Activity;Cleaning;Shop    Stability/Clinical Decision Making Evolving/Moderate complexity    PT Frequency 2x / week    PT Duration 12 weeks    PT Treatment/Interventions ADLs/Self Care Home Management;Cryotherapy;Gait training;Stair training;Functional mobility training;Neuromuscular re-education;Balance training;Therapeutic exercise;Therapeutic activities;Patient/family  education;Manual techniques;Passive range of motion    PT Next Visit Plan work on sit to stand, endurance, strength, gait    PT Home Exercise Plan Access Code: [email or web address]    Consulted and Agree with Plan of Care Patient           Patient will benefit from skilled therapeutic intervention in order to improve the following deficits and impairments:  Abnormal gait, Decreased activity tolerance, Decreased balance, Decreased endurance, Decreased knowledge of precautions, Decreased mobility, Decreased range of motion, Decreased strength, Impaired flexibility, Pain, Improper body mechanics, Difficulty walking  Visit Diagnosis: Other abnormalities of gait and mobility  Unsteadiness on feet  Muscle weakness (generalized)     Problem List Patient Active Problem List   Diagnosis Date Noted  . Torn ear lobe, right, initial encounter 02/14/2018  . Gait abnormality 08/15/2017  . Memory loss 08/15/2017  . Chronic low back pain 07/01/2016  . Abnormality of gait 05/12/2016  . Paresthesia 05/12/2016  . OA (osteoarthritis) of knee 07/07/2014    Shiane Wenberg, PTA 12/25/2019, 10:07 AM  [facility] [address], [place], [place], [address] Phone: [phone]   Fax:  [phone]  Name: [name] MRN: [id number] Date of Birth: [birth year]

## 2019-12-30 ENCOUNTER — Ambulatory Visit: Payer: Medicare Other

## 2019-12-30 ENCOUNTER — Other Ambulatory Visit: Payer: Self-pay

## 2019-12-30 DIAGNOSIS — R2689 Other abnormalities of gait and mobility: Secondary | ICD-10-CM | POA: Diagnosis not present

## 2019-12-30 DIAGNOSIS — R2681 Unsteadiness on feet: Secondary | ICD-10-CM

## 2019-12-30 DIAGNOSIS — M6281 Muscle weakness (generalized): Secondary | ICD-10-CM

## 2019-12-30 NOTE — Therapy (Signed)
[facility] [address], [place], [place], [address] Phone: [phone]   Fax:  [phone]  Physical Therapy Treatment  Patient Details  Name: [name] MRN: [id number] Date of Birth: 13-Jan-1946 Referring Provider (PT): Sela Hilding, MD   Encounter Date: 12/30/2019   PT End of Session - 12/30/19 1016    Visit Number 19    Date for PT Re-Evaluation 02/20/20    Authorization Type Medicare BCBS    PT Start Time 5993    PT Stop Time 1017    PT Time Calculation (min) 42 min    Activity Tolerance Patient tolerated treatment well    Behavior During Therapy [facility] for tasks assessed/performed           Past Medical History:  Diagnosis Date  . Anxiety   . Bipolar 1 disorder ([place])    followed by leslie o'neal NP  . DDD (degenerative disc disease), cervical   . Depression   . Diabetic retinopathy ([place])    mild right eye per pt  . History of asthma    childhood  . History of duodenal ulcer 1989   remote  . History of external beam radiation therapy    1995  left breast  . History of left breast cancer    1995  s/p  breast lumpectomy and radiation therapy  . Hyperlipidemia   . Hypertension   . OA (osteoarthritis)    hands  . PMB (postmenopausal bleeding)   . Type 2 diabetes mellitus treated with insulin [facility])    endocrinologist-- dr Chalmers Cater--- fasting sugar 140 (7 day average),  checks 4-5 times daily  . Urge urinary incontinence   . Wears glasses     Past Surgical History:  Procedure Laterality Date  . BREAST LUMPECTOMY  1995   left breast / radiation  . DILATION AND CURETTAGE OF UTERUS  1989  . HYSTEROSCOPY WITH D & C N/A 02/06/2019   Procedure: DILATATION AND CURETTAGE /HYSTEROSCOPY;  Surgeon: [name], MD;  Location: [place];  Service: Gynecology;  Laterality: N/A;  . KNEE ARTHROSCOPY Right 2015  . TONSILLECTOMY  child  . TOTAL KNEE ARTHROPLASTY Right 07/07/2014    Procedure: RIGHT TOTAL KNEE ARTHROPLASTY;  Surgeon: [name], MD;  [facility];  Service: Orthopedics;  Laterality: Right;  . TOTAL KNEE ARTHROPLASTY Left 07/06/2015   Procedure: LEFT TOTAL KNEE ARTHROPLASTY;  Surgeon: Gaynelle Arabian, MD;  [facility];  Service: Orthopedics;  Laterality: Left;    There were no vitals filed for this visit.   Subjective Assessment - 12/30/19 1018    Subjective I am doing exercise and trying to move more at home    Currently in Pain? No/denies                             [facility] Adult PT Treatment/Exercise - 12/30/19 0001      Ambulation/Gait   Gait Comments walk around gym with walker x 7 minutes-very slow mobilty, 3 laps   VC to stay close to the walker and improve posture     Knee/Hip Exercises: Aerobic   Nustep L3 x 8 min PTpresent   VC to maintain > 35 SPM throughout     Knee/Hip Exercises: Standing   Other Standing Knee Exercises alternating step taps on 2" step (green pod): close contact by PT with hand held assitance for safety      Knee/Hip  Exercises: Seated   Long Arc Quad Strengthening;Both;2 sets;15 reps;Weights   TC for full knee extension   Long Arc Quad Weight 3 lbs.    Clamshell with TheraBand --   Blue loop 30x, Vc for eccentrics   Marching Strengthening;Right;Left;Weights;20 reps   2 sets   Marching Limitations 3    Abd/Adduction Limitations seated ball chops 2# ball 2x1 minute    Sit to [employer] 2 sets;5 reps                    PT Short Term Goals - 12/30/19 0946      PT SHORT TERM GOAL #1   Status Revised      PT SHORT TERM GOAL #2   Title Pt will improve 5x sit to stand in less than or equal to 25  seconds to demonstrate improved functional strength.    Baseline not able to test- pt very anxious with testing    Time 4    Period Weeks    Status On-going      PT SHORT TERM GOAL #4   Title stand with erect posture > or = to 50% of the time without cueing    Status Achieved               PT Long Term Goals - 12/30/19 0947      PT LONG TERM GOAL #1   Title be independent in advanced HEP    Time 12    Period Weeks    Status On-going                 Plan - 12/30/19 0935    Clinical Impression Statement Pt continues to be challenged with staying on task with exercises in the clinic.  A timer is used instead of counting reps. Pt with improved technique with sit to stand and overall mobility.PT not able to test 5x sit to stand due to anxiety with this task.  Pt  increased her walking time to 7 min without rest and demonstrated very slow mobility today. Pt reports her walking, standing and toileting all have improved.  Pt will continue to benefit from skilled PT to address strength, balance and endurance to improve safety at home and in the community.    Rehab Potential Good    PT Frequency 2x / week    PT Duration 12 weeks    PT Treatment/Interventions ADLs/Self Care Home Management;Cryotherapy;Gait training;Stair training;Functional mobility training;Neuromuscular re-education;Balance training;Therapeutic exercise;Therapeutic activities;Patient/family education;Manual techniques;Passive range of motion    PT Next Visit Plan work on sit to stand, endurance, strength, gait    PT Home Exercise Plan Access Code: [email or web address]    Consulted and Agree with Plan of Care Patient           Patient will benefit from skilled therapeutic intervention in order to improve the following deficits and impairments:  Abnormal gait, Decreased activity tolerance, Decreased balance, Decreased endurance, Decreased knowledge of precautions, Decreased mobility, Decreased range of motion, Decreased strength, Impaired flexibility, Pain, Improper body mechanics, Difficulty walking  Visit Diagnosis: Other abnormalities of gait and mobility  Muscle weakness (generalized)  Unsteadiness on feet     Problem List Patient Active Problem List   Diagnosis Date Noted  . Torn ear lobe, right,  initial encounter 02/14/2018  . Gait abnormality 08/15/2017  . Memory loss 08/15/2017  . Chronic low back pain 07/01/2016  . Abnormality of gait 05/12/2016  . Paresthesia 05/12/2016  . OA (osteoarthritis) of knee 07/07/2014  Sigurd Sos, PT 12/30/19 10:19 AM  [facility] [address] [place], [place], [address] Phone: [phone]   Fax:  [phone]  Name: [name] MRN: [id number] Date of Birth: [birth year]

## 2020-01-01 ENCOUNTER — Ambulatory Visit: Payer: Medicare Other

## 2020-01-01 ENCOUNTER — Other Ambulatory Visit: Payer: Self-pay

## 2020-01-01 DIAGNOSIS — R2689 Other abnormalities of gait and mobility: Secondary | ICD-10-CM

## 2020-01-01 DIAGNOSIS — R2681 Unsteadiness on feet: Secondary | ICD-10-CM

## 2020-01-01 DIAGNOSIS — M6281 Muscle weakness (generalized): Secondary | ICD-10-CM

## 2020-01-01 NOTE — Therapy (Signed)
[facility] [address] [place], [place], [address] Phone: [phone]   Fax:  [phone]  Physical Therapy Treatment  Patient Details  Name: [name] MRN: [id number] Date of Birth: 03-23-1946 Referring Provider (PT): Sela Hilding, MD  Progress Note Reporting Period 11/21/19 to 01/01/2020  See note below for Objective Data and Assessment of Progress/Goals.      Encounter Date: 01/01/2020   PT End of Session - 01/01/20 1011    Visit Number 20    Date for PT Re-Evaluation 02/20/20    Authorization Type Medicare BCBS    PT Start Time 0932    PT Stop Time 1012    PT Time Calculation (min) 40 min    Activity Tolerance Patient tolerated treatment well    Behavior During Therapy [facility] for tasks assessed/performed           Past Medical History:  Diagnosis Date  . Anxiety   . Bipolar 1 disorder ([place])    followed by leslie o'neal NP  . DDD (degenerative disc disease), cervical   . Depression   . Diabetic retinopathy ([place])    mild right eye per pt  . History of asthma    childhood  . History of duodenal ulcer 1989   remote  . History of external beam radiation therapy    1995  left breast  . History of left breast cancer    1995  s/p  breast lumpectomy and radiation therapy  . Hyperlipidemia   . Hypertension   . OA (osteoarthritis)    hands  . PMB (postmenopausal bleeding)   . Type 2 diabetes mellitus treated with insulin [facility])    endocrinologist-- dr Chalmers Cater--- fasting sugar 140 (7 day average),  checks 4-5 times daily  . Urge urinary incontinence   . Wears glasses     Past Surgical History:  Procedure Laterality Date  . BREAST LUMPECTOMY  1995   left breast / radiation  . DILATION AND CURETTAGE OF UTERUS  1989  . HYSTEROSCOPY WITH D & C N/A 02/06/2019   Procedure: DILATATION AND CURETTAGE /HYSTEROSCOPY;  Surgeon: [name], MD;  Location: [place];  Service:  Gynecology;  Laterality: N/A;  . KNEE ARTHROSCOPY Right 2015  . TONSILLECTOMY  child  . TOTAL KNEE ARTHROPLASTY Right 07/07/2014   Procedure: RIGHT TOTAL KNEE ARTHROPLASTY;  Surgeon: [name], MD;  [facility];  Service: Orthopedics;  Laterality: Right;  . TOTAL KNEE ARTHROPLASTY Left 07/06/2015   Procedure: LEFT TOTAL KNEE ARTHROPLASTY;  Surgeon: Gaynelle Arabian, MD;  [facility];  Service: Orthopedics;  Laterality: Left;    There were no vitals filed for this visit.   Subjective Assessment - 01/01/20 0937    Subjective I've not been doing my exercises at home as much due to being busy.              [facility] PT Assessment - 01/01/20 0001      Assessment   Medical Diagnosis frequent falls    Referring Provider (PT) Sela Hilding, MD      [place] residence      Cognition   Overall Cognitive Status Within Functional Limits for tasks assessed      Ambulation/Gait   Gait Comments walk around gym with walker x 7 minutes-very slow mobilty, 3 laps   VC to stay close to the walker and improve posture  [place] Adult PT Treatment/Exercise - 01/01/20 0001      Knee/Hip Exercises: Aerobic   Nustep L3 x 8 min PTpresent   VC to maintain > 35 SPM throughout     Knee/Hip Exercises: Seated   Long Arc Quad Strengthening;Both;2 sets;15 reps;Weights   TC for full knee extension   Long Arc Quad Weight 3 lbs.    Clamshell with TheraBand --   Blue loop 30x, Vc for eccentrics   Marching Strengthening;Right;Left;Weights;20 reps   2 sets   Marching Limitations 3    Abd/Adduction Limitations seated ball chops 2# ball 2x1 minute    Sit to [place] 2 sets;5 reps   difficulty with erect posture today due to posterior weight                    PT Short Term Goals - 12/30/19 0946      PT SHORT TERM GOAL #1   Status Revised      PT SHORT TERM GOAL #2   Title Pt will improve 5x sit to stand in less  than or equal to 25  seconds to demonstrate improved functional strength.    Baseline not able to test- pt very anxious with testing    Time 4    Period Weeks    Status On-going      PT SHORT TERM GOAL #4   Title stand with erect posture > or = to 50% of the time without cueing    Status Achieved             PT Long Term Goals - 01/01/20 0943      PT LONG TERM GOAL #1   Title be independent in advanced HEP    Time 12    Period Weeks    Status On-going      PT LONG TERM GOAL #2   Title Pt will perform 5x sit to stand in less than or equal to 19  seconds for improved functional lower extremity strength.    Baseline 26.89 seconds    Time 12    Period Weeks    Status On-going      PT LONG TERM GOAL #3   Title perform TUG in < or = to 20 seconds to improve safety and balance    Baseline 25.05 seconds    Time 12    Period Weeks    Status On-going      PT LONG TERM GOAL #4   Title perform sit to stand without UE support due to improved functional LE strength    Baseline still requires min to mod UE support    Time 12    Period Weeks    Status On-going                 Plan - 01/01/20 0957    Clinical Impression Statement Pt continues to be challenged with staying on task with exercises in the clinic.  A timer is used instead of counting reps. Pt with improved technique with sit to stand and overall mobility. Pt demonstrates improved technique with stand to sit with reduced UE support required.  Today, pt was significantly challenged with standing erect and posterior weight shift caused uncontrolled descent with stand to sit.  Anxiety was high to day. PT not able to test 5x sit to stand or TUG due to anxiety with these tasks.  Pt increased her walking time to 7 min without rest and demonstrated very slow mobility today. Pt  reports her walking, standing and toileting all have improved.  Pt will continue to benefit from skilled PT to address strength, balance and endurance  to improve safety at home and in the community.    PT Frequency 2x / week    PT Duration 12 weeks    PT Treatment/Interventions ADLs/Self Care Home Management;Cryotherapy;Gait training;Stair training;Functional mobility training;Neuromuscular re-education;Balance training;Therapeutic exercise;Therapeutic activities;Patient/family education;Manual techniques;Passive range of motion    PT Next Visit Plan work on sit to stand, endurance, strength, gait    PT Home Exercise Plan Access Code: [email or web address]    Consulted and Agree with Plan of Care Patient           Patient will benefit from skilled therapeutic intervention in order to improve the following deficits and impairments:  Abnormal gait, Decreased activity tolerance, Decreased balance, Decreased endurance, Decreased knowledge of precautions, Decreased mobility, Decreased range of motion, Decreased strength, Impaired flexibility, Pain, Improper body mechanics, Difficulty walking  Visit Diagnosis: Other abnormalities of gait and mobility  Muscle weakness (generalized)  Unsteadiness on feet     Problem List Patient Active Problem List   Diagnosis Date Noted  . Torn ear lobe, right, initial encounter 02/14/2018  . Gait abnormality 08/15/2017  . Memory loss 08/15/2017  . Chronic low back pain 07/01/2016  . Abnormality of gait 05/12/2016  . Paresthesia 05/12/2016  . OA (osteoarthritis) of knee 07/07/2014    Sigurd Sos, PT 01/01/20 10:13 AM   [facility] [address], [place], [place], [address] Phone: [phone]   Fax:  [phone]  Name: [name] MRN: [id number] Date of Birth: [birth year]

## 2020-01-06 ENCOUNTER — Ambulatory Visit: Payer: Medicare Other

## 2020-01-06 ENCOUNTER — Other Ambulatory Visit: Payer: Self-pay

## 2020-01-06 DIAGNOSIS — M6281 Muscle weakness (generalized): Secondary | ICD-10-CM

## 2020-01-06 DIAGNOSIS — R2689 Other abnormalities of gait and mobility: Secondary | ICD-10-CM | POA: Diagnosis not present

## 2020-01-06 DIAGNOSIS — R2681 Unsteadiness on feet: Secondary | ICD-10-CM

## 2020-01-06 NOTE — Therapy (Signed)
[facility] [address], [place], [place], [address] Phone: [phone]   Fax:  [phone]  Physical Therapy Treatment  Patient Details  Name: [name] MRN: [id number] Date of Birth: [birth year] Referring Provider (PT): Sela Hilding, MD   Encounter Date: 01/06/2020   PT End of Session - 01/06/20 1010    Visit Number 21    Date for PT Re-Evaluation 02/20/20    Authorization Type Medicare BCBS    PT Start Time 0939    PT Stop Time 1012    PT Time Calculation (min) 33 min    Activity Tolerance Patient tolerated treatment well    Behavior During Therapy [facility] for tasks assessed/performed           Past Medical History:  Diagnosis Date   Anxiety    Bipolar 1 disorder ([place])    followed by Debbora Dus NP   DDD (degenerative disc disease), cervical    Depression    Diabetic retinopathy ([place])    mild right eye per pt   History of asthma    childhood   History of duodenal ulcer 1989   remote   History of external beam radiation therapy    1995  left breast   History of left breast cancer    1995  s/p  breast lumpectomy and radiation therapy   Hyperlipidemia    Hypertension    OA (osteoarthritis)    hands   PMB (postmenopausal bleeding)    Type 2 diabetes mellitus treated with insulin [facility])    endocrinologist-- dr Chalmers Cater--- fasting sugar 140 (7 day average),  checks 4-5 times daily   Urge urinary incontinence    Wears glasses     Past Surgical History:  Procedure Laterality Date   BREAST LUMPECTOMY  1995   left breast / radiation   DILATION AND CURETTAGE OF UTERUS  1989   HYSTEROSCOPY WITH D & C N/A 02/06/2019   Procedure: DILATATION AND CURETTAGE /HYSTEROSCOPY;  Surgeon: [name], MD;  Location: [place];  Service: Gynecology;  Laterality: N/A;   KNEE ARTHROSCOPY Right 2015   TONSILLECTOMY  child   TOTAL KNEE ARTHROPLASTY Right 07/07/2014    Procedure: RIGHT TOTAL KNEE ARTHROPLASTY;  Surgeon: [name], MD;  [facility];  Service: Orthopedics;  Laterality: Right;   TOTAL KNEE ARTHROPLASTY Left 07/06/2015   Procedure: LEFT TOTAL KNEE ARTHROPLASTY;  Surgeon: Gaynelle Arabian, MD;  Location: [facility];  Service: Orthopedics;  Laterality: Left;    There were no vitals filed for this visit.   Subjective Assessment - 01/06/20 0930    Subjective 8 minutes late due to using bathroom before session started.    Currently in Pain? No/denies                             [facility] Adult PT Treatment/Exercise - 01/06/20 0001      Ambulation/Gait   Gait Comments walk around gym with walker x 7 minutes-very slow mobilty, 3 laps   VC to stay close to the walker and improve posture     Knee/Hip Exercises: Aerobic   Nustep --   NuStep not available     Knee/Hip Exercises: Seated   Long Arc Quad Strengthening;Both;2 sets;15 reps;Weights   TC for full knee extension   Long Arc Quad Weight 3 lbs.    Long [employer] Limitations verbal cues for [employer] with  TheraBand --   Blue loop 30x, VC for control   Marching Strengthening;Right;Left;Weights;20 reps   2 sets   Marching Limitations 3    Abd/Adduction Limitations seated ball chops 2# ball 2x1 minute    Sit to Sand 2 sets;10 reps;with UE support   used walker in front of pt to reduce extension of trunk                   PT Short Term Goals - 12/30/19 0946      PT SHORT TERM GOAL #1   Status Revised      PT SHORT TERM GOAL #2   Title Pt will improve 5x sit to stand in less than or equal to 25  seconds to demonstrate improved functional strength.    Baseline not able to test- pt very anxious with testing    Time 4    Period Weeks    Status On-going      PT SHORT TERM GOAL #4   Title stand with erect posture > or = to 50% of the time without cueing    Status Achieved             PT Long Term Goals - 01/01/20 0943      PT LONG TERM  GOAL #1   Title be independent in advanced HEP    Time 12    Period Weeks    Status On-going      PT LONG TERM GOAL #2   Title Pt will perform 5x sit to stand in less than or equal to 19  seconds for improved functional lower extremity strength.    Baseline 26.89 seconds    Time 12    Period Weeks    Status On-going      PT LONG TERM GOAL #3   Title perform TUG in < or = to 20 seconds to improve safety and balance    Baseline 25.05 seconds    Time 12    Period Weeks    Status On-going      PT LONG TERM GOAL #4   Title perform sit to stand without UE support due to improved functional LE strength    Baseline still requires min to mod UE support    Time 12    Period Weeks    Status On-going                 Plan - 01/06/20 1004    Clinical Impression Statement Pt continues to be challenged with staying on task with exercises in the clinic. Max verbal cues are required throughout session.  A timer is used instead of counting reps. Pt has demonstrated improved overall mobility although this is variable based on level of anxiety. Pt demonstrates improved technique with stand to sit with reduced UE support required.  Pt continues to perform trunk extension upon standing.  Pt walked for 7 min without rest and demonstrated improved speed and covered more distance. Pt reports her walking, standing and toileting all have improved.  Pt will continue to benefit from skilled PT to address strength, balance, and endurance to improve safety at home and in the community.    PT Frequency 2x / week    PT Duration 12 weeks    PT Treatment/Interventions ADLs/Self Care Home Management;Cryotherapy;Gait training;Stair training;Functional mobility training;Neuromuscular re-education;Balance training;Therapeutic exercise;Therapeutic activities;Patient/family education;Manual techniques;Passive range of motion    PT Next Visit Plan work on sit to stand, endurance, strength, gait  PT Home Exercise  Plan Access Code: [email or web address]    Consulted and Agree with Plan of Care Patient           Patient will benefit from skilled therapeutic intervention in order to improve the following deficits and impairments:  Abnormal gait, Decreased activity tolerance, Decreased balance, Decreased endurance, Decreased knowledge of precautions, Decreased mobility, Decreased range of motion, Decreased strength, Impaired flexibility, Pain, Improper body mechanics, Difficulty walking  Visit Diagnosis: Other abnormalities of gait and mobility  Muscle weakness (generalized)  Unsteadiness on feet     Problem List Patient Active Problem List   Diagnosis Date Noted   Torn ear lobe, right, initial encounter 02/14/2018   Gait abnormality 08/15/2017   Memory loss 08/15/2017   Chronic low back pain 07/01/2016   Abnormality of gait 05/12/2016   Paresthesia 05/12/2016   OA (osteoarthritis) of knee 07/07/2014    Sigurd Sos, PT 01/06/20 10:12 AM  [facility] [address], [place], [place], [address] Phone: [phone]   Fax:  [phone]  Name: [name] MRN: [id number] Date of Birth: 1946-03-20

## 2020-01-08 ENCOUNTER — Ambulatory Visit: Payer: Medicare Other | Attending: Family Medicine | Admitting: Physical Therapy

## 2020-01-08 ENCOUNTER — Other Ambulatory Visit: Payer: Self-pay

## 2020-01-08 ENCOUNTER — Encounter: Payer: Self-pay | Admitting: Physical Therapy

## 2020-01-08 DIAGNOSIS — M6281 Muscle weakness (generalized): Secondary | ICD-10-CM | POA: Insufficient documentation

## 2020-01-08 DIAGNOSIS — R2681 Unsteadiness on feet: Secondary | ICD-10-CM | POA: Diagnosis present

## 2020-01-08 DIAGNOSIS — R2689 Other abnormalities of gait and mobility: Secondary | ICD-10-CM | POA: Insufficient documentation

## 2020-01-08 NOTE — Therapy (Signed)
[facility] [address] [place], [place], [address] Phone: [phone]   Fax:  [phone]  Physical Therapy Treatment  Patient Details  Name: [name] MRN: [id number] Date of Birth: 1945/07/20 Referring Provider (PT): Sela Hilding, MD   Encounter Date: 01/08/2020   PT End of Session - 01/08/20 1446    Visit Number 22    Date for PT Re-Evaluation 02/20/20    Authorization Type Medicare BCBS    PT Start Time 6720    PT Stop Time 1523    PT Time Calculation (min) 38 min    Activity Tolerance Patient tolerated treatment well    Behavior During Therapy [facility] for tasks assessed/performed           Past Medical History:  Diagnosis Date  . Anxiety   . Bipolar 1 disorder ([place])    followed by leslie o'neal NP  . DDD (degenerative disc disease), cervical   . Depression   . Diabetic retinopathy ([place])    mild right eye per pt  . History of asthma    childhood  . History of duodenal ulcer 1989   remote  . History of external beam radiation therapy    1995  left breast  . History of left breast cancer    1995  s/p  breast lumpectomy and radiation therapy  . Hyperlipidemia   . Hypertension   . OA (osteoarthritis)    hands  . PMB (postmenopausal bleeding)   . Type 2 diabetes mellitus treated with insulin [facility])    endocrinologist-- dr Chalmers Cater--- fasting sugar 140 (7 day average),  checks 4-5 times daily  . Urge urinary incontinence   . Wears glasses     Past Surgical History:  Procedure Laterality Date  . BREAST LUMPECTOMY  1995   left breast / radiation  . DILATION AND CURETTAGE OF UTERUS  1989  . HYSTEROSCOPY WITH D & C N/A 02/06/2019   Procedure: DILATATION AND CURETTAGE /HYSTEROSCOPY;  Surgeon: [name], MD;  Location: [place];  Service: Gynecology;  Laterality: N/A;  . KNEE ARTHROSCOPY Right 2015  . TONSILLECTOMY  child  . TOTAL KNEE ARTHROPLASTY Right 07/07/2014    Procedure: RIGHT TOTAL KNEE ARTHROPLASTY;  Surgeon: [name], MD;  [facility];  Service: Orthopedics;  Laterality: Right;  . TOTAL KNEE ARTHROPLASTY Left 07/06/2015   Procedure: LEFT TOTAL KNEE ARTHROPLASTY;  Surgeon: Gaynelle Arabian, MD;  [facility];  Service: Orthopedics;  Laterality: Left;    There were no vitals filed for this visit.   Subjective Assessment - 01/08/20 1447    Subjective No new complaints    Pertinent History balance deficits    Currently in Pain? No/denies                             [facility] Adult PT Treatment/Exercise - 01/08/20 0001      Ambulation/Gait   Gait Comments walk around gym with walker x 8 minutes-very slow mobilty, 4 laps   VC to stay close to the walker and improve posture     Knee/Hip Exercises: Aerobic   Nustep L3 x10 min with PTA present      Knee/Hip Exercises: Seated   Long Arc Quad Strengthening;Both;2 sets;20 reps;Weights    Long Arc Quad Weight 3 lbs.    Long [employer] Limitations verbal cues for technique    Clamshell with TheraBand --   [place]  loop 30x, VC for control   Marching Strengthening;Right;Left;Weights;20 reps   2 sets   Marching Limitations 3   VC to stay on task   Abd/Adduction Limitations seated ball chops 2# ball 2x1 minute    Sit to Sand 2 sets;10 reps;with UE support   used walker in front of pt to reduce extension of trunk                   PT Short Term Goals - 12/30/19 0946      PT SHORT TERM GOAL #1   Status Revised      PT SHORT TERM GOAL #2   Title Pt will improve 5x sit to stand in less than or equal to 25  seconds to demonstrate improved functional strength.    Baseline not able to test- pt very anxious with testing    Time 4    Period Weeks    Status On-going      PT SHORT TERM GOAL #4   Title stand with erect posture > or = to 50% of the time without cueing    Status Achieved             PT Long Term Goals - 01/01/20 0943      PT LONG TERM GOAL #1    Title be independent in advanced HEP    Time 12    Period Weeks    Status On-going      PT LONG TERM GOAL #2   Title Pt will perform 5x sit to stand in less than or equal to 19  seconds for improved functional lower extremity strength.    Baseline 26.89 seconds    Time 12    Period Weeks    Status On-going      PT LONG TERM GOAL #3   Title perform TUG in < or = to 20 seconds to improve safety and balance    Baseline 25.05 seconds    Time 12    Period Weeks    Status On-going      PT LONG TERM GOAL #4   Title perform sit to stand without UE support due to improved functional LE strength    Baseline still requires min to mod UE support    Time 12    Period Weeks    Status On-going                 Plan - 01/08/20 1446    Clinical Impression Statement max verbal  cues continue to keep pt on task. Pt was leaning RT on the Nustep, and could self correct with verbal cue. Pt was bale to walk 8 min with walker today requiring postural cues.    Personal Factors and Comorbidities Comorbidity 2    Comorbidities chronic balance deficts, bipolar disorder, HTN    Examination-Activity Limitations Locomotion Level;Bathing;Squat;Stairs;Stand;Transfers    Examination-Participation Restrictions Driving;Community Activity;Cleaning;Shop    Stability/Clinical Decision Making Evolving/Moderate complexity    Rehab Potential Good    PT Frequency 2x / week    PT Duration 12 weeks    PT Treatment/Interventions ADLs/Self Care Home Management;Cryotherapy;Gait training;Stair training;Functional mobility training;Neuromuscular re-education;Balance training;Therapeutic exercise;Therapeutic activities;Patient/family education;Manual techniques;Passive range of motion    PT Next Visit Plan work on sit to stand, endurance, strength, gait    PT Home Exercise Plan Access Code: [email or web address]    Consulted and Agree with Plan of Care --           Patient will benefit from  skilled therapeutic intervention in  order to improve the following deficits and impairments:  Abnormal gait, Decreased activity tolerance, Decreased balance, Decreased endurance, Decreased knowledge of precautions, Decreased mobility, Decreased range of motion, Decreased strength, Impaired flexibility, Pain, Improper body mechanics, Difficulty walking  Visit Diagnosis: Other abnormalities of gait and mobility  Muscle weakness (generalized)  Unsteadiness on feet     Problem List Patient Active Problem List   Diagnosis Date Noted  . Torn ear lobe, right, initial encounter 02/14/2018  . Gait abnormality 08/15/2017  . Memory loss 08/15/2017  . Chronic low back pain 07/01/2016  . Abnormality of gait 05/12/2016  . Paresthesia 05/12/2016  . OA (osteoarthritis) of knee 07/07/2014    Donovyn Guidice, PTA 01/08/2020, 3:21 PM  [facility] [address] [place], [place], [address] Phone: [phone]   Fax:  [phone]  Name: IOLANDA FOLSON MRN: [id number] Date of Birth: October 20, 1945

## 2020-01-15 ENCOUNTER — Other Ambulatory Visit: Payer: Self-pay

## 2020-01-15 ENCOUNTER — Encounter: Payer: Self-pay | Admitting: Physical Therapy

## 2020-01-15 ENCOUNTER — Ambulatory Visit: Payer: Medicare Other | Admitting: Physical Therapy

## 2020-01-15 DIAGNOSIS — M6281 Muscle weakness (generalized): Secondary | ICD-10-CM

## 2020-01-15 DIAGNOSIS — R2689 Other abnormalities of gait and mobility: Secondary | ICD-10-CM

## 2020-01-15 DIAGNOSIS — R2681 Unsteadiness on feet: Secondary | ICD-10-CM

## 2020-01-15 NOTE — Therapy (Signed)
[facility] [address] [place], [place], [address] Phone: [phone]   Fax:  [phone]  Physical Therapy Treatment  Patient Details  Name: KARL KNARR MRN: [id number] Date of Birth: 1945-07-27 Referring Provider (PT): Sela Hilding, MD   Encounter Date: 01/15/2020   PT End of Session - 01/15/20 0926    Visit Number 23    Date for PT Re-Evaluation 02/20/20    Authorization Type Medicare BCBS    PT Start Time 0926    PT Stop Time 1005    PT Time Calculation (min) 39 min    Activity Tolerance Patient tolerated treatment well    Behavior During Therapy [facility] for tasks assessed/performed           Past Medical History:  Diagnosis Date  . Anxiety   . Bipolar 1 disorder ([place])    followed by leslie o'neal NP  . DDD (degenerative disc disease), cervical   . Depression   . Diabetic retinopathy ([place])    mild right eye per pt  . History of asthma    childhood  . History of duodenal ulcer 1989   remote  . History of external beam radiation therapy    1995  left breast  . History of left breast cancer    1995  s/p  breast lumpectomy and radiation therapy  . Hyperlipidemia   . Hypertension   . OA (osteoarthritis)    hands  . PMB (postmenopausal bleeding)   . Type 2 diabetes mellitus treated with insulin [facility])    endocrinologist-- dr Chalmers Cater--- fasting sugar 140 (7 day average),  checks 4-5 times daily  . Urge urinary incontinence   . Wears glasses     Past Surgical History:  Procedure Laterality Date  . BREAST LUMPECTOMY  1995   left breast / radiation  . DILATION AND CURETTAGE OF UTERUS  1989  . HYSTEROSCOPY WITH D & C N/A 02/06/2019   Procedure: DILATATION AND CURETTAGE /HYSTEROSCOPY;  Surgeon: [name], MD;  Location: [place];  Service: Gynecology;  Laterality: N/A;  . KNEE ARTHROSCOPY Right 2015  . TONSILLECTOMY  child  . TOTAL KNEE ARTHROPLASTY Right 07/07/2014    Procedure: RIGHT TOTAL KNEE ARTHROPLASTY;  Surgeon: [name], MD;  [facility];  Service: Orthopedics;  Laterality: Right;  . TOTAL KNEE ARTHROPLASTY Left 07/06/2015   Procedure: LEFT TOTAL KNEE ARTHROPLASTY;  Surgeon: Gaynelle Arabian, MD;  [facility];  Service: Orthopedics;  Laterality: Left;    There were no vitals filed for this visit.   Subjective Assessment - 01/15/20 0927    Subjective I am doing well. Going up and down the stairs in my house.    Pertinent History balance deficits    Currently in Pain? No/denies    Multiple Pain Sites No                             [facility] Adult PT Treatment/Exercise - 01/15/20 0001      Neuro Re-ed    Neuro Re-ed Details  Light UE step forward & back, side stepping with 3 finger touch at TM, alternating tapping at TM with 3 finger touch at TM.      Knee/Hip Exercises: Aerobic   Nustep L3 x10 min with PTA present   VC/TC to correct RT trunk lean     Knee/Hip Exercises: Standing   Heel Raises Both;1 set;10 reps  Heel Raises Limitations Bil UE holding on.       Knee/Hip Exercises: Seated   Clamshell with TheraBand --   yellow loop 2x10, VC to make "big"   Hamstring Curl --   green band 2x10 Bil   Sit to Sand --   5# kettle bell 5x, VC to stand up erect                   PT Short Term Goals - 12/30/19 0946      PT SHORT TERM GOAL #1   Status Revised      PT SHORT TERM GOAL #2   Title Pt will improve 5x sit to stand in less than or equal to 25  seconds to demonstrate improved functional strength.    Baseline not able to test- pt very anxious with testing    Time 4    Period Weeks    Status On-going      PT SHORT TERM GOAL #4   Title stand with erect posture > or = to 50% of the time without cueing    Status Achieved             PT Long Term Goals - 01/01/20 0943      PT LONG TERM GOAL #1   Title be independent in advanced HEP    Time 12    Period Weeks    Status On-going       PT LONG TERM GOAL #2   Title Pt will perform 5x sit to stand in less than or equal to 19  seconds for improved functional lower extremity strength.    Baseline 26.89 seconds    Time 12    Period Weeks    Status On-going      PT LONG TERM GOAL #3   Title perform TUG in < or = to 20 seconds to improve safety and balance    Baseline 25.05 seconds    Time 12    Period Weeks    Status On-going      PT LONG TERM GOAL #4   Title perform sit to stand without UE support due to improved functional LE strength    Baseline still requires min to mod UE support    Time 12    Period Weeks    Status On-going                 Plan - 01/15/20 0926    Clinical Impression Statement Pt reports she is going p and down her stairs at home "well." Pt requires some level of UE support on the TM arm due to low confidence and anxiety. We worked on a slower progression of full grip of UE during standing balance exercises to 3 fingers with a lighter touch. PTA gave pt tactile cuing to keep from leaning RT.    Personal Factors and Comorbidities Comorbidity 2    Comorbidities chronic balance deficts, bipolar disorder, HTN    Examination-Activity Limitations Locomotion Level;Bathing;Squat;Stairs;Stand;Transfers    Examination-Participation Restrictions Driving;Community Activity;Cleaning;Shop    Stability/Clinical Decision Making Evolving/Moderate complexity    Rehab Potential Good    PT Frequency 2x / week    PT Duration 12 weeks    PT Treatment/Interventions ADLs/Self Care Home Management;Cryotherapy;Gait training;Stair training;Functional mobility training;Neuromuscular re-education;Balance training;Therapeutic exercise;Therapeutic activities;Patient/family education;Manual techniques;Passive range of motion    PT Next Visit Plan work on sit to stand, endurance, strength, gait    PT Home Exercise Plan Access Code: [email or web address]  Consulted and Agree with Plan of Care Patient           Patient will  benefit from skilled therapeutic intervention in order to improve the following deficits and impairments:  Abnormal gait, Decreased activity tolerance, Decreased balance, Decreased endurance, Decreased knowledge of precautions, Decreased mobility, Decreased range of motion, Decreased strength, Impaired flexibility, Pain, Improper body mechanics, Difficulty walking  Visit Diagnosis: Other abnormalities of gait and mobility  Muscle weakness (generalized)  Unsteadiness on feet     Problem List Patient Active Problem List   Diagnosis Date Noted  . Torn ear lobe, right, initial encounter 02/14/2018  . Gait abnormality 08/15/2017  . Memory loss 08/15/2017  . Chronic low back pain 07/01/2016  . Abnormality of gait 05/12/2016  . Paresthesia 05/12/2016  . OA (osteoarthritis) of knee 07/07/2014    Keeanna Villafranca, PTA 01/15/2020, 10:05 AM  [facility] [address] [place], [place], [address] Phone: [phone]   Fax:  [phone]  Name: CHARLSEY MORAGNE MRN: [id number] Date of Birth: 1946/03/04

## 2020-01-20 ENCOUNTER — Other Ambulatory Visit: Payer: Self-pay

## 2020-01-20 ENCOUNTER — Ambulatory Visit: Payer: Medicare Other

## 2020-01-20 DIAGNOSIS — R2689 Other abnormalities of gait and mobility: Secondary | ICD-10-CM | POA: Diagnosis not present

## 2020-01-20 DIAGNOSIS — M6281 Muscle weakness (generalized): Secondary | ICD-10-CM

## 2020-01-20 DIAGNOSIS — R2681 Unsteadiness on feet: Secondary | ICD-10-CM

## 2020-01-20 NOTE — Therapy (Signed)
[facility] [address], [place], [place], [address] Phone: [phone]   Fax:  [phone]  Physical Therapy Treatment  Patient Details  Name: [name] MRN: [id number] Date of Birth: 17-Dec-1945 Referring Provider (PT): Sela Hilding, MD   Encounter Date: 01/20/2020   PT End of Session - 01/20/20 1133    Visit Number 24    Date for PT Re-Evaluation 02/20/20    Authorization Type Medicare BCBS    PT Start Time 1101    PT Stop Time 1142    PT Time Calculation (min) 41 min    Activity Tolerance Patient tolerated treatment well    Behavior During Therapy [facility] for tasks assessed/performed           Past Medical History:  Diagnosis Date  . Anxiety   . Bipolar 1 disorder ([place])    followed by leslie o'neal NP  . DDD (degenerative disc disease), cervical   . Depression   . Diabetic retinopathy ([place])    mild right eye per pt  . History of asthma    childhood  . History of duodenal ulcer 1989   remote  . History of external beam radiation therapy    1995  left breast  . History of left breast cancer    1995  s/p  breast lumpectomy and radiation therapy  . Hyperlipidemia   . Hypertension   . OA (osteoarthritis)    hands  . PMB (postmenopausal bleeding)   . Type 2 diabetes mellitus treated with insulin [facility])    endocrinologist-- dr Chalmers Cater--- fasting sugar 140 (7 day average),  checks 4-5 times daily  . Urge urinary incontinence   . Wears glasses     Past Surgical History:  Procedure Laterality Date  . BREAST LUMPECTOMY  1995   left breast / radiation  . DILATION AND CURETTAGE OF UTERUS  1989  . HYSTEROSCOPY WITH D & C N/A 02/06/2019   Procedure: DILATATION AND CURETTAGE /HYSTEROSCOPY;  Surgeon: [name], MD;  Location: [facility];  Service: Gynecology;  Laterality: N/A;  . KNEE ARTHROSCOPY Right 2015  . TONSILLECTOMY  child  . TOTAL KNEE ARTHROPLASTY Right 07/07/2014    Procedure: RIGHT TOTAL KNEE ARTHROPLASTY;  Surgeon: [name], MD;  [facility];  Service: Orthopedics;  Laterality: Right;  . TOTAL KNEE ARTHROPLASTY Left 07/06/2015   Procedure: LEFT TOTAL KNEE ARTHROPLASTY;  Surgeon: Gaynelle Arabian, MD;  Location: [facility];  Service: Orthopedics;  Laterality: Left;    There were no vitals filed for this visit.   Subjective Assessment - 01/20/20 1137    Subjective I can get up at night in the dark and feel steady when I do this    Currently in Pain? No/denies                             [facility] Adult PT Treatment/Exercise - 01/20/20 0001      Ambulation/Gait   Ambulation/Gait Yes    Gait Comments walking around gym with walker x 8 minutes- verbal cues for erect posture and to remain close to walker.        Neuro Re-ed    Neuro Re-ed Details  Light UE step forward & back, side stepping with 3 finger touch at TM, alternating tapping at TM with 3 finger touch at TM.      Knee/Hip Exercises: Aerobic   Nustep L3 x10 min with PT  present   VC/TC to correct RT trunk lean     Knee/Hip Exercises: Standing   Heel Raises Both;1 set;10 reps    Heel Raises Limitations Bil UE holding on.       Knee/Hip Exercises: Seated   Clamshell with TheraBand --   yellow loop 2x10, VC to make "big"   Hamstring Curl --   green band 2x10 Bil                   PT Short Term Goals - 01/20/20 1104      PT SHORT TERM GOAL #1   Title be independent in initial HEP    Status Achieved      PT SHORT TERM GOAL #4   Title stand with erect posture > or = to 50% of the time without cueing    Status Achieved             PT Long Term Goals - 01/01/20 0943      PT LONG TERM GOAL #1   Title be independent in advanced HEP    Time 12    Period Weeks    Status On-going      PT LONG TERM GOAL #2   Title Pt will perform 5x sit to stand in less than or equal to 19  seconds for improved functional lower extremity strength.    Baseline 26.89  seconds    Time 12    Period Weeks    Status On-going      PT LONG TERM GOAL #3   Title perform TUG in < or = to 20 seconds to improve safety and balance    Baseline 25.05 seconds    Time 12    Period Weeks    Status On-going      PT LONG TERM GOAL #4   Title perform sit to stand without UE support due to improved functional LE strength    Baseline still requires min to mod UE support    Time 12    Period Weeks    Status On-going                 Plan - 01/20/20 1128    Clinical Impression Statement Pt reports she is going up and down her stairs at home "well" and is able to get up out of bed in the dark with good stability.  Pt requires some level of UE support with standing exercise and was able to stand without UE support with static standing.  We worked on increased step length and erect posture with walking and pt was able was able to walk x 8 minutes.  Pt requires frequent verbal cues and stand by assistance for safety.    PT Frequency 2x / week    PT Duration 12 weeks    PT Treatment/Interventions ADLs/Self Care Home Management;Cryotherapy;Gait training;Stair training;Functional mobility training;Neuromuscular re-education;Balance training;Therapeutic exercise;Therapeutic activities;Patient/family education;Manual techniques;Passive range of motion    PT Next Visit Plan work on sit to stand, endurance, strength, gait    PT Home Exercise Plan Access Code: [email or web address]    Recommended Other Services initial cert is signed    Consulted and Agree with Plan of Care Patient           Patient will benefit from skilled therapeutic intervention in order to improve the following deficits and impairments:  Abnormal gait, Decreased activity tolerance, Decreased balance, Decreased endurance, Decreased knowledge of precautions, Decreased mobility, Decreased range of motion, Decreased strength, Impaired  flexibility, Pain, Improper body mechanics, Difficulty walking  Visit  Diagnosis: Other abnormalities of gait and mobility  Muscle weakness (generalized)  Unsteadiness on feet     Problem List Patient Active Problem List   Diagnosis Date Noted  . Torn ear lobe, right, initial encounter 02/14/2018  . Gait abnormality 08/15/2017  . Memory loss 08/15/2017  . Chronic low back pain 07/01/2016  . Abnormality of gait 05/12/2016  . Paresthesia 05/12/2016  . OA (osteoarthritis) of knee 07/07/2014     Sigurd Sos, PT 01/20/20 11:38 AM  [facility] [address] [place], [place], [address] Phone: [phone]   Fax:  [phone]  Name: [name] MRN: [id number] Date of Birth: 05/25/1945

## 2020-01-22 ENCOUNTER — Encounter: Payer: Self-pay | Admitting: Physical Therapy

## 2020-01-22 ENCOUNTER — Other Ambulatory Visit: Payer: Self-pay

## 2020-01-22 ENCOUNTER — Ambulatory Visit: Payer: Medicare Other | Admitting: Physical Therapy

## 2020-01-22 DIAGNOSIS — M6281 Muscle weakness (generalized): Secondary | ICD-10-CM

## 2020-01-22 DIAGNOSIS — R2689 Other abnormalities of gait and mobility: Secondary | ICD-10-CM | POA: Diagnosis not present

## 2020-01-22 DIAGNOSIS — R2681 Unsteadiness on feet: Secondary | ICD-10-CM

## 2020-01-22 NOTE — Therapy (Signed)
[facility] [address], [place], [place], [address] Phone: [phone]   Fax:  [phone]  Physical Therapy Treatment  Patient Details  Name: [name] MRN: [id number] Date of Birth: 03-Jan-1946 Referring Provider (PT): Sela Hilding, MD   Encounter Date: 01/22/2020   PT End of Session - 01/22/20 1021    Visit Number 25    Date for PT Re-Evaluation 02/20/20    Authorization Type Medicare BCBS    PT Start Time 5427    PT Stop Time 1053    PT Time Calculation (min) 38 min    Activity Tolerance Patient tolerated treatment well    Behavior During Therapy [facility] for tasks assessed/performed           Past Medical History:  Diagnosis Date  . Anxiety   . Bipolar 1 disorder ([place])    followed by leslie o'neal NP  . DDD (degenerative disc disease), cervical   . Depression   . Diabetic retinopathy ([place])    mild right eye per pt  . History of asthma    childhood  . History of duodenal ulcer 1989   remote  . History of external beam radiation therapy    1995  left breast  . History of left breast cancer    1995  s/p  breast lumpectomy and radiation therapy  . Hyperlipidemia   . Hypertension   . OA (osteoarthritis)    hands  . PMB (postmenopausal bleeding)   . Type 2 diabetes mellitus treated with insulin [facility])    endocrinologist-- dr Chalmers Cater--- fasting sugar 140 (7 day average),  checks 4-5 times daily  . Urge urinary incontinence   . Wears glasses     Past Surgical History:  Procedure Laterality Date  . BREAST LUMPECTOMY  1995   left breast / radiation  . DILATION AND CURETTAGE OF UTERUS  1989  . HYSTEROSCOPY WITH D & C N/A 02/06/2019   Procedure: DILATATION AND CURETTAGE /HYSTEROSCOPY;  Surgeon: [name], MD;  Location: [facility];  Service: Gynecology;  Laterality: N/A;  . KNEE ARTHROSCOPY Right 2015  . TONSILLECTOMY  child  . TOTAL KNEE ARTHROPLASTY Right 07/07/2014    Procedure: RIGHT TOTAL KNEE ARTHROPLASTY;  Surgeon: [name], MD;  [facility];  Service: Orthopedics;  Laterality: Right;  . TOTAL KNEE ARTHROPLASTY Left 07/06/2015   Procedure: LEFT TOTAL KNEE ARTHROPLASTY;  Surgeon: Gaynelle Arabian, MD;  [facility];  Service: Orthopedics;  Laterality: Left;    There were no vitals filed for this visit.   Subjective Assessment - 01/22/20 1022    Subjective No new complaints    Pertinent History balance deficits    Currently in Pain? No/denies    Multiple Pain Sites No                             [facility] Adult PT Treatment/Exercise - 01/22/20 0001      Knee/Hip Exercises: Aerobic   Nustep L3 x10 min with PT present   VC/TC to correct RT trunk lean     Knee/Hip Exercises: Standing   Heel Raises Both;1 set;15 reps    Heel Raises Limitations Bil UE holding on.     Hip Abduction AROM;Stengthening;Both;1 set;10 reps;Knee straight      Knee/Hip Exercises: Seated   Clamshell with TheraBand --   yellow loop 20x: VC to make big   Hamstring Curl 1 set;15 reps  green band 2x10 Bil   Sit to Sand --   5# kettle bell 2x5     Manual Therapy   Manual therapy comments Orange roll to bil quads for cramps                    PT Short Term Goals - 01/20/20 1104      PT SHORT TERM GOAL #1   Title be independent in initial HEP    Status Achieved      PT SHORT TERM GOAL #4   Title stand with erect posture > or = to 50% of the time without cueing    Status Achieved             PT Long Term Goals - 01/01/20 0943      PT LONG TERM GOAL #1   Title be independent in advanced HEP    Time 12    Period Weeks    Status On-going      PT LONG TERM GOAL #2   Title Pt will perform 5x sit to stand in less than or equal to 19  seconds for improved functional lower extremity strength.    Baseline 26.89 seconds    Time 12    Period Weeks    Status On-going      PT LONG TERM GOAL #3   Title perform TUG in < or = to  20 seconds to improve safety and balance    Baseline 25.05 seconds    Time 12    Period Weeks    Status On-going      PT LONG TERM GOAL #4   Title perform sit to stand without UE support due to improved functional LE strength    Baseline still requires min to mod UE support    Time 12    Period Weeks    Status On-going                 Plan - 01/22/20 1022    Clinical Impression Statement Pt's control and safety with sit to stand improving but pace is about the same. Some cramping in quads when she began her standing exercises but a little light massage with the spikey roll helped abolished them.    Personal Factors and Comorbidities Comorbidity 2    Comorbidities chronic balance deficts, bipolar disorder, HTN    Examination-Activity Limitations Locomotion Level;Bathing;Squat;Stairs;Stand;Transfers    Examination-Participation Restrictions Driving;Community Activity;Cleaning;Shop    Stability/Clinical Decision Making Evolving/Moderate complexity    Rehab Potential Good    PT Frequency 2x / week    PT Duration 12 weeks    PT Treatment/Interventions ADLs/Self Care Home Management;Cryotherapy;Gait training;Stair training;Functional mobility training;Neuromuscular re-education;Balance training;Therapeutic exercise;Therapeutic activities;Patient/family education;Manual techniques;Passive range of motion    PT Next Visit Plan work on sit to stand, endurance, strength, gait    PT Home Exercise Plan Access Code: [email or web address]    Consulted and Agree with Plan of Care Patient           Patient will benefit from skilled therapeutic intervention in order to improve the following deficits and impairments:  Abnormal gait, Decreased activity tolerance, Decreased balance, Decreased endurance, Decreased knowledge of precautions, Decreased mobility, Decreased range of motion, Decreased strength, Impaired flexibility, Pain, Improper body mechanics, Difficulty walking  Visit Diagnosis: Other  abnormalities of gait and mobility  Muscle weakness (generalized)  Unsteadiness on feet     Problem List Patient Active Problem List   Diagnosis Date Noted  . Torn ear  lobe, right, initial encounter 02/14/2018  . Gait abnormality 08/15/2017  . Memory loss 08/15/2017  . Chronic low back pain 07/01/2016  . Abnormality of gait 05/12/2016  . Paresthesia 05/12/2016  . OA (osteoarthritis) of knee 07/07/2014    [name], PTA 01/22/2020, 10:57 AM   [facility] [address] [place], [place], [address] Phone: [phone]   Fax:  [phone]  Name: [name] MRN: [id number] Date of Birth: 22-Nov-1945

## 2020-01-27 ENCOUNTER — Ambulatory Visit: Payer: Medicare Other

## 2020-01-27 ENCOUNTER — Other Ambulatory Visit: Payer: Self-pay

## 2020-01-27 DIAGNOSIS — R2681 Unsteadiness on feet: Secondary | ICD-10-CM

## 2020-01-27 DIAGNOSIS — M6281 Muscle weakness (generalized): Secondary | ICD-10-CM

## 2020-01-27 DIAGNOSIS — R2689 Other abnormalities of gait and mobility: Secondary | ICD-10-CM | POA: Diagnosis not present

## 2020-01-27 NOTE — Therapy (Signed)
[facility] [address] [place], [place], [address] Phone: [phone]   Fax:  [phone]  Physical Therapy Treatment  Patient Details  Name: [name] MRN: [id number] Date of Birth: 10-Aug-1945 Referring Provider (PT): Sela Hilding, MD   Encounter Date: 01/27/2020   PT End of Session - 01/27/20 1134    Visit Number 26    Date for PT Re-Evaluation 02/20/20    Authorization Type Medicare BCBS    PT Start Time 1100    PT Stop Time 1139    PT Time Calculation (min) 39 min    Activity Tolerance Patient tolerated treatment well    Behavior During Therapy [facility] for tasks assessed/performed           Past Medical History:  Diagnosis Date  . Anxiety   . Bipolar 1 disorder ([place])    followed by leslie o'neal NP  . DDD (degenerative disc disease), cervical   . Depression   . Diabetic retinopathy ([place])    mild right eye per pt  . History of asthma    childhood  . History of duodenal ulcer 1989   remote  . History of external beam radiation therapy    1995  left breast  . History of left breast cancer    1995  s/p  breast lumpectomy and radiation therapy  . Hyperlipidemia   . Hypertension   . OA (osteoarthritis)    hands  . PMB (postmenopausal bleeding)   . Type 2 diabetes mellitus treated with insulin [facility])    endocrinologist-- dr Chalmers Cater--- fasting sugar 140 (7 day average),  checks 4-5 times daily  . Urge urinary incontinence   . Wears glasses     Past Surgical History:  Procedure Laterality Date  . BREAST LUMPECTOMY  1995   left breast / radiation  . DILATION AND CURETTAGE OF UTERUS  1989  . HYSTEROSCOPY WITH D & C N/A 02/06/2019   Procedure: DILATATION AND CURETTAGE /HYSTEROSCOPY;  Surgeon: [name], MD;  Location: [facility];  Service: Gynecology;  Laterality: N/A;  . KNEE ARTHROSCOPY Right 2015  . TONSILLECTOMY  child  . TOTAL KNEE ARTHROPLASTY Right 07/07/2014    Procedure: RIGHT TOTAL KNEE ARTHROPLASTY;  Surgeon: [name], MD;  [facility];  Service: Orthopedics;  Laterality: Right;  . TOTAL KNEE ARTHROPLASTY Left 07/06/2015   Procedure: LEFT TOTAL KNEE ARTHROPLASTY;  Surgeon: Gaynelle Arabian, MD;  [facility];  Service: Orthopedics;  Laterality: Left;    There were no vitals filed for this visit.   Subjective Assessment - 01/27/20 1102    Subjective I'm doing good.    Currently in Pain? No/denies                             [facility] Adult PT Treatment/Exercise - 01/27/20 0001      Knee/Hip Exercises: Aerobic   Recumbent Bike Level 0 x 8 minutes    Nustep NuStep not available       Knee/Hip Exercises: Standing   Hip Abduction AROM;Stengthening;Both;1 set;10 reps;Knee straight      Knee/Hip Exercises: Seated   Clamshell with TheraBand --   yellow loop 20x: VC to make big   Hamstring Curl 1 set;15 reps   green band 2x10 Bil   Abd/Adduction Limitations shoulder flexion with 5# kettle bell 2x10    Sit to Sand 2 sets;10 reps   max verbal cueing for  technique                   PT Short Term Goals - 01/20/20 1104      PT SHORT TERM GOAL #1   Title be independent in initial HEP    Status Achieved      PT SHORT TERM GOAL #4   Title stand with erect posture > or = to 50% of the time without cueing    Status Achieved             PT Long Term Goals - 01/27/20 1102      PT LONG TERM GOAL #1   Title be independent in advanced HEP    Time 12    Period Weeks    Status On-going                 Plan - 01/27/20 1112    Clinical Impression Statement Pt was able to perform sit to stand with good form ~50% of the time with stand by assistance and max cueing from PT.  Pt continues to require max cueing from PT for exercise in the clinic.  Anxiety impacts pt's ability to perform standing exercises due to fear of falling.  Pt will continue to benefit from skilled PT to address strength, gait and  balance to improve safety with home and community tasks    Rehab Potential Good    PT Frequency 2x / week    PT Duration 12 weeks    PT Treatment/Interventions ADLs/Self Care Home Management;Cryotherapy;Gait training;Stair training;Functional mobility training;Neuromuscular re-education;Balance training;Therapeutic exercise;Therapeutic activities;Patient/family education;Manual techniques;Passive range of motion    PT Next Visit Plan work on sit to stand, endurance, strength, gait    PT Home Exercise Plan Access Code: [email or web address]    Consulted and Agree with Plan of Care Patient           Patient will benefit from skilled therapeutic intervention in order to improve the following deficits and impairments:  Abnormal gait, Decreased activity tolerance, Decreased balance, Decreased endurance, Decreased knowledge of precautions, Decreased mobility, Decreased range of motion, Decreased strength, Impaired flexibility, Pain, Improper body mechanics, Difficulty walking  Visit Diagnosis: Other abnormalities of gait and mobility  Muscle weakness (generalized)  Unsteadiness on feet     Problem List Patient Active Problem List   Diagnosis Date Noted  . Torn ear lobe, right, initial encounter 02/14/2018  . Gait abnormality 08/15/2017  . Memory loss 08/15/2017  . Chronic low back pain 07/01/2016  . Abnormality of gait 05/12/2016  . Paresthesia 05/12/2016  . OA (osteoarthritis) of knee 07/07/2014    Sigurd Sos, PT 01/27/20 11:36 AM  [facility] [address] [place], [place], [address] Phone: [phone]   Fax:  [phone]  Name: [name] MRN: [id number] Date of Birth: 1945/06/05

## 2020-01-29 ENCOUNTER — Other Ambulatory Visit: Payer: Self-pay

## 2020-01-29 ENCOUNTER — Encounter: Payer: Self-pay | Admitting: Physical Therapy

## 2020-01-29 ENCOUNTER — Ambulatory Visit: Payer: Medicare Other | Admitting: Physical Therapy

## 2020-01-29 DIAGNOSIS — M6281 Muscle weakness (generalized): Secondary | ICD-10-CM

## 2020-01-29 DIAGNOSIS — R2689 Other abnormalities of gait and mobility: Secondary | ICD-10-CM | POA: Diagnosis not present

## 2020-01-29 DIAGNOSIS — R2681 Unsteadiness on feet: Secondary | ICD-10-CM

## 2020-01-29 NOTE — Therapy (Signed)
[facility] [address], [place], [place], [address] Phone: [phone]   Fax:  [phone]  Physical Therapy Treatment  Patient Details  Name: [name] MRN: [id number] Date of Birth: 1945-08-03 Referring Provider (PT): Courtney Hilding, MD   Encounter Date: 01/29/2020   PT End of Session - 01/29/20 1014    Visit Number 27    Date for PT Re-Evaluation 02/20/20    Authorization Type Medicare BCBS    PT Start Time 1014    PT Stop Time 1055    PT Time Calculation (min) 41 min    Activity Tolerance Patient tolerated treatment well;Other (comment)   Difficulty rememebring what she was doing, redirection   Behavior During Therapy [facility] for tasks assessed/performed           Past Medical History:  Diagnosis Date  . Anxiety   . Bipolar 1 disorder ([place])    followed by Courtney o'neal NP  . DDD (degenerative disc disease), cervical   . Depression   . Diabetic retinopathy ([place])    mild right eye per pt  . History of asthma    childhood  . History of duodenal ulcer 1989   remote  . History of external beam radiation therapy    1995  left breast  . History of left breast cancer    1995  s/p  breast lumpectomy and radiation therapy  . Hyperlipidemia   . Hypertension   . OA (osteoarthritis)    hands  . PMB (postmenopausal bleeding)   . Type 2 diabetes mellitus treated with insulin [name])    endocrinologist-- [name]--- fasting sugar 140 (7 day average),  checks 4-5 times daily  . Urge urinary incontinence   . Wears glasses     Past Surgical History:  Procedure Laterality Date  . BREAST LUMPECTOMY  1995   left breast / radiation  . DILATION AND CURETTAGE OF UTERUS  1989  . HYSTEROSCOPY WITH D & C N/A 02/06/2019   Procedure: DILATATION AND CURETTAGE /HYSTEROSCOPY;  Surgeon: Courtney Pearson, MD;  Location: [place];  Service: Gynecology;  Laterality: N/A;  . KNEE ARTHROSCOPY Right 2015   . TONSILLECTOMY  child  . TOTAL KNEE ARTHROPLASTY Right 07/07/2014   Procedure: RIGHT TOTAL KNEE ARTHROPLASTY;  Surgeon: Courtney Alf, MD;  [facility];  Service: Orthopedics;  Laterality: Right;  . TOTAL KNEE ARTHROPLASTY Left 07/06/2015   Procedure: LEFT TOTAL KNEE ARTHROPLASTY;  Surgeon: Courtney Arabian, MD;  Location[facility];  Service: Orthopedics;  Laterality: Left;    There were no vitals filed for this visit.   Subjective Assessment - 01/29/20 1015    Subjective I'm doing good.    Pertinent History balance deficits    Currently in Pain? No/denies   Lt arm is sore from booster shot yesterday                            [facility] Adult PT Treatment/Exercise - 01/29/20 0001      Knee/Hip Exercises: Aerobic   Nustep L3 x10 min with PT present   VC/TC to correct RT trunk lean     Knee/Hip Exercises: Standing   Heel Raises Both;1 set;15 reps    Heel Raises Limitations Bil UE holding on.     Hip Abduction AROM;Stengthening;Both;1 set;10 reps;Knee straight   Vc to correct plane     Knee/Hip Exercises: Seated   Clamshell with TheraBand --  yellow loop 20x: VC to make big   Hamstring Curl 1 set;15 reps   green band 2x10 Bil   Abd/Adduction Limitations shoulder flexion with 5# kettle bell 2x10    Sit to Sand 2 sets;10 reps   max verbal cueing for technique                   PT Short Term Goals - 01/20/20 1104      PT SHORT TERM GOAL #1   Title be independent in initial HEP    Status Achieved      PT SHORT TERM GOAL #4   Title stand with erect posture > or = to 50% of the time without cueing    Status Achieved             PT Long Term Goals - 01/27/20 1102      PT LONG TERM GOAL #1   Title be independent in advanced HEP    Time 12    Period Weeks    Status On-going                 Plan - 01/29/20 1014    Clinical Impression Statement Pt required more verbal cuing to do exercise/activity correctly in addition to staying on  task than she had in previous sessions. Pt a bit slower with all mobility today. PTA verbally encouraged making bigger movements.    Personal Factors and Comorbidities Comorbidity 2    Comorbidities chronic balance deficts, bipolar disorder, HTN    Examination-Activity Limitations Locomotion Level;Bathing;Squat;Stairs;Stand;Transfers    Examination-Participation Restrictions Driving;Community Activity;Cleaning;Shop    Stability/Clinical Decision Making Evolving/Moderate complexity    Rehab Potential Good    PT Duration 12 weeks    PT Treatment/Interventions ADLs/Self Care Home Management;Cryotherapy;Gait training;Stair training;Functional mobility training;Neuromuscular re-education;Balance training;Therapeutic exercise;Therapeutic activities;Patient/family education;Manual techniques;Passive range of motion    PT Next Visit Plan work on sit to stand, endurance, strength, gait    PT Home Exercise Plan Access Code: [email or web address]    Consulted and Agree with Plan of Care Patient           Patient will benefit from skilled therapeutic intervention in order to improve the following deficits and impairments:  Abnormal gait, Decreased activity tolerance, Decreased balance, Decreased endurance, Decreased knowledge of precautions, Decreased mobility, Decreased range of motion, Decreased strength, Impaired flexibility, Pain, Improper body mechanics, Difficulty walking  Visit Diagnosis: Other abnormalities of gait and mobility  Muscle weakness (generalized)  Unsteadiness on feet     Problem List Patient Active Problem List   Diagnosis Date Noted  . Torn ear lobe, right, initial encounter 02/14/2018  . Gait abnormality 08/15/2017  . Memory loss 08/15/2017  . Chronic low back pain 07/01/2016  . Abnormality of gait 05/12/2016  . Paresthesia 05/12/2016  . OA (osteoarthritis) of knee 07/07/2014    [name], PTA 01/29/2020, 10:51 AM  [facility] [address], [place], [place], [address] Phone: [phone]   Fax:  [phone]  Name: [name] MRN: [id number] Date of Birth: 12-26-1945

## 2020-02-03 ENCOUNTER — Encounter: Payer: Self-pay | Admitting: Physical Therapy

## 2020-02-03 ENCOUNTER — Ambulatory Visit: Payer: Medicare Other | Admitting: Physical Therapy

## 2020-02-03 ENCOUNTER — Other Ambulatory Visit: Payer: Self-pay

## 2020-02-03 DIAGNOSIS — R2681 Unsteadiness on feet: Secondary | ICD-10-CM

## 2020-02-03 DIAGNOSIS — R2689 Other abnormalities of gait and mobility: Secondary | ICD-10-CM | POA: Diagnosis not present

## 2020-02-03 DIAGNOSIS — M6281 Muscle weakness (generalized): Secondary | ICD-10-CM

## 2020-02-03 NOTE — Therapy (Signed)
[facility] [address], [place], [place], [address] Phone: [phone]   Fax:  [phone]  Physical Therapy Treatment  Patient Details  Name: [name] MRN: [id number] Date of Birth: Sep 22, 1945 Referring Provider (PT): Sela Hilding, MD   Encounter Date: 02/03/2020   PT End of Session - 02/03/20 1145    Visit Number 28    Date for PT Re-Evaluation 02/20/20    Authorization Type Medicare BCBS    PT Start Time 1140    PT Stop Time 1221    PT Time Calculation (min) 41 min    Activity Tolerance Patient tolerated treatment well;Other (comment)    Behavior During Therapy [facility] for tasks assessed/performed           Past Medical History:  Diagnosis Date  . Anxiety   . Bipolar 1 disorder ([place])    followed by leslie o'neal NP  . DDD (degenerative disc disease), cervical   . Depression   . Diabetic retinopathy ([place])    mild right eye per pt  . History of asthma    childhood  . History of duodenal ulcer 1989   remote  . History of external beam radiation therapy    1995  left breast  . History of left breast cancer    1995  s/p  breast lumpectomy and radiation therapy  . Hyperlipidemia   . Hypertension   . OA (osteoarthritis)    hands  . PMB (postmenopausal bleeding)   . Type 2 diabetes mellitus treated with insulin [facility])    endocrinologist-- dr Chalmers Cater--- fasting sugar 140 (7 day average),  checks 4-5 times daily  . Urge urinary incontinence   . Wears glasses     Past Surgical History:  Procedure Laterality Date  . BREAST LUMPECTOMY  1995   left breast / radiation  . DILATION AND CURETTAGE OF UTERUS  1989  . HYSTEROSCOPY WITH D & C N/A 02/06/2019   Procedure: DILATATION AND CURETTAGE /HYSTEROSCOPY;  Surgeon: [name], MD;  Location: [place];  Service: Gynecology;  Laterality: N/A;  . KNEE ARTHROSCOPY Right 2015  . TONSILLECTOMY  child  . TOTAL KNEE ARTHROPLASTY  Right 07/07/2014   Procedure: RIGHT TOTAL KNEE ARTHROPLASTY;  Surgeon: [name], MD;  [facility];  Service: Orthopedics;  Laterality: Right;  . TOTAL KNEE ARTHROPLASTY Left 07/06/2015   Procedure: LEFT TOTAL KNEE ARTHROPLASTY;  Surgeon: Gaynelle Arabian, MD;  [facility];  Service: Orthopedics;  Laterality: Left;    There were no vitals filed for this visit.   Subjective Assessment - 02/03/20 1146    Subjective No new complaints    Currently in Pain? No/denies                             [facility] Adult PT Treatment/Exercise - 02/03/20 0001      Knee/Hip Exercises: Aerobic   Nustep L3 x10 min with PT present   VC/TC to correct RT trunk lean     Knee/Hip Exercises: Standing   Heel Raises Both;1 set;15 reps    Heel Raises Limitations Bil UE holding on.     Hip Flexion AROM;Both;1 set;10 reps;Knee bent    Hip Flexion Limitations Redirection to task/VC to complete reps/stay on task    Hip Abduction AROM;Stengthening;Both;1 set;10 reps;Knee straight   Vc to correct plane   Abduction Limitations VC and redirection to stay on task  Gait Training Walking with rollator x 3 min with VC for posture      Knee/Hip Exercises: Seated   Ball Squeeze 20x      Shoulder Exercises: Seated   Flexion AROM;Both;10 reps    Flexion Limitations for posture/stand upright                    PT Short Term Goals - 01/20/20 1104      PT SHORT TERM GOAL #1   Title be independent in initial HEP    Status Achieved      PT SHORT TERM GOAL #4   Title stand with erect posture > or = to 50% of the time without cueing    Status Achieved             PT Long Term Goals - 01/27/20 1102      PT LONG TERM GOAL #1   Title be independent in advanced HEP    Time 12    Period Weeks    Status On-going                 Plan - 02/03/20 1145    Clinical Impression Statement pt needed frequent redirection to task today. Also, pt will stop exercise after a few  reps then need to be redirected back to task. Pt slow with all movements needs continual verbal cues to make her movements bigger.    Personal Factors and Comorbidities Comorbidity 2    Comorbidities chronic balance deficts, bipolar disorder, HTN    Examination-Activity Limitations Locomotion Level;Bathing;Squat;Stairs;Stand;Transfers    Examination-Participation Restrictions Driving;Community Activity;Cleaning;Shop    Stability/Clinical Decision Making Evolving/Moderate complexity    Rehab Potential Good    PT Frequency 2x / week    PT Duration 12 weeks    PT Treatment/Interventions ADLs/Self Care Home Management;Cryotherapy;Gait training;Stair training;Functional mobility training;Neuromuscular re-education;Balance training;Therapeutic exercise;Therapeutic activities;Patient/family education;Manual techniques;Passive range of motion    PT Next Visit Plan ERO next, goals, etc....consider DC to HEP    PT Home Exercise Plan Access Code: [email or web address]    Consulted and Agree with Plan of Care Patient           Patient will benefit from skilled therapeutic intervention in order to improve the following deficits and impairments:  Abnormal gait, Decreased activity tolerance, Decreased balance, Decreased endurance, Decreased knowledge of precautions, Decreased mobility, Decreased range of motion, Decreased strength, Impaired flexibility, Pain, Improper body mechanics, Difficulty walking  Visit Diagnosis: Other abnormalities of gait and mobility  Muscle weakness (generalized)  Unsteadiness on feet     Problem List Patient Active Problem List   Diagnosis Date Noted  . Torn ear lobe, right, initial encounter 02/14/2018  . Gait abnormality 08/15/2017  . Memory loss 08/15/2017  . Chronic low back pain 07/01/2016  . Abnormality of gait 05/12/2016  . Paresthesia 05/12/2016  . OA (osteoarthritis) of knee 07/07/2014    Stevan Eberwein, PTA 02/03/2020, 12:26 PM  [facility] [address] [place], [place], [address] Phone: [phone]   Fax:  [phone]  Name: JONIECE SMOTHERMAN MRN: [id number] Date of Birth: 08-07-1945

## 2020-02-05 ENCOUNTER — Other Ambulatory Visit: Payer: Self-pay

## 2020-02-05 ENCOUNTER — Ambulatory Visit: Payer: Medicare Other

## 2020-02-05 DIAGNOSIS — M6281 Muscle weakness (generalized): Secondary | ICD-10-CM

## 2020-02-05 DIAGNOSIS — R2681 Unsteadiness on feet: Secondary | ICD-10-CM

## 2020-02-05 DIAGNOSIS — R2689 Other abnormalities of gait and mobility: Secondary | ICD-10-CM | POA: Diagnosis not present

## 2020-02-05 NOTE — Therapy (Signed)
[facility] [address] [place], [place], [address] Phone: [phone]   Fax:  [phone]  Physical Therapy Treatment  Patient Details  Name: [name] MRN: [id number] Date of Birth: [birth year] Referring Provider (PT): Sela Hilding, MD   Encounter Date: 02/05/2020   PT End of Session - 02/05/20 1053    Visit Number 29    PT Start Time 7588    PT Stop Time 1054    PT Time Calculation (min) 35 min    Activity Tolerance Patient tolerated treatment well;Other (comment)    Behavior During Therapy [facility] for tasks assessed/performed;Anxious           Past Medical History:  Diagnosis Date  . Anxiety   . Bipolar 1 disorder ([place])    followed by leslie o'neal NP  . DDD (degenerative disc disease), cervical   . Depression   . Diabetic retinopathy ([place])    mild right eye per pt  . History of asthma    childhood  . History of duodenal ulcer 1989   remote  . History of external beam radiation therapy    1995  left breast  . History of left breast cancer    1995  s/p  breast lumpectomy and radiation therapy  . Hyperlipidemia   . Hypertension   . OA (osteoarthritis)    hands  . PMB (postmenopausal bleeding)   . Type 2 diabetes mellitus treated with insulin [facility])    endocrinologist-- dr Chalmers Cater--- fasting sugar 140 (7 day average),  checks 4-5 times daily  . Urge urinary incontinence   . Wears glasses     Past Surgical History:  Procedure Laterality Date  . BREAST LUMPECTOMY  1995   left breast / radiation  . DILATION AND CURETTAGE OF UTERUS  1989  . HYSTEROSCOPY WITH D & C N/A 02/06/2019   Procedure: DILATATION AND CURETTAGE /HYSTEROSCOPY;  Surgeon: [name], MD;  Location: [place];  Service: Gynecology;  Laterality: N/A;  . KNEE ARTHROSCOPY Right 2015  . TONSILLECTOMY  child  . TOTAL KNEE ARTHROPLASTY Right 07/07/2014   Procedure: RIGHT TOTAL KNEE ARTHROPLASTY;  Surgeon:  [name], MD;  [facility];  Service: Orthopedics;  Laterality: Right;  . TOTAL KNEE ARTHROPLASTY Left 07/06/2015   Procedure: LEFT TOTAL KNEE ARTHROPLASTY;  Surgeon: Gaynelle Arabian, MD;  [facility];  Service: Orthopedics;  Laterality: Left;    There were no vitals filed for this visit.       [facility] PT Assessment - 02/05/20 0001      Assessment   Medical Diagnosis frequent falls    Referring Provider (PT) Sela Hilding, MD    Next MD Visit none      Precautions   Precautions [place] residence    Living Arrangements Spouse/significant other      Transfers   Five time sit to stand comments  --   Pt not able to complete with control today- unable to test.                        [facility] Adult PT Treatment/Exercise - 02/05/20 0001      Knee/Hip Exercises: Aerobic   Nustep L3 x10 min with PT present   verbal cues to stay on task     Knee/Hip Exercises: [place] Strengthening;Both;2 sets;20 reps;Weights    Long  Arc Quad Weight 3 lbs.    Clamshell with TheraBand Red   2x10   Marching Strengthening;Right;Left;Weights;20 reps    Marching Limitations 3      Shoulder Exercises: Seated   External Rotation Strengthening;Both;10 reps    Flexion AROM;Both;10 reps    Flexion Weight (lbs) 1    Flexion Limitations for posture/stand upright                    PT Short Term Goals - 01/20/20 1104      PT SHORT TERM GOAL #1   Title be independent in initial HEP    Status Achieved      PT SHORT TERM GOAL #4   Title stand with erect posture > or = to 50% of the time without cueing    Status Achieved             PT Long Term Goals - 02/05/20 1017      PT LONG TERM GOAL #1   Title be independent in advanced HEP    Baseline Not consistent due to memory    Status Achieved      PT LONG TERM GOAL #2   Title Pt will perform 5x sit to stand in less than or equal to 19   seconds for improved functional lower extremity strength.    Baseline 26.89 seconds-best score over the past month    Status Partially Met      PT LONG TERM GOAL #3   Title perform TUG in < or = to 20 seconds to improve safety and balance    Baseline 25.05 seconds- most recent time    Status Partially Met      PT LONG TERM GOAL #4   Title perform sit to stand without UE support due to improved functional LE strength    Baseline still requires min to mod UE support    Status Not Met                 Plan - 02/05/20 1044    Clinical Impression Statement Pt needed frequent redirection to task today. Also, pt will stop exercise after a few reps then need to be redirected back to task. Pt slow with all movements needs continual verbal cues to make her movements bigger.  Not able to test 5x sit to stand due to anxiety with this and uncontrolled technique.  Session spent reviewing HEP to ensure that pt will continue with exercises after D/C.  PT discussed importance of consistency and compliance for continued strength gains.  Pt has not made gains with sit to stand due to anxiety when asked to perform this task and performance of exercises is variable form session to session.  Pt will D/C to HEP today.    PT Next Visit Plan D/C PT to HEP    PT Home Exercise Plan Access Code: [email or web address]    Consulted and Agree with Plan of Care Patient           Patient will benefit from skilled therapeutic intervention in order to improve the following deficits and impairments:     Visit Diagnosis: Other abnormalities of gait and mobility  Muscle weakness (generalized)  Unsteadiness on feet     Problem List Patient Active Problem List   Diagnosis Date Noted  . Torn ear lobe, right, initial encounter 02/14/2018  . Gait abnormality 08/15/2017  . Memory loss 08/15/2017  . Chronic low back pain 07/01/2016  . Abnormality of gait 05/12/2016  .  Paresthesia 05/12/2016  . OA (osteoarthritis) of knee  07/07/2014   PHYSICAL THERAPY DISCHARGE SUMMARY  Visits from Start of Care: 29  Current functional level related to goals / functional outcomes: See above for current status.  Pt will d/C to HEP due to plateau with PT services.     Remaining deficits: Falls risk, balance deficits, functional weakness.   Education / Equipment: HEP, posure Plan: Patient agrees to discharge.  Patient goals were not met. Patient is being discharged due to being pleased with the current functional level.  ?????       Sigurd Sos, PT 02/05/20 10:58 AM  [facility] [address], [place], [place], [address] Phone: [phone]   Fax:  [phone]  Name: [name] MRN: [id number] Date of Birth: 1945/06/13

## 2020-04-17 ENCOUNTER — Telehealth: Payer: Self-pay | Admitting: Neurology

## 2020-04-17 NOTE — Telephone Encounter (Signed)
Pt's husband, Jennett [place] (on [place]) called, PCP, Dr. Lindell Noe wanted to know if a MRI would be helpful to look for [facility]. Would like a call from the nurse.

## 2020-04-20 NOTE — Telephone Encounter (Signed)
I returned the call to the patient's husband. Reports his wife has continued having worsening gait and memory issues. She recently had her yearly physical with PCP, Dr. Lindell Noe, who has placed a referral back to our office. She has a pending appt with Dr. Krista Blue on 05/21/20. She has also been added to the wait list.

## 2020-04-20 NOTE — Telephone Encounter (Signed)
Please call, last visit with our office was July 2019,  She did have MRI of the brain in April 2019, which only showed mild age-related changes, generalized atrophy, there was no acute abnormality  If she has any significant change may consider repeat MRI, if there is a continuation of all problems, less likely MRI will be high yield   IMPRESSION:  This MRI of the brain without contrast shows the following: 1.  Some scattered T2/FLAIR hyperintense foci mostly in the deep white matter of the hemispheres consistent with mild chronic microvascular ischemic change..    2.  Mild generalized cortical atrophy, slightly more than typical for age.

## 2020-05-06 ENCOUNTER — Other Ambulatory Visit: Payer: Self-pay | Admitting: Family Medicine

## 2020-05-06 DIAGNOSIS — R413 Other amnesia: Secondary | ICD-10-CM

## 2020-05-06 DIAGNOSIS — R296 Repeated falls: Secondary | ICD-10-CM

## 2020-05-21 ENCOUNTER — Ambulatory Visit (INDEPENDENT_AMBULATORY_CARE_PROVIDER_SITE_OTHER): Payer: Medicare Other | Admitting: Neurology

## 2020-05-21 ENCOUNTER — Other Ambulatory Visit: Payer: Self-pay

## 2020-05-21 ENCOUNTER — Encounter: Payer: Self-pay | Admitting: Neurology

## 2020-05-21 ENCOUNTER — Telehealth: Payer: Self-pay | Admitting: Neurology

## 2020-05-21 VITALS — BP 125/62 | HR 65 | Ht 64.75 in | Wt 148.5 lb

## 2020-05-21 DIAGNOSIS — R413 Other amnesia: Secondary | ICD-10-CM

## 2020-05-21 DIAGNOSIS — R269 Unspecified abnormalities of gait and mobility: Secondary | ICD-10-CM

## 2020-05-21 DIAGNOSIS — R350 Frequency of micturition: Secondary | ICD-10-CM | POA: Diagnosis not present

## 2020-05-21 DIAGNOSIS — N3946 Mixed incontinence: Secondary | ICD-10-CM | POA: Diagnosis not present

## 2020-05-21 DIAGNOSIS — R351 Nocturia: Secondary | ICD-10-CM | POA: Diagnosis not present

## 2020-05-21 NOTE — Progress Notes (Addendum)
Chief Complaint  Patient presents with  . Consult    MMSE 28/30 - animals. She is here with her husband, [name], to have her worsening memory evaluated.    HISTORICAL [name] is a 74 year old right-handed female, accompanied by her husband seen in refer by her primary care physician Dr. Sela Cooke for evaluation of worsening gait abnormality  I reviewed and summarized referring note, and previous history.  I saw her initially in January 2018, She had long history of paranoid schizophrenia, diagnosis was made in 1974, she has been treated with different antipsychotic medications, was on a stable dose of an Abilify 30 mg every day since 2002, per patient she was also on clonazepam 1 mg in the morning, 2 mg every night for many years  She also had a past medical history of diabetes, hyperlipidemia, left breast cancer in 1995, status post lobectomy followed by radiation therapy, knee replacement  Following her most recent left knee replacement in 2017, she complains of continued gait abnormality, initially it was contributed to her knee pain, but failed to improve after she recovered from knee replacement, during previous visit, she also complains of intermittent urinary incontinence, wearing pads,  On examination, she was noted to have variable effort, especially when evaluating her gait,  Electrodiagnostic study in 2018, which showed no significant abnormality, in specific, there is no evidence of large fiber peripheral neuropathy or bilateral lumbosacral radiculopathy.  Personally reviewed MRI of the cervical in February 2018, multilevel degenerative disc disease no significant canal or foraminal stenosis. MRI of the brain April 2019 showed mild generalized atrophy, scattered supratentorium small vessel disease.  She had some major changes in her psychiatric management since beginning of 2020, she changed to different psychiatrist, clonazepam 3 mg daily was stopped fairly  abruptly, she was put on Depakote ER now taking 500 mg every night, continued Abilify 30 mg daily  Concurrent with her change of medication and providers, her husband noticed significant change in patient's functional status, she tends to talk to herself, mumbling, carry on a conversation all day long, she denies visual auditory hallucination, but has increased gait abnormality, worsening incontinence, for a while, she uses multiple depends in the day, now has some improvement,  She denies significant pain, has childish talking during the interview, MRI is pending end of January 2021  She has mild improvement in her bladder issues since starting Myrbetriq few months ago, but she tends to center her activity around using bathroom, stay in bathroom for extended period of time, she is so afraid of falling, become less mobile and functional at home, has quit driving since change of medication, physical therapy provides some help, but she was not able to keep up with exercise routine  Laboratory evaluations:2020, normal CBC, hemoglobin of 12.1, normal BMP  REVIEW OF SYSTEMS: Full 14 system review of systems performed and notable only for as above All other review of systems were negative.  ALLERGIES: Allergies  Allergen Reactions  . Ciprofloxacin Hcl Rash  . Latex Rash  . Penicillins Rash    Has patient had a PCN reaction causing immediate rash, facial/tongue/throat swelling, SOB or lightheadedness with hypotension: Yes Has patient had a PCN reaction causing severe rash involving mucus membranes or skin necrosis: No Has patient had a PCN reaction that required hospitalization No Has patient had a PCN reaction occurring within the last 10 years: Yes If all of the above answers are "NO", then may proceed with Cephalosporin use.  . Sulfa Antibiotics  Rash    HOME MEDICATIONS: Current Outpatient Medications  Medication Sig Dispense Refill  . acetaminophen (TYLENOL) 500 MG tablet Take 500 mg by  mouth every 6 (six) hours as needed.    . ARIPiprazole (ABILIFY) 30 MG tablet Take 30 mg by mouth at bedtime.     . Calcium-Phosphorus-Vitamin D (CITRACAL +D3 PO) Take 2 tablets by mouth daily.    . chlorpheniramine (CHLOR-TRIMETON) 4 MG tablet Take 4 mg by mouth 2 (two) times daily.    . Coenzyme Q10 (COQ-10 PO) Take 300 mg by mouth daily.    . Cyanocobalamin (B-12) 1000 MCG TABS Take 1 tablet by mouth once a week.    . divalproex (DEPAKOTE ER) 500 MG 24 hr tablet Take 1 tablet by mouth daily.    Marland Kitchen ibuprofen (ADVIL) 600 MG tablet Take 1 tablet (600 mg total) by mouth every 6 (six) hours as needed for mild pain. 30 tablet 0  . insulin lispro protamine-lispro (HUMALOG 75/25 MIX) (75-25) 100 UNIT/ML SUSP injection Inject 8 Units into the skin 2 (two) times daily. Sliding scale depending on time of day and what eating.    Marland Kitchen KRILL OIL OMEGA-3 PO Take by mouth daily.    . metFORMIN (GLUCOPHAGE-XR) 500 MG 24 hr tablet Take 1,000 mg by mouth 2 (two) times daily.   5  . mirabegron ER (MYRBETRIQ) 25 MG TB24 tablet Take 1 tablet by mouth daily.    . Misc Natural Products (GLUCOS-CHONDROIT-MSM COMPLEX PO) Take 2 tablets by mouth daily.    . Multiple Vitamins-Minerals (MULTIVITAMIN ADULTS 50+) TABS Take by mouth daily.    . Multiple Vitamins-Minerals (VITEYES AREDS ADVANCED) CAPS Take by mouth daily.    . pravastatin (PRAVACHOL) 20 MG tablet Take 20 mg by mouth daily after breakfast.     . ramipril (ALTACE) 5 MG capsule Take 5 mg by mouth at bedtime.     . sitaGLIPtin (JANUVIA) 100 MG tablet Take 100 mg by mouth daily with breakfast.     . Turmeric 450 MG CAPS Take 1 capsule by mouth daily.     No current facility-administered medications for this visit.    PAST MEDICAL HISTORY: Past Medical History:  Diagnosis Date  . Anxiety   . Bipolar 1 disorder ([place])    followed by Courtney o'neal NP  . DDD (degenerative disc disease), cervical   . Depression   . Diabetic retinopathy ([place])    mild right eye  per pt  . History of asthma    childhood  . History of duodenal ulcer 1989   remote  . History of external beam radiation therapy    1995  left breast  . History of left breast cancer    1995  s/p  breast lumpectomy and radiation therapy  . Hyperlipidemia   . Hypertension   . Memory loss   . OA (osteoarthritis)    hands  . PMB (postmenopausal bleeding)   . Type 2 diabetes mellitus treated with insulin [facility])    endocrinologist-- dr Chalmers Cater--- fasting sugar 140 (7 day average),  checks 4-5 times daily  . Urge urinary incontinence   . Wears glasses     PAST SURGICAL HISTORY: Past Surgical History:  Procedure Laterality Date  . BREAST LUMPECTOMY  1995   left breast / radiation  . DILATION AND CURETTAGE OF UTERUS  1989  . HYSTEROSCOPY WITH D & C N/A 02/06/2019   Procedure: DILATATION AND CURETTAGE /HYSTEROSCOPY;  Surgeon: [name], MD;  Location: [facility];  Service: Gynecology;  Laterality: N/A;  . KNEE ARTHROSCOPY Right 2015  . TONSILLECTOMY  child  . TOTAL KNEE ARTHROPLASTY Right 07/07/2014   Procedure: RIGHT TOTAL KNEE ARTHROPLASTY;  Surgeon: [name], MD;  Location: [facility];  Service: Orthopedics;  Laterality: Right;  . TOTAL KNEE ARTHROPLASTY Left 07/06/2015   Procedure: LEFT TOTAL KNEE ARTHROPLASTY;  Surgeon: Gaynelle Arabian, MD;  Location: [facility];  Service: Orthopedics;  Laterality: Left;    FAMILY HISTORY: Family History  Problem Relation Age of Onset  . Schizophrenia Mother   . Prostate cancer Father   . Aneurysm Father        stomach  . Diabetes Father   . Colitis Paternal Grandmother     SOCIAL HISTORY: Social History   Socioeconomic History  . Marital status: Married    Spouse name: Not on file  . Number of children: 1  . Years of education: Associates  . Highest education level: Not on file  Occupational History  . Occupation: Retired  Tobacco Use  . Smoking status: Never Smoker  . Smokeless tobacco: Never Used  Vaping Use   . Vaping Use: Never used  Substance and Sexual Activity  . Alcohol use: Yes    Comment: occasional  . Drug use: Never  . Sexual activity: Not on file  Other Topics Concern  . Not on file  Social History Narrative   Lives at home with husband.   Right-handed.   3-4 cups caffeine.   Social Determinants of Health   Financial Resource Strain: Not on file  Food Insecurity: Not on file  Transportation Needs: Not on file  Physical Activity: Not on file  Stress: Not on file  Social Connections: Not on file  Intimate Partner Violence: Not on file     PHYSICAL EXAM   Vitals:   05/21/20 0735  BP: 125/62  Pulse: 65  Weight: 148 lb 8 oz (67.4 kg)  Height: 5' 4.75" (1.645 m)   Not recorded     Body mass index is 24.9 kg/m.  PHYSICAL EXAMNIATION:  Gen: NAD, conversant, well nourised, well groomed                     Cardiovascular: Regular rate rhythm, no peripheral edema, warm, nontender. Eyes: Conjunctivae clear without exudates or hemorrhage Neck: Supple, no carotid bruits. Pulmonary: Clear to auscultation bilaterally   NEUROLOGICAL EXAM:  MENTAL STATUS: Speech/cognition: Following commands, childish talking, loss of rhythm frequently,  MMSE - Mini Mental State Exam 05/21/2020  Orientation to time 4  Orientation to Place 5  Registration 3  Attention/ Calculation 5  Recall 2  Language- name 2 objects 2  Language- repeat 1  Language- follow 3 step command 3  Language- read & follow direction 1  Write a sentence 1  Copy design 1  Total score 28  animal naming 8  CRANIAL NERVES: CN II: Visual fields are full to confrontation. Pupils are round equal and briskly reactive to light. CN III, IV, VI: extraocular movement are normal. No ptosis. CN V: Facial sensation is intact to light touch CN VII: Face is symmetric with normal eye closure  CN VIII: Hearing is normal to causal conversation. CN IX, X: Phonation is normal. CN XI: Head turning and shoulder shrug  are intact  MOTOR: There is no pronator drift of out-stretched arms. Muscle bulk and tone are normal. Muscle strength is normal.  REFLEXES: Reflexes are 2+ and symmetric at the biceps, triceps, knees, and  ankles. Plantar responses are flexor.  SENSORY: Intact to light touch, pinprick and vibratory sensation are intact in fingers and toes.  COORDINATION: There is no trunk or limb dysmetria noted.  GAIT/STANCE: She needs push up to get up from seated position, deliberate, cautious walking,  DIAGNOSTIC DATA (LABS, IMAGING, TESTING) - I reviewed patient records, labs, notes, testing and imaging myself where available.   ASSESSMENT AND PLAN  Seville BARBA REKOWSKI is a 75 y.o. female   Long history of paranoid schizophrenia Worsening functional status since change of medication in 2020,  Clonazepam 3 mg daily was stopped,  She now complains of worsening gait abnormality, urinary incontinence,  MRI of the brain is pending  Similar presentation in the past, extensive evaluation failed to demonstrate etiology, she does have variable effort on examination,  We will follow-up her MRI of the brain report,  Continue physical therapy, encourage moderate exercise  Optimal control of her psychiatric disease, which likely contributed to her current symptoms.  Total time spent reviewing the chart, obtaining history, examined patient, ordering tests, documentation, consultations and family, care coordination was 40 minutes     Marcial Pacas, M.D. Ph.D.  [facility] [address], [place], [place] [address] Ph: [phone] Fax: [phone]  CC:  Glenis Smoker, MD [place],  [place] [address]  Glenis Smoker, MD

## 2020-05-21 NOTE — Telephone Encounter (Addendum)
IMPRESSION: Moderate atrophy, with progression since 2019. Mild white matter changes are stable  No acute abnormality.  Please call patient's family, I reviewed MRI of the brain that was ordered by her primary care physician Dr. Lindell Noe, Anastasia Pall, MD,  There was no acute abnormality, there was mild progression of generalized atrophy,

## 2020-05-28 ENCOUNTER — Other Ambulatory Visit: Payer: Self-pay

## 2020-05-28 ENCOUNTER — Ambulatory Visit
Admission: RE | Admit: 2020-05-28 | Discharge: 2020-05-28 | Disposition: A | Discharge status: Home / Self Care | Payer: Medicare Other | Source: Ambulatory Visit | Attending: Family Medicine | Admitting: Family Medicine

## 2020-05-28 DIAGNOSIS — G319 Degenerative disease of nervous system, unspecified: Secondary | ICD-10-CM | POA: Diagnosis not present

## 2020-05-28 DIAGNOSIS — R296 Repeated falls: Secondary | ICD-10-CM

## 2020-05-28 DIAGNOSIS — R413 Other amnesia: Secondary | ICD-10-CM | POA: Diagnosis not present

## 2020-05-28 DIAGNOSIS — Z853 Personal history of malignant neoplasm of breast: Secondary | ICD-10-CM | POA: Diagnosis not present

## 2020-05-28 DIAGNOSIS — R260 Ataxic gait: Secondary | ICD-10-CM | POA: Diagnosis not present

## 2020-06-01 DIAGNOSIS — R260 Ataxic gait: Secondary | ICD-10-CM | POA: Diagnosis not present

## 2020-06-02 NOTE — Telephone Encounter (Signed)
I spoke to the patient's husband (on [place]) and he verbalized understanding of the MRI results. He will relay this information to the patient.

## 2020-06-04 DIAGNOSIS — R260 Ataxic gait: Secondary | ICD-10-CM | POA: Diagnosis not present

## 2020-06-08 DIAGNOSIS — R260 Ataxic gait: Secondary | ICD-10-CM | POA: Diagnosis not present

## 2020-06-11 DIAGNOSIS — R260 Ataxic gait: Secondary | ICD-10-CM | POA: Diagnosis not present

## 2020-06-16 DIAGNOSIS — R260 Ataxic gait: Secondary | ICD-10-CM | POA: Diagnosis not present

## 2020-06-18 DIAGNOSIS — R260 Ataxic gait: Secondary | ICD-10-CM | POA: Diagnosis not present

## 2020-06-22 DIAGNOSIS — R260 Ataxic gait: Secondary | ICD-10-CM | POA: Diagnosis not present

## 2020-06-25 DIAGNOSIS — R260 Ataxic gait: Secondary | ICD-10-CM | POA: Diagnosis not present

## 2020-06-29 DIAGNOSIS — R260 Ataxic gait: Secondary | ICD-10-CM | POA: Diagnosis not present

## 2020-07-02 DIAGNOSIS — R260 Ataxic gait: Secondary | ICD-10-CM | POA: Diagnosis not present

## 2020-07-06 DIAGNOSIS — R260 Ataxic gait: Secondary | ICD-10-CM | POA: Diagnosis not present

## 2020-07-08 DIAGNOSIS — R351 Nocturia: Secondary | ICD-10-CM | POA: Diagnosis not present

## 2020-07-08 DIAGNOSIS — R350 Frequency of micturition: Secondary | ICD-10-CM | POA: Diagnosis not present

## 2020-07-09 DIAGNOSIS — R260 Ataxic gait: Secondary | ICD-10-CM | POA: Diagnosis not present

## 2020-07-13 DIAGNOSIS — R260 Ataxic gait: Secondary | ICD-10-CM | POA: Diagnosis not present

## 2020-07-20 DIAGNOSIS — R260 Ataxic gait: Secondary | ICD-10-CM | POA: Diagnosis not present

## 2020-07-23 DIAGNOSIS — F339 Major depressive disorder, recurrent, unspecified: Secondary | ICD-10-CM | POA: Diagnosis not present

## 2020-07-23 DIAGNOSIS — F411 Generalized anxiety disorder: Secondary | ICD-10-CM | POA: Diagnosis not present

## 2020-07-23 DIAGNOSIS — F319 Bipolar disorder, unspecified: Secondary | ICD-10-CM | POA: Diagnosis not present

## 2020-07-27 DIAGNOSIS — R260 Ataxic gait: Secondary | ICD-10-CM | POA: Diagnosis not present

## 2020-07-30 DIAGNOSIS — R260 Ataxic gait: Secondary | ICD-10-CM | POA: Diagnosis not present

## 2020-08-03 DIAGNOSIS — R260 Ataxic gait: Secondary | ICD-10-CM | POA: Diagnosis not present

## 2020-08-06 DIAGNOSIS — R260 Ataxic gait: Secondary | ICD-10-CM | POA: Diagnosis not present

## 2020-08-10 DIAGNOSIS — R260 Ataxic gait: Secondary | ICD-10-CM | POA: Diagnosis not present

## 2020-08-11 DIAGNOSIS — R350 Frequency of micturition: Secondary | ICD-10-CM | POA: Diagnosis not present

## 2020-08-11 DIAGNOSIS — N3946 Mixed incontinence: Secondary | ICD-10-CM | POA: Diagnosis not present

## 2020-08-12 DIAGNOSIS — N958 Other specified menopausal and perimenopausal disorders: Secondary | ICD-10-CM | POA: Diagnosis not present

## 2020-08-13 DIAGNOSIS — Z23 Encounter for immunization: Secondary | ICD-10-CM | POA: Diagnosis not present

## 2020-08-13 DIAGNOSIS — R260 Ataxic gait: Secondary | ICD-10-CM | POA: Diagnosis not present

## 2020-08-17 DIAGNOSIS — R260 Ataxic gait: Secondary | ICD-10-CM | POA: Diagnosis not present

## 2020-08-19 DIAGNOSIS — N390 Urinary tract infection, site not specified: Secondary | ICD-10-CM | POA: Diagnosis not present

## 2020-08-24 DIAGNOSIS — I10 Essential (primary) hypertension: Secondary | ICD-10-CM | POA: Diagnosis not present

## 2020-08-24 DIAGNOSIS — E1165 Type 2 diabetes mellitus with hyperglycemia: Secondary | ICD-10-CM | POA: Diagnosis not present

## 2020-08-24 DIAGNOSIS — R413 Other amnesia: Secondary | ICD-10-CM | POA: Diagnosis not present

## 2020-08-24 DIAGNOSIS — F339 Major depressive disorder, recurrent, unspecified: Secondary | ICD-10-CM | POA: Diagnosis not present

## 2020-08-26 DIAGNOSIS — R260 Ataxic gait: Secondary | ICD-10-CM | POA: Diagnosis not present

## 2020-08-31 DIAGNOSIS — R260 Ataxic gait: Secondary | ICD-10-CM | POA: Diagnosis not present

## 2020-09-01 DIAGNOSIS — R7401 Elevation of levels of liver transaminase levels: Secondary | ICD-10-CM | POA: Diagnosis not present

## 2020-09-02 ENCOUNTER — Other Ambulatory Visit: Payer: Self-pay

## 2020-09-02 ENCOUNTER — Encounter: Payer: Self-pay | Admitting: Neurology

## 2020-09-02 ENCOUNTER — Ambulatory Visit (INDEPENDENT_AMBULATORY_CARE_PROVIDER_SITE_OTHER): Payer: Medicare Other | Admitting: Neurology

## 2020-09-02 VITALS — BP 131/73 | HR 75 | Ht 64.75 in | Wt 144.5 lb

## 2020-09-02 DIAGNOSIS — R269 Unspecified abnormalities of gait and mobility: Secondary | ICD-10-CM

## 2020-09-02 DIAGNOSIS — R413 Other amnesia: Secondary | ICD-10-CM | POA: Diagnosis not present

## 2020-09-02 NOTE — Progress Notes (Signed)
Chief Complaint  Patient presents with  . Follow-up    She is here with her husband, [name]. They would like to review her MRI brain results. She has continued going to PT twice weekly. She is using a rolling walker to assist with ambulation. Recent MMSE 28/30.     ASSESSMENT AND PLAN  [name] is a 75 y.o. female   Long history of paranoid schizophrenia Worsening functional status since change of medication in 2020,  She complains of worsening gait abnormality since her long-term clonazepam 3 mg daily was stopped  Her gait abnormality has much improved following physical therapy  MRI of the brain showed no significant change, evidence of generalized atrophy mild supratentorium small vessel disease  Similar presentation in the past, extensive evaluation failed to demonstrate etiology, she does have variable effort on examination,  Optimal control of her psychiatric disease, which likely contributed to her current symptoms.  Encouraged to continue physical therapy, moderate exercise  DIAGNOSTIC DATA (LABS, IMAGING, TESTING) - I reviewed patient records, labs, notes, testing and imaging myself where available.  MRI of brain wo on May 28 2020 Moderate atrophy, with progression since 2019. Mild white matter changes are stable  No acute abnormality.   PHYSICAL EXAM   Vitals:   09/02/20 0743  BP: 131/73  Pulse: 75  Weight: 144 lb 8 oz (65.5 kg)  Height: 5' 4.75" (1.645 m)   Not recorded     Body mass index is 24.23 kg/m.  PHYSICAL EXAMNIATION:  Gen: NAD, conversant, well nourised, well groomed         NEUROLOGICAL EXAM:  MENTAL STATUS: Speech/cognition: Following commands, childish talking, mumbling,,  MMSE - Mini Mental State Exam 05/21/2020  Orientation to time 4  Orientation to Place 5  Registration 3  Attention/ Calculation 5  Recall 2  Language- name 2 objects 2  Language- repeat 1  Language- follow 3 step command 3  Language- read & follow  direction 1  Write a sentence 1  Copy design 1  Total score 28  animal naming 8  CRANIAL NERVES: CN II: Visual fields are full to confrontation. Pupils are round equal and briskly reactive to light. CN III, IV, VI: extraocular movement are normal. No ptosis. CN V: Facial sensation is intact to light touch CN VII: Face is symmetric with normal eye closure  CN VIII: Hearing is normal to causal conversation. CN IX, X: Phonation is normal. CN XI: Head turning and shoulder shrug are intact  MOTOR: Normal strength, loss normal rhythm with repetitive movement, was noted to have variable effort on examination, there was no significant rigidity  REFLEXES: Reflexes are 2+ and symmetric at the biceps, triceps, knees, and ankles. Plantar responses are flexor.  SENSORY: Intact to light touch, pinprick and vibratory sensation are intact in fingers and toes.  COORDINATION: There is no trunk or limb dysmetria noted.  GAIT/STANCE: She needs push up to get up from seated position, deliberate, cautious walking,    HISTORICAL [name] is a 75 year old right-handed female, accompanied by her husband seen in refer by her primary care physician Dr. Sela Hilding for evaluation of worsening gait abnormality  I reviewed and summarized referring note, and previous history.  I saw her initially in January 2018, She had long history of paranoid schizophrenia, diagnosis was made in 1974, she has been treated with different antipsychotic medications, was on a stable dose of an Abilify 30 mg every day since 2002, per patient she was also  on clonazepam 1 mg in the morning, 2 mg every night for many years  She also had a past medical history of diabetes, hyperlipidemia, left breast cancer in 1995, status post lobectomy followed by radiation therapy, knee replacement  Following her most recent left knee replacement in 2017, she complains of continued gait abnormality, initially it was contributed  to her knee pain, but failed to improve after she recovered from knee replacement, during previous visit, she also complains of intermittent urinary incontinence, wearing pads,  On examination, she was noted to have variable effort, especially when evaluating her gait,  Electrodiagnostic study in 2018, which showed no significant abnormality, in specific, there is no evidence of large fiber peripheral neuropathy or bilateral lumbosacral radiculopathy.  Personally reviewed MRI of the cervical in February 2018, multilevel degenerative disc disease no significant canal or foraminal stenosis. MRI of the brain April 2019 showed mild generalized atrophy, scattered supratentorium small vessel disease.  She had some major changes in her psychiatric management since beginning of 2020, she changed to different psychiatrist, clonazepam 3 mg daily was stopped fairly abruptly, she was put on Depakote ER now taking 500 mg every night, continued Abilify 30 mg daily  Concurrent with her change of medication and providers, her husband noticed significant change in patient's functional status, she tends to talk to herself, mumbling, carry on a conversation all day long, she denies visual auditory hallucination, but has increased gait abnormality, worsening incontinence, for a while, she uses multiple depends in the day, now has some improvement,  She denies significant pain, has childish talking during the interview, MRI is pending end of January 2021  She has mild improvement in her bladder issues since starting Myrbetriq few months ago, but she tends to center her activity around using bathroom, stay in bathroom for extended period of time, she is so afraid of falling, become less mobile and functional at home, has quit driving since change of medication, physical therapy provides some help, but she was not able to keep up with exercise routine  Laboratory evaluations:2020, normal CBC, hemoglobin of 12.1, normal  BMP  UPDATE September 02 2020: When she was treated for UTI around April 18th 2022, she had one episode of weakness, could not getup from bathroom seat, had major medication changes since then, now her gait difficulty has much improved, physical therapy has helped.   Personally reviewed MRI of the brain in January 2022, generalized atrophy, mild supratentorium small vessel disease, no significant change compared to previous MRI in April 2019  REVIEW OF SYSTEMS: Full 14 system review of systems performed and notable only for as above All other review of systems were negative.  ALLERGIES: Allergies  Allergen Reactions  . Ciprofloxacin Hcl Rash  . Latex Rash  . Penicillins Rash    Has patient had a PCN reaction causing immediate rash, facial/tongue/throat swelling, SOB or lightheadedness with hypotension: Yes Has patient had a PCN reaction causing severe rash involving mucus membranes or skin necrosis: No Has patient had a PCN reaction that required hospitalization No Has patient had a PCN reaction occurring within the last 10 years: Yes If all of the above answers are "NO", then may proceed with Cephalosporin use.  . Sulfa Antibiotics Rash    HOME MEDICATIONS: Current Outpatient Medications  Medication Sig Dispense Refill  . acetaminophen (TYLENOL) 500 MG tablet Take 500 mg by mouth every 6 (six) hours as needed.    . ARIPiprazole (ABILIFY) 30 MG tablet Take 30 mg by mouth at  bedtime.     . Calcium-Phosphorus-Vitamin D (CITRACAL +D3 PO) Take 2 tablets by mouth daily.    . Cyanocobalamin (B-12) 1000 MCG TABS Take 1 tablet by mouth once a week.    . divalproex (DEPAKOTE ER) 500 MG 24 hr tablet Take 1 tablet by mouth daily.    Marland Kitchen ibuprofen (ADVIL) 600 MG tablet Take 1 tablet (600 mg total) by mouth every 6 (six) hours as needed for mild pain. 30 tablet 0  . insulin lispro protamine-lispro (HUMALOG 75/25 MIX) (75-25) 100 UNIT/ML SUSP injection Inject 8 Units into the skin 2 (two) times daily.  Sliding scale depending on time of day and what eating.    . metFORMIN (GLUCOPHAGE-XR) 500 MG 24 hr tablet Take 1,000 mg by mouth 2 (two) times daily.   5  . mirabegron ER (MYRBETRIQ) 50 MG TB24 tablet Take 50 mg by mouth daily.    . Misc Natural Products (GLUCOS-CHONDROIT-MSM COMPLEX PO) Take 2 tablets by mouth daily.    . Multiple Vitamins-Minerals (VITEYES AREDS ADVANCED) CAPS Take by mouth daily.    . sitaGLIPtin (JANUVIA) 100 MG tablet Take 100 mg by mouth daily with breakfast.      No current facility-administered medications for this visit.    PAST MEDICAL HISTORY: Past Medical History:  Diagnosis Date  . Anxiety   . Bipolar 1 disorder ([place])    followed by leslie o'neal NP  . DDD (degenerative disc disease), cervical   . Depression   . Diabetic retinopathy ([place])    mild right eye per pt  . History of asthma    childhood  . History of duodenal ulcer 1989   remote  . History of external beam radiation therapy    1995  left breast  . History of left breast cancer    1995  s/p  breast lumpectomy and radiation therapy  . Hyperlipidemia   . Hypertension   . Memory loss   . OA (osteoarthritis)    hands  . PMB (postmenopausal bleeding)   . Type 2 diabetes mellitus treated with insulin [facility])    endocrinologist-- dr Chalmers Cater--- fasting sugar 140 (7 day average),  checks 4-5 times daily  . Urge urinary incontinence   . Wears glasses     PAST SURGICAL HISTORY: Past Surgical History:  Procedure Laterality Date  . BREAST LUMPECTOMY  1995   left breast / radiation  . DILATION AND CURETTAGE OF UTERUS  1989  . HYSTEROSCOPY WITH D & C N/A 02/06/2019   Procedure: DILATATION AND CURETTAGE /HYSTEROSCOPY;  Surgeon: [name], MD;  Location: [facility];  Service: Gynecology;  Laterality: N/A;  . KNEE ARTHROSCOPY Right 2015  . TONSILLECTOMY  child  . TOTAL KNEE ARTHROPLASTY Right 07/07/2014   Procedure: RIGHT TOTAL KNEE ARTHROPLASTY;  Surgeon: [name], MD;   Location: [facility];  Service: Orthopedics;  Laterality: Right;  . TOTAL KNEE ARTHROPLASTY Left 07/06/2015   Procedure: LEFT TOTAL KNEE ARTHROPLASTY;  Surgeon: Gaynelle Arabian, MD;  Location: [facility];  Service: Orthopedics;  Laterality: Left;    FAMILY HISTORY: Family History  Problem Relation Age of Onset  . Schizophrenia Mother   . Prostate cancer Father   . Aneurysm Father        stomach  . Diabetes Father   . Colitis Paternal Grandmother     SOCIAL HISTORY: Social History   Socioeconomic History  . Marital status: Married    Spouse name: Not on file  . Number of children: 1  .  Years of education: Associates  . Highest education level: Not on file  Occupational History  . Occupation: Retired  Tobacco Use  . Smoking status: Never Smoker  . Smokeless tobacco: Never Used  Vaping Use  . Vaping Use: Never used  Substance and Sexual Activity  . Alcohol use: Yes    Comment: occasional  . Drug use: Never  . Sexual activity: Not on file  Other Topics Concern  . Not on file  Social History Narrative   Lives at home with husband.   Right-handed.   3-4 cups caffeine.   Social Determinants of Health   Financial Resource Strain: Not on file  Food Insecurity: Not on file  Transportation Needs: Not on file  Physical Activity: Not on file  Stress: Not on file  Social Connections: Not on file  Intimate Partner Violence: Not on file      Marcial Pacas, M.D. Ph.D.  [facility] [address] [place], [place] [address] Ph: [phone] Fax: [phone]  CC:  Glenis Smoker, MD [place],  [place] [address]  Glenis Smoker, MD

## 2020-09-03 DIAGNOSIS — R260 Ataxic gait: Secondary | ICD-10-CM | POA: Diagnosis not present

## 2020-09-07 DIAGNOSIS — R260 Ataxic gait: Secondary | ICD-10-CM | POA: Diagnosis not present

## 2020-09-08 DIAGNOSIS — Z961 Presence of intraocular lens: Secondary | ICD-10-CM | POA: Diagnosis not present

## 2020-09-08 DIAGNOSIS — E119 Type 2 diabetes mellitus without complications: Secondary | ICD-10-CM | POA: Diagnosis not present

## 2020-09-08 DIAGNOSIS — H52203 Unspecified astigmatism, bilateral: Secondary | ICD-10-CM | POA: Diagnosis not present

## 2020-09-09 DIAGNOSIS — E7801 Familial hypercholesterolemia: Secondary | ICD-10-CM | POA: Diagnosis not present

## 2020-09-09 DIAGNOSIS — E1165 Type 2 diabetes mellitus with hyperglycemia: Secondary | ICD-10-CM | POA: Diagnosis not present

## 2020-09-10 DIAGNOSIS — R260 Ataxic gait: Secondary | ICD-10-CM | POA: Diagnosis not present

## 2020-09-14 DIAGNOSIS — R260 Ataxic gait: Secondary | ICD-10-CM | POA: Diagnosis not present

## 2020-09-16 DIAGNOSIS — E1140 Type 2 diabetes mellitus with diabetic neuropathy, unspecified: Secondary | ICD-10-CM | POA: Diagnosis not present

## 2020-09-16 DIAGNOSIS — I10 Essential (primary) hypertension: Secondary | ICD-10-CM | POA: Diagnosis not present

## 2020-09-16 DIAGNOSIS — E1165 Type 2 diabetes mellitus with hyperglycemia: Secondary | ICD-10-CM | POA: Diagnosis not present

## 2020-09-16 DIAGNOSIS — M21371 Foot drop, right foot: Secondary | ICD-10-CM | POA: Diagnosis not present

## 2020-09-16 DIAGNOSIS — Z794 Long term (current) use of insulin: Secondary | ICD-10-CM | POA: Diagnosis not present

## 2020-09-16 DIAGNOSIS — R809 Proteinuria, unspecified: Secondary | ICD-10-CM | POA: Diagnosis not present

## 2020-09-16 DIAGNOSIS — E7800 Pure hypercholesterolemia, unspecified: Secondary | ICD-10-CM | POA: Diagnosis not present

## 2020-09-17 DIAGNOSIS — R260 Ataxic gait: Secondary | ICD-10-CM | POA: Diagnosis not present

## 2020-09-21 DIAGNOSIS — R260 Ataxic gait: Secondary | ICD-10-CM | POA: Diagnosis not present

## 2020-09-22 DIAGNOSIS — E1165 Type 2 diabetes mellitus with hyperglycemia: Secondary | ICD-10-CM | POA: Diagnosis not present

## 2020-09-22 DIAGNOSIS — R413 Other amnesia: Secondary | ICD-10-CM | POA: Diagnosis not present

## 2020-09-22 DIAGNOSIS — E782 Mixed hyperlipidemia: Secondary | ICD-10-CM | POA: Diagnosis not present

## 2020-09-22 DIAGNOSIS — F339 Major depressive disorder, recurrent, unspecified: Secondary | ICD-10-CM | POA: Diagnosis not present

## 2020-09-23 DIAGNOSIS — F319 Bipolar disorder, unspecified: Secondary | ICD-10-CM | POA: Diagnosis not present

## 2020-09-23 DIAGNOSIS — F411 Generalized anxiety disorder: Secondary | ICD-10-CM | POA: Diagnosis not present

## 2020-09-23 DIAGNOSIS — F339 Major depressive disorder, recurrent, unspecified: Secondary | ICD-10-CM | POA: Diagnosis not present

## 2020-09-24 DIAGNOSIS — R260 Ataxic gait: Secondary | ICD-10-CM | POA: Diagnosis not present

## 2020-09-29 DIAGNOSIS — R260 Ataxic gait: Secondary | ICD-10-CM | POA: Diagnosis not present

## 2020-10-01 DIAGNOSIS — R350 Frequency of micturition: Secondary | ICD-10-CM | POA: Diagnosis not present

## 2020-10-01 DIAGNOSIS — R351 Nocturia: Secondary | ICD-10-CM | POA: Diagnosis not present

## 2020-10-01 DIAGNOSIS — N3946 Mixed incontinence: Secondary | ICD-10-CM | POA: Diagnosis not present

## 2020-10-01 DIAGNOSIS — R260 Ataxic gait: Secondary | ICD-10-CM | POA: Diagnosis not present

## 2020-10-07 DIAGNOSIS — R260 Ataxic gait: Secondary | ICD-10-CM | POA: Diagnosis not present

## 2020-10-09 DIAGNOSIS — R260 Ataxic gait: Secondary | ICD-10-CM | POA: Diagnosis not present

## 2020-10-12 DIAGNOSIS — R260 Ataxic gait: Secondary | ICD-10-CM | POA: Diagnosis not present

## 2020-10-14 DIAGNOSIS — R260 Ataxic gait: Secondary | ICD-10-CM | POA: Diagnosis not present

## 2020-10-19 DIAGNOSIS — R260 Ataxic gait: Secondary | ICD-10-CM | POA: Diagnosis not present

## 2020-10-22 DIAGNOSIS — R260 Ataxic gait: Secondary | ICD-10-CM | POA: Diagnosis not present

## 2020-10-26 DIAGNOSIS — R260 Ataxic gait: Secondary | ICD-10-CM | POA: Diagnosis not present

## 2020-10-29 DIAGNOSIS — R260 Ataxic gait: Secondary | ICD-10-CM | POA: Diagnosis not present

## 2020-11-02 DIAGNOSIS — R260 Ataxic gait: Secondary | ICD-10-CM | POA: Diagnosis not present

## 2020-11-05 DIAGNOSIS — R260 Ataxic gait: Secondary | ICD-10-CM | POA: Diagnosis not present

## 2020-11-11 DIAGNOSIS — F411 Generalized anxiety disorder: Secondary | ICD-10-CM | POA: Diagnosis not present

## 2020-11-11 DIAGNOSIS — F339 Major depressive disorder, recurrent, unspecified: Secondary | ICD-10-CM | POA: Diagnosis not present

## 2020-11-11 DIAGNOSIS — F319 Bipolar disorder, unspecified: Secondary | ICD-10-CM | POA: Diagnosis not present

## 2020-11-12 DIAGNOSIS — R260 Ataxic gait: Secondary | ICD-10-CM | POA: Diagnosis not present

## 2020-11-16 DIAGNOSIS — R260 Ataxic gait: Secondary | ICD-10-CM | POA: Diagnosis not present

## 2020-11-19 DIAGNOSIS — R260 Ataxic gait: Secondary | ICD-10-CM | POA: Diagnosis not present

## 2020-11-23 DIAGNOSIS — R260 Ataxic gait: Secondary | ICD-10-CM | POA: Diagnosis not present

## 2020-11-26 DIAGNOSIS — R260 Ataxic gait: Secondary | ICD-10-CM | POA: Diagnosis not present

## 2020-11-30 DIAGNOSIS — R260 Ataxic gait: Secondary | ICD-10-CM | POA: Diagnosis not present

## 2020-12-03 DIAGNOSIS — R260 Ataxic gait: Secondary | ICD-10-CM | POA: Diagnosis not present

## 2020-12-10 DIAGNOSIS — R350 Frequency of micturition: Secondary | ICD-10-CM | POA: Diagnosis not present

## 2020-12-10 DIAGNOSIS — N3946 Mixed incontinence: Secondary | ICD-10-CM | POA: Diagnosis not present

## 2020-12-10 DIAGNOSIS — R260 Ataxic gait: Secondary | ICD-10-CM | POA: Diagnosis not present

## 2020-12-14 DIAGNOSIS — R260 Ataxic gait: Secondary | ICD-10-CM | POA: Diagnosis not present

## 2020-12-17 DIAGNOSIS — R350 Frequency of micturition: Secondary | ICD-10-CM | POA: Diagnosis not present

## 2020-12-24 DIAGNOSIS — R350 Frequency of micturition: Secondary | ICD-10-CM | POA: Diagnosis not present

## 2020-12-24 DIAGNOSIS — R260 Ataxic gait: Secondary | ICD-10-CM | POA: Diagnosis not present

## 2020-12-28 DIAGNOSIS — R260 Ataxic gait: Secondary | ICD-10-CM | POA: Diagnosis not present

## 2020-12-31 DIAGNOSIS — R350 Frequency of micturition: Secondary | ICD-10-CM | POA: Diagnosis not present

## 2021-01-04 DIAGNOSIS — R260 Ataxic gait: Secondary | ICD-10-CM | POA: Diagnosis not present

## 2021-01-06 DIAGNOSIS — F319 Bipolar disorder, unspecified: Secondary | ICD-10-CM | POA: Diagnosis not present

## 2021-01-06 DIAGNOSIS — F339 Major depressive disorder, recurrent, unspecified: Secondary | ICD-10-CM | POA: Diagnosis not present

## 2021-01-06 DIAGNOSIS — F411 Generalized anxiety disorder: Secondary | ICD-10-CM | POA: Diagnosis not present

## 2021-01-07 DIAGNOSIS — R350 Frequency of micturition: Secondary | ICD-10-CM | POA: Diagnosis not present

## 2021-01-07 DIAGNOSIS — R260 Ataxic gait: Secondary | ICD-10-CM | POA: Diagnosis not present

## 2021-01-12 DIAGNOSIS — R260 Ataxic gait: Secondary | ICD-10-CM | POA: Diagnosis not present

## 2021-01-14 DIAGNOSIS — R350 Frequency of micturition: Secondary | ICD-10-CM | POA: Diagnosis not present

## 2021-01-14 DIAGNOSIS — R260 Ataxic gait: Secondary | ICD-10-CM | POA: Diagnosis not present

## 2021-01-18 DIAGNOSIS — R260 Ataxic gait: Secondary | ICD-10-CM | POA: Diagnosis not present

## 2021-01-21 DIAGNOSIS — R350 Frequency of micturition: Secondary | ICD-10-CM | POA: Diagnosis not present

## 2021-01-21 DIAGNOSIS — R260 Ataxic gait: Secondary | ICD-10-CM | POA: Diagnosis not present

## 2021-01-25 DIAGNOSIS — R260 Ataxic gait: Secondary | ICD-10-CM | POA: Diagnosis not present

## 2021-01-28 DIAGNOSIS — N3946 Mixed incontinence: Secondary | ICD-10-CM | POA: Diagnosis not present

## 2021-01-28 DIAGNOSIS — R260 Ataxic gait: Secondary | ICD-10-CM | POA: Diagnosis not present

## 2021-02-01 DIAGNOSIS — R260 Ataxic gait: Secondary | ICD-10-CM | POA: Diagnosis not present

## 2021-02-04 DIAGNOSIS — R350 Frequency of micturition: Secondary | ICD-10-CM | POA: Diagnosis not present

## 2021-02-04 DIAGNOSIS — R260 Ataxic gait: Secondary | ICD-10-CM | POA: Diagnosis not present

## 2021-02-10 DIAGNOSIS — R260 Ataxic gait: Secondary | ICD-10-CM | POA: Diagnosis not present

## 2021-02-11 DIAGNOSIS — R350 Frequency of micturition: Secondary | ICD-10-CM | POA: Diagnosis not present

## 2021-02-18 DIAGNOSIS — R260 Ataxic gait: Secondary | ICD-10-CM | POA: Diagnosis not present

## 2021-02-18 DIAGNOSIS — R350 Frequency of micturition: Secondary | ICD-10-CM | POA: Diagnosis not present

## 2021-02-23 DIAGNOSIS — R260 Ataxic gait: Secondary | ICD-10-CM | POA: Diagnosis not present

## 2021-02-25 DIAGNOSIS — R260 Ataxic gait: Secondary | ICD-10-CM | POA: Diagnosis not present

## 2021-02-25 DIAGNOSIS — N3946 Mixed incontinence: Secondary | ICD-10-CM | POA: Diagnosis not present

## 2021-02-25 DIAGNOSIS — R350 Frequency of micturition: Secondary | ICD-10-CM | POA: Diagnosis not present

## 2021-03-01 ENCOUNTER — Emergency Department (HOSPITAL_BASED_OUTPATIENT_CLINIC_OR_DEPARTMENT_OTHER)
Admission: EM | Admit: 2021-03-01 | Discharge: 2021-03-01 | Disposition: A | Discharge status: Home / Self Care | Payer: Medicare Other | Attending: Emergency Medicine | Admitting: Emergency Medicine

## 2021-03-01 ENCOUNTER — Emergency Department (HOSPITAL_BASED_OUTPATIENT_CLINIC_OR_DEPARTMENT_OTHER): Payer: Medicare Other

## 2021-03-01 ENCOUNTER — Encounter (HOSPITAL_BASED_OUTPATIENT_CLINIC_OR_DEPARTMENT_OTHER): Payer: Self-pay

## 2021-03-01 ENCOUNTER — Other Ambulatory Visit: Payer: Self-pay

## 2021-03-01 ENCOUNTER — Emergency Department (HOSPITAL_BASED_OUTPATIENT_CLINIC_OR_DEPARTMENT_OTHER): Payer: Medicare Other | Admitting: Radiology

## 2021-03-01 DIAGNOSIS — S0003XA Contusion of scalp, initial encounter: Secondary | ICD-10-CM | POA: Diagnosis not present

## 2021-03-01 DIAGNOSIS — J45909 Unspecified asthma, uncomplicated: Secondary | ICD-10-CM | POA: Insufficient documentation

## 2021-03-01 DIAGNOSIS — Y92009 Unspecified place in unspecified non-institutional (private) residence as the place of occurrence of the external cause: Secondary | ICD-10-CM | POA: Insufficient documentation

## 2021-03-01 DIAGNOSIS — S0081XA Abrasion of other part of head, initial encounter: Secondary | ICD-10-CM | POA: Insufficient documentation

## 2021-03-01 DIAGNOSIS — E119 Type 2 diabetes mellitus without complications: Secondary | ICD-10-CM | POA: Diagnosis not present

## 2021-03-01 DIAGNOSIS — W19XXXA Unspecified fall, initial encounter: Secondary | ICD-10-CM

## 2021-03-01 DIAGNOSIS — Z91040 Latex allergy status: Secondary | ICD-10-CM | POA: Insufficient documentation

## 2021-03-01 DIAGNOSIS — Z794 Long term (current) use of insulin: Secondary | ICD-10-CM | POA: Diagnosis not present

## 2021-03-01 DIAGNOSIS — S4992XA Unspecified injury of left shoulder and upper arm, initial encounter: Secondary | ICD-10-CM | POA: Insufficient documentation

## 2021-03-01 DIAGNOSIS — I10 Essential (primary) hypertension: Secondary | ICD-10-CM | POA: Diagnosis not present

## 2021-03-01 DIAGNOSIS — Z043 Encounter for examination and observation following other accident: Secondary | ICD-10-CM | POA: Diagnosis not present

## 2021-03-01 DIAGNOSIS — S20212A Contusion of left front wall of thorax, initial encounter: Secondary | ICD-10-CM | POA: Diagnosis not present

## 2021-03-01 DIAGNOSIS — Z96653 Presence of artificial knee joint, bilateral: Secondary | ICD-10-CM | POA: Diagnosis not present

## 2021-03-01 DIAGNOSIS — M25512 Pain in left shoulder: Secondary | ICD-10-CM

## 2021-03-01 DIAGNOSIS — Z853 Personal history of malignant neoplasm of breast: Secondary | ICD-10-CM | POA: Diagnosis not present

## 2021-03-01 DIAGNOSIS — S0090XA Unspecified superficial injury of unspecified part of head, initial encounter: Secondary | ICD-10-CM | POA: Diagnosis not present

## 2021-03-01 DIAGNOSIS — Z7984 Long term (current) use of oral hypoglycemic drugs: Secondary | ICD-10-CM | POA: Insufficient documentation

## 2021-03-01 DIAGNOSIS — S299XXA Unspecified injury of thorax, initial encounter: Secondary | ICD-10-CM | POA: Diagnosis present

## 2021-03-01 DIAGNOSIS — R220 Localized swelling, mass and lump, head: Secondary | ICD-10-CM | POA: Diagnosis not present

## 2021-03-01 LAB — CBG MONITORING, ED: Glucose-Capillary: 227 mg/dL — ABNORMAL HIGH (ref 70–99)

## 2021-03-01 NOTE — ED Notes (Signed)
ED Provider at bedside. 

## 2021-03-01 NOTE — ED Triage Notes (Signed)
Pt had fall on Sunday in driveway landed on left side hit head and left shoulder. Denies LOC. C/o left shoulder pain and eval of head injury per PCP.

## 2021-03-01 NOTE — ED Provider Notes (Signed)
[place] EMERGENCY DEPT Provider Note   CSN: [id number] Arrival date & time: 03/01/21  1615     History Chief Complaint  Patient presents with   [name] is a 75 y.o. female.  Patient has a history of chronic balance issues. Patient presents after a fall at home yesterday afternoon.  She was getting out of a car where she lost her balance, fell backwards and hit her head on the steering well.  Patient states that she then hit her chin on on her left chest.  Since then, she has a large hematoma on her left posterior scalp.  She has had left shoulder pain and has had difficulty moving her left shoulder.  She has substantial bruising on her left chest.  She denies any blood thinners.  She denies any numbness or tingling in her upper extremities.  Denies any altered mental status, speech changes, vision changes, weakness or numbness of extremities, worsening balance issues.  Fall Associated symptoms include headaches. Pertinent negatives include no chest pain, no abdominal pain and no shortness of breath.      Past Medical History:  Diagnosis Date   Anxiety    Bipolar 1 disorder ([place])    followed by Debbora Dus NP   DDD (degenerative disc disease), cervical    Depression    Diabetic retinopathy ([place])    mild right eye per pt   History of asthma    childhood   History of duodenal ulcer 1989   remote   History of external beam radiation therapy    1995  left breast   History of left breast cancer    1995  s/p  breast lumpectomy and radiation therapy   Hyperlipidemia    Hypertension    Memory loss    OA (osteoarthritis)    hands   PMB (postmenopausal bleeding)    Type 2 diabetes mellitus treated with insulin [facility])    endocrinologist-- dr Chalmers Cater--- fasting sugar 140 (7 day average),  checks 4-5 times daily   Urge urinary incontinence    Wears glasses     Patient Active Problem List   Diagnosis Date Noted   Torn ear lobe, right, initial  encounter 02/14/2018   Gait abnormality 08/15/2017   Memory loss 08/15/2017   Chronic low back pain 07/01/2016   Abnormality of gait 05/12/2016   Paresthesia 05/12/2016   OA (osteoarthritis) of knee 07/07/2014    Past Surgical History:  Procedure Laterality Date   BREAST LUMPECTOMY  1995   left breast / radiation   DILATION AND CURETTAGE OF UTERUS  1989   HYSTEROSCOPY WITH D & C N/A 02/06/2019   Procedure: DILATATION AND CURETTAGE /HYSTEROSCOPY;  Surgeon: [name], MD;  Location: [place];  Service: Gynecology;  Laterality: N/A;   KNEE ARTHROSCOPY Right 2015   TONSILLECTOMY  child   TOTAL KNEE ARTHROPLASTY Right 07/07/2014   Procedure: RIGHT TOTAL KNEE ARTHROPLASTY;  Surgeon: [name], MD;  Location: [facility];  Service: Orthopedics;  Laterality: Right;   TOTAL KNEE ARTHROPLASTY Left 07/06/2015   Procedure: LEFT TOTAL KNEE ARTHROPLASTY;  Surgeon: Gaynelle Arabian, MD;  Location: [facility];  Service: Orthopedics;  Laterality: Left;     OB History   No obstetric history on file.     Family History  Problem Relation Age of Onset   Schizophrenia Mother    Prostate cancer Father    Aneurysm Father        stomach  Diabetes Father    Colitis Paternal Grandmother     Social History   Tobacco Use   Smoking status: Never    Passive exposure: Never   Smokeless tobacco: Never  Vaping Use   Vaping Use: Never used  Substance Use Topics   Alcohol use: Yes    Comment: occasional   Drug use: Never    Home Medications Prior to Admission medications   Medication Sig Start Date End Date Taking? Authorizing Provider  acetaminophen (TYLENOL) 500 MG tablet Take 500 mg by mouth every 6 (six) hours as needed.    [provider]  ARIPiprazole (ABILIFY) 30 MG tablet Take 30 mg by mouth at bedtime.     [provider]  Calcium-Phosphorus-Vitamin D (CITRACAL +D3 PO) Take 2 tablets by mouth daily.    [provider]  Cyanocobalamin (B-12)  1000 MCG TABS Take 1 tablet by mouth once a week.    [provider]  divalproex (DEPAKOTE ER) 500 MG 24 hr tablet Take 1 tablet by mouth daily. 04/06/20   [provider]  ibuprofen (ADVIL) 600 MG tablet Take 1 tablet (600 mg total) by mouth every 6 (six) hours as needed for mild pain. 02/06/19   [name], MD  insulin lispro protamine-lispro (HUMALOG 75/25 MIX) (75-25) 100 UNIT/ML SUSP injection Inject 8 Units into the skin 2 (two) times daily. Sliding scale depending on time of day and what eating.    [provider]  metFORMIN (GLUCOPHAGE-XR) 500 MG 24 hr tablet Take 1,000 mg by mouth 2 (two) times daily.  04/24/15   [provider]  mirabegron ER (MYRBETRIQ) 50 MG TB24 tablet Take 50 mg by mouth daily.    [provider]  Misc Natural Products (GLUCOS-CHONDROIT-MSM COMPLEX PO) Take 2 tablets by mouth daily.    [provider]  Multiple Vitamins-Minerals (VITEYES AREDS ADVANCED) CAPS Take by mouth daily.    [provider]  sitaGLIPtin (JANUVIA) 100 MG tablet Take 100 mg by mouth daily with breakfast.     [provider]    Allergies    Ciprofloxacin hcl, Latex, Penicillins, and Sulfa antibiotics  Review of Systems   Review of Systems  Constitutional:  Negative for chills and fever.  HENT:  Negative for ear pain and sore throat.   Eyes:  Negative for pain and visual disturbance.  Respiratory:  Negative for cough and shortness of breath.   Cardiovascular:  Negative for chest pain and palpitations.  Gastrointestinal:  Negative for abdominal pain and vomiting.  Genitourinary:  Negative for dysuria and hematuria.  Musculoskeletal:  Positive for arthralgias. Negative for back pain.  Skin:  Positive for wound. Negative for color change and rash.  Neurological:  Positive for headaches. Negative for seizures and syncope.  All other systems reviewed and are negative.  Physical Exam Updated Vital Signs BP 135/75 (BP  Location: Right Arm)   Pulse 78   Temp 98.7 F (37.1 C) (Oral)   Resp 18   Ht 5' 4.75" (1.645 m)   Wt 62.1 kg   SpO2 95%   BMI 22.97 kg/m   Physical Exam Vitals and nursing note reviewed.  Constitutional:      General: She is not in acute distress.    Appearance: Normal appearance. She is well-developed. She is not ill-appearing, toxic-appearing or diaphoretic.  HENT:     Head: Normocephalic and atraumatic.     Comments: Small abrasion approximately 1 cm long to posterior left forehead with some surrounding swelling.  Laceration with no bleeding.  Well approximated.    Nose: No nasal deformity.     Mouth/Throat:     Lips: Pink. No lesions.  Eyes:     General: Gaze aligned appropriately. No scleral icterus.       Right eye: No discharge.        Left eye: No discharge.     Extraocular Movements: Extraocular movements intact.     Conjunctiva/sclera: Conjunctivae normal.     Right eye: Right conjunctiva is not injected. No exudate or hemorrhage.    Left eye: Left conjunctiva is not injected. No exudate or hemorrhage.    Pupils: Pupils are equal, round, and reactive to light.  Cardiovascular:     Pulses:          Radial pulses are 2+ on the right side and 2+ on the left side.       Posterior tibial pulses are 2+ on the right side and 2+ on the left side.     Comments: 2+ radial pulses bilaterally. Pulmonary:     Effort: Pulmonary effort is normal. No respiratory distress.  Chest:     Chest wall: Tenderness present.     Comments: Large area of ecchymosis over anterior left chest.  Tenderness to palpation over this area.   Musculoskeletal:     Right lower leg: No edema.     Left lower leg: No edema.     Comments: Tenderness to palpation of left clavicle, left shoulder, left humerus.  Limited range of motion with abduction of shoulder.  Not reaching above her head.  No midline cervical tenderness to palpation.  Skin:    General: Skin is warm and dry.     Findings: Ecchymosis  present.  Neurological:     Mental Status: She is alert and oriented to person, place, and time.  Psychiatric:        Mood and Affect: Mood normal.        Speech: Speech normal.        Behavior: Behavior normal. Behavior is cooperative.    ED Results / Procedures / Treatments   Labs (all labs ordered are listed, but only abnormal results are displayed) Labs Reviewed  CBG MONITORING, ED - Abnormal; Notable for the following components:      Result Value   Glucose-Capillary 227 (*)    All other components within normal limits    EKG None  Radiology DG Chest 2 View  Result Date: 03/01/2021 CLINICAL DATA:  Fall EXAM: CHEST - 2 VIEW COMPARISON:  None. FINDINGS: The heart size and mediastinal contours are within normal limits. Both lungs are clear. The visualized skeletal structures are unremarkable. IMPRESSION: No active cardiopulmonary disease. Electronically Signed   By: Donavan Foil M.D.   On: 03/01/2021 19:59   CT Head Wo Contrast  Result Date: 03/01/2021 CLINICAL DATA:  Head trauma. EXAM: CT HEAD WITHOUT CONTRAST TECHNIQUE: Contiguous axial images were obtained from the base of the skull through the vertex without intravenous contrast. COMPARISON:  MRI brain 05/28/2020. FINDINGS: Brain: No evidence of acute infarction, hemorrhage, hydrocephalus, extra-axial collection or mass lesion/mass effect. There is mild diffuse atrophy. There is mild periventricular white matter hypodensity, likely chronic small vessel ischemic change. This is similar to the prior MRI he Vascular: No hyperdense vessel or unexpected calcification. Skull: Normal. Negative for fracture or focal lesion. Sinuses/Orbits: No acute finding. Other: There is left parietal scalp soft tissue swelling. IMPRESSION: No acute intracranial abnormality. Electronically Signed   By: Warren Lacy  Dagoberto Reef M.D.   On: 03/01/2021 19:43   DG Shoulder Left  Result Date: 03/01/2021 CLINICAL DATA:  Fall, injury EXAM: LEFT SHOULDER - 2+ VIEW  COMPARISON:  None. FINDINGS: Mild AC joint degenerative change.  No fracture or malalignment. IMPRESSION: No acute osseous abnormality Electronically Signed   By: Donavan Foil M.D.   On: 03/01/2021 19:58    Procedures Procedures   Medications Ordered in ED Medications - No data to display  ED Course  I have reviewed the triage vital signs and the nursing notes.  Pertinent labs & imaging results that were available during my care of the patient were reviewed by me and considered in my medical decision making (see chart for details).    MDM Rules/Calculators/A&P                           Patient presents 1 day after a mechanical fall where she tripped and hit her posterior head as well as injured her left shoulder.  She has had limited range of motion of her left shoulder since the accident. On arrival, vitals are stable. CT head was negative for intracranial abnormality. X-ray of left shoulder no evidence of fracture or dislocation Chest x-ray with no evidence of rib fractures.  On my exam, patient with limited range of motion to left shoulder.  No step-offs of left clavicle.  No cervical tenderness to palpation.  Normal sensation distal extremities.  Normal pulses in distal extremities.  Given that imaging with negative reading, have low suspicion for fractures at this time.  Patient may have a soft tissue injury of her left shoulder.  Recommend following up with an orthopedic doctor for further evaluation of this.  Discussed these results with patient and husband.  Stable for discharge  Final Clinical Impression(s) / ED Diagnoses Final diagnoses:  Fall, initial encounter  Acute pain of left shoulder    Rx / DC Orders ED Discharge Orders     None        Adolphus Birchwood, PA-C 03/01/21 2038    Wyvonnia Dusky, MD 03/02/21 1029

## 2021-03-01 NOTE — ED Notes (Signed)
This RN presented the AVS utilizing Teachback Method. Patient verbalizes understanding of Discharge Instructions. Opportunity for Questioning and Answers were provided. Patient Discharged from ED in Wheelchair to Home with Family.

## 2021-03-01 NOTE — Discharge Instructions (Signed)
Please call and schedule an appointment with orthopedic doctor.  You can use the [facility] that I provided in your instructions or another orthopedic provider that you already established with.  Recommend treating symptoms with heat, stretching, Tylenol, minimal use of anti-inflammatories such as ibuprofen.

## 2021-03-03 DIAGNOSIS — M25512 Pain in left shoulder: Secondary | ICD-10-CM | POA: Diagnosis not present

## 2021-03-08 DIAGNOSIS — R260 Ataxic gait: Secondary | ICD-10-CM | POA: Diagnosis not present

## 2021-03-11 DIAGNOSIS — R260 Ataxic gait: Secondary | ICD-10-CM | POA: Diagnosis not present

## 2021-03-12 DIAGNOSIS — Z23 Encounter for immunization: Secondary | ICD-10-CM | POA: Diagnosis not present

## 2021-03-15 DIAGNOSIS — R260 Ataxic gait: Secondary | ICD-10-CM | POA: Diagnosis not present

## 2021-03-16 DIAGNOSIS — Z01419 Encounter for gynecological examination (general) (routine) without abnormal findings: Secondary | ICD-10-CM | POA: Diagnosis not present

## 2021-03-16 DIAGNOSIS — Z6824 Body mass index (BMI) 24.0-24.9, adult: Secondary | ICD-10-CM | POA: Diagnosis not present

## 2021-03-16 DIAGNOSIS — Z1231 Encounter for screening mammogram for malignant neoplasm of breast: Secondary | ICD-10-CM | POA: Diagnosis not present

## 2021-03-17 DIAGNOSIS — E1165 Type 2 diabetes mellitus with hyperglycemia: Secondary | ICD-10-CM | POA: Diagnosis not present

## 2021-03-18 DIAGNOSIS — R260 Ataxic gait: Secondary | ICD-10-CM | POA: Diagnosis not present

## 2021-03-22 DIAGNOSIS — R260 Ataxic gait: Secondary | ICD-10-CM | POA: Diagnosis not present

## 2021-03-24 DIAGNOSIS — F411 Generalized anxiety disorder: Secondary | ICD-10-CM | POA: Diagnosis not present

## 2021-03-24 DIAGNOSIS — F319 Bipolar disorder, unspecified: Secondary | ICD-10-CM | POA: Diagnosis not present

## 2021-03-24 DIAGNOSIS — F339 Major depressive disorder, recurrent, unspecified: Secondary | ICD-10-CM | POA: Diagnosis not present

## 2021-03-25 DIAGNOSIS — R260 Ataxic gait: Secondary | ICD-10-CM | POA: Diagnosis not present

## 2021-03-31 DIAGNOSIS — R260 Ataxic gait: Secondary | ICD-10-CM | POA: Diagnosis not present

## 2021-04-05 DIAGNOSIS — Z0000 Encounter for general adult medical examination without abnormal findings: Secondary | ICD-10-CM | POA: Diagnosis not present

## 2021-04-05 DIAGNOSIS — M8580 Other specified disorders of bone density and structure, unspecified site: Secondary | ICD-10-CM | POA: Diagnosis not present

## 2021-04-05 DIAGNOSIS — E1165 Type 2 diabetes mellitus with hyperglycemia: Secondary | ICD-10-CM | POA: Diagnosis not present

## 2021-04-05 DIAGNOSIS — R260 Ataxic gait: Secondary | ICD-10-CM | POA: Diagnosis not present

## 2021-04-05 DIAGNOSIS — N393 Stress incontinence (female) (male): Secondary | ICD-10-CM | POA: Diagnosis not present

## 2021-04-05 DIAGNOSIS — I10 Essential (primary) hypertension: Secondary | ICD-10-CM | POA: Diagnosis not present

## 2021-04-05 DIAGNOSIS — E782 Mixed hyperlipidemia: Secondary | ICD-10-CM | POA: Diagnosis not present

## 2021-04-05 DIAGNOSIS — F339 Major depressive disorder, recurrent, unspecified: Secondary | ICD-10-CM | POA: Diagnosis not present

## 2021-04-05 DIAGNOSIS — R296 Repeated falls: Secondary | ICD-10-CM | POA: Diagnosis not present

## 2021-04-08 DIAGNOSIS — R260 Ataxic gait: Secondary | ICD-10-CM | POA: Diagnosis not present

## 2021-04-12 DIAGNOSIS — U071 COVID-19: Secondary | ICD-10-CM | POA: Diagnosis not present

## 2021-04-13 DIAGNOSIS — U071 COVID-19: Secondary | ICD-10-CM | POA: Diagnosis not present

## 2021-04-22 DIAGNOSIS — R260 Ataxic gait: Secondary | ICD-10-CM | POA: Diagnosis not present

## 2021-04-26 DIAGNOSIS — R260 Ataxic gait: Secondary | ICD-10-CM | POA: Diagnosis not present

## 2021-04-29 DIAGNOSIS — R260 Ataxic gait: Secondary | ICD-10-CM | POA: Diagnosis not present

## 2021-05-12 DIAGNOSIS — R260 Ataxic gait: Secondary | ICD-10-CM | POA: Diagnosis not present

## 2021-05-17 DIAGNOSIS — R260 Ataxic gait: Secondary | ICD-10-CM | POA: Diagnosis not present

## 2021-05-20 DIAGNOSIS — R260 Ataxic gait: Secondary | ICD-10-CM | POA: Diagnosis not present

## 2021-05-24 DIAGNOSIS — R260 Ataxic gait: Secondary | ICD-10-CM | POA: Diagnosis not present

## 2021-05-27 DIAGNOSIS — R260 Ataxic gait: Secondary | ICD-10-CM | POA: Diagnosis not present

## 2021-05-31 DIAGNOSIS — R260 Ataxic gait: Secondary | ICD-10-CM | POA: Diagnosis not present

## 2021-06-03 DIAGNOSIS — R260 Ataxic gait: Secondary | ICD-10-CM | POA: Diagnosis not present

## 2021-06-09 DIAGNOSIS — R260 Ataxic gait: Secondary | ICD-10-CM | POA: Diagnosis not present

## 2021-06-14 DIAGNOSIS — R260 Ataxic gait: Secondary | ICD-10-CM | POA: Diagnosis not present

## 2021-06-17 DIAGNOSIS — R260 Ataxic gait: Secondary | ICD-10-CM | POA: Diagnosis not present

## 2021-06-21 DIAGNOSIS — R260 Ataxic gait: Secondary | ICD-10-CM | POA: Diagnosis not present

## 2021-06-24 DIAGNOSIS — R260 Ataxic gait: Secondary | ICD-10-CM | POA: Diagnosis not present

## 2021-06-28 DIAGNOSIS — R260 Ataxic gait: Secondary | ICD-10-CM | POA: Diagnosis not present

## 2021-07-05 DIAGNOSIS — R260 Ataxic gait: Secondary | ICD-10-CM | POA: Diagnosis not present

## 2021-07-07 DIAGNOSIS — F319 Bipolar disorder, unspecified: Secondary | ICD-10-CM | POA: Diagnosis not present

## 2021-07-07 DIAGNOSIS — F411 Generalized anxiety disorder: Secondary | ICD-10-CM | POA: Diagnosis not present

## 2021-07-07 DIAGNOSIS — F339 Major depressive disorder, recurrent, unspecified: Secondary | ICD-10-CM | POA: Diagnosis not present

## 2021-07-08 DIAGNOSIS — R260 Ataxic gait: Secondary | ICD-10-CM | POA: Diagnosis not present

## 2021-07-12 DIAGNOSIS — R260 Ataxic gait: Secondary | ICD-10-CM | POA: Diagnosis not present

## 2021-07-15 DIAGNOSIS — R260 Ataxic gait: Secondary | ICD-10-CM | POA: Diagnosis not present

## 2021-07-19 DIAGNOSIS — R260 Ataxic gait: Secondary | ICD-10-CM | POA: Diagnosis not present

## 2021-07-22 DIAGNOSIS — R260 Ataxic gait: Secondary | ICD-10-CM | POA: Diagnosis not present

## 2021-07-26 DIAGNOSIS — R260 Ataxic gait: Secondary | ICD-10-CM | POA: Diagnosis not present

## 2021-07-29 DIAGNOSIS — R260 Ataxic gait: Secondary | ICD-10-CM | POA: Diagnosis not present

## 2021-08-02 DIAGNOSIS — R260 Ataxic gait: Secondary | ICD-10-CM | POA: Diagnosis not present

## 2021-08-05 DIAGNOSIS — R260 Ataxic gait: Secondary | ICD-10-CM | POA: Diagnosis not present

## 2021-08-09 DIAGNOSIS — R260 Ataxic gait: Secondary | ICD-10-CM | POA: Diagnosis not present

## 2021-08-12 DIAGNOSIS — R260 Ataxic gait: Secondary | ICD-10-CM | POA: Diagnosis not present

## 2021-08-16 DIAGNOSIS — R260 Ataxic gait: Secondary | ICD-10-CM | POA: Diagnosis not present

## 2021-08-19 DIAGNOSIS — R260 Ataxic gait: Secondary | ICD-10-CM | POA: Diagnosis not present

## 2021-08-23 DIAGNOSIS — R260 Ataxic gait: Secondary | ICD-10-CM | POA: Diagnosis not present

## 2021-08-24 DIAGNOSIS — N3946 Mixed incontinence: Secondary | ICD-10-CM | POA: Diagnosis not present

## 2021-08-24 DIAGNOSIS — R351 Nocturia: Secondary | ICD-10-CM | POA: Diagnosis not present

## 2021-08-24 DIAGNOSIS — R350 Frequency of micturition: Secondary | ICD-10-CM | POA: Diagnosis not present

## 2021-08-26 DIAGNOSIS — R260 Ataxic gait: Secondary | ICD-10-CM | POA: Diagnosis not present

## 2021-08-30 DIAGNOSIS — R260 Ataxic gait: Secondary | ICD-10-CM | POA: Diagnosis not present

## 2021-09-02 DIAGNOSIS — R260 Ataxic gait: Secondary | ICD-10-CM | POA: Diagnosis not present

## 2021-09-08 DIAGNOSIS — F339 Major depressive disorder, recurrent, unspecified: Secondary | ICD-10-CM | POA: Diagnosis not present

## 2021-09-08 DIAGNOSIS — F411 Generalized anxiety disorder: Secondary | ICD-10-CM | POA: Diagnosis not present

## 2021-09-08 DIAGNOSIS — F319 Bipolar disorder, unspecified: Secondary | ICD-10-CM | POA: Diagnosis not present

## 2021-09-13 DIAGNOSIS — R260 Ataxic gait: Secondary | ICD-10-CM | POA: Diagnosis not present

## 2021-09-14 DIAGNOSIS — E1165 Type 2 diabetes mellitus with hyperglycemia: Secondary | ICD-10-CM | POA: Diagnosis not present

## 2021-09-15 DIAGNOSIS — E119 Type 2 diabetes mellitus without complications: Secondary | ICD-10-CM | POA: Diagnosis not present

## 2021-09-15 DIAGNOSIS — H52203 Unspecified astigmatism, bilateral: Secondary | ICD-10-CM | POA: Diagnosis not present

## 2021-09-15 DIAGNOSIS — Z961 Presence of intraocular lens: Secondary | ICD-10-CM | POA: Diagnosis not present

## 2021-09-16 DIAGNOSIS — R260 Ataxic gait: Secondary | ICD-10-CM | POA: Diagnosis not present

## 2021-09-20 DIAGNOSIS — R260 Ataxic gait: Secondary | ICD-10-CM | POA: Diagnosis not present

## 2021-09-23 DIAGNOSIS — R260 Ataxic gait: Secondary | ICD-10-CM | POA: Diagnosis not present

## 2021-09-27 DIAGNOSIS — R260 Ataxic gait: Secondary | ICD-10-CM | POA: Diagnosis not present

## 2021-09-30 DIAGNOSIS — R260 Ataxic gait: Secondary | ICD-10-CM | POA: Diagnosis not present

## 2021-10-05 DIAGNOSIS — E1165 Type 2 diabetes mellitus with hyperglycemia: Secondary | ICD-10-CM | POA: Diagnosis not present

## 2021-10-05 DIAGNOSIS — R260 Ataxic gait: Secondary | ICD-10-CM | POA: Diagnosis not present

## 2021-10-05 DIAGNOSIS — R2681 Unsteadiness on feet: Secondary | ICD-10-CM | POA: Diagnosis not present

## 2021-10-05 DIAGNOSIS — R413 Other amnesia: Secondary | ICD-10-CM | POA: Diagnosis not present

## 2021-10-05 DIAGNOSIS — R231 Pallor: Secondary | ICD-10-CM | POA: Diagnosis not present

## 2021-10-06 DIAGNOSIS — R350 Frequency of micturition: Secondary | ICD-10-CM | POA: Diagnosis not present

## 2021-10-07 DIAGNOSIS — R260 Ataxic gait: Secondary | ICD-10-CM | POA: Diagnosis not present

## 2021-10-11 DIAGNOSIS — R260 Ataxic gait: Secondary | ICD-10-CM | POA: Diagnosis not present

## 2021-10-18 DIAGNOSIS — R260 Ataxic gait: Secondary | ICD-10-CM | POA: Diagnosis not present

## 2021-10-21 DIAGNOSIS — R260 Ataxic gait: Secondary | ICD-10-CM | POA: Diagnosis not present

## 2021-10-25 DIAGNOSIS — R260 Ataxic gait: Secondary | ICD-10-CM | POA: Diagnosis not present

## 2021-10-27 DIAGNOSIS — N3946 Mixed incontinence: Secondary | ICD-10-CM | POA: Diagnosis not present

## 2021-10-28 DIAGNOSIS — R260 Ataxic gait: Secondary | ICD-10-CM | POA: Diagnosis not present

## 2021-10-29 DIAGNOSIS — Z87440 Personal history of urinary (tract) infections: Secondary | ICD-10-CM | POA: Diagnosis not present

## 2021-10-29 DIAGNOSIS — R61 Generalized hyperhidrosis: Secondary | ICD-10-CM | POA: Diagnosis not present

## 2021-11-01 DIAGNOSIS — N393 Stress incontinence (female) (male): Secondary | ICD-10-CM | POA: Diagnosis not present

## 2021-11-02 DIAGNOSIS — R260 Ataxic gait: Secondary | ICD-10-CM | POA: Diagnosis not present

## 2021-11-03 DIAGNOSIS — F319 Bipolar disorder, unspecified: Secondary | ICD-10-CM | POA: Diagnosis not present

## 2021-11-03 DIAGNOSIS — F411 Generalized anxiety disorder: Secondary | ICD-10-CM | POA: Diagnosis not present

## 2021-11-03 DIAGNOSIS — F339 Major depressive disorder, recurrent, unspecified: Secondary | ICD-10-CM | POA: Diagnosis not present

## 2021-11-04 DIAGNOSIS — R260 Ataxic gait: Secondary | ICD-10-CM | POA: Diagnosis not present

## 2021-11-08 DIAGNOSIS — R260 Ataxic gait: Secondary | ICD-10-CM | POA: Diagnosis not present

## 2021-11-11 DIAGNOSIS — R260 Ataxic gait: Secondary | ICD-10-CM | POA: Diagnosis not present

## 2021-11-15 DIAGNOSIS — R260 Ataxic gait: Secondary | ICD-10-CM | POA: Diagnosis not present

## 2021-11-18 DIAGNOSIS — R260 Ataxic gait: Secondary | ICD-10-CM | POA: Diagnosis not present

## 2021-11-19 DIAGNOSIS — R300 Dysuria: Secondary | ICD-10-CM | POA: Diagnosis not present

## 2021-11-22 DIAGNOSIS — R260 Ataxic gait: Secondary | ICD-10-CM | POA: Diagnosis not present

## 2021-11-25 DIAGNOSIS — R260 Ataxic gait: Secondary | ICD-10-CM | POA: Diagnosis not present

## 2021-11-29 DIAGNOSIS — R260 Ataxic gait: Secondary | ICD-10-CM | POA: Diagnosis not present

## 2021-12-02 DIAGNOSIS — R260 Ataxic gait: Secondary | ICD-10-CM | POA: Diagnosis not present

## 2021-12-06 DIAGNOSIS — R260 Ataxic gait: Secondary | ICD-10-CM | POA: Diagnosis not present

## 2021-12-09 DIAGNOSIS — R260 Ataxic gait: Secondary | ICD-10-CM | POA: Diagnosis not present

## 2021-12-16 DIAGNOSIS — R260 Ataxic gait: Secondary | ICD-10-CM | POA: Diagnosis not present

## 2021-12-20 DIAGNOSIS — R260 Ataxic gait: Secondary | ICD-10-CM | POA: Diagnosis not present

## 2021-12-23 DIAGNOSIS — R260 Ataxic gait: Secondary | ICD-10-CM | POA: Diagnosis not present

## 2021-12-27 DIAGNOSIS — R260 Ataxic gait: Secondary | ICD-10-CM | POA: Diagnosis not present

## 2021-12-30 DIAGNOSIS — R260 Ataxic gait: Secondary | ICD-10-CM | POA: Diagnosis not present

## 2022-01-07 DIAGNOSIS — R260 Ataxic gait: Secondary | ICD-10-CM | POA: Diagnosis not present

## 2022-01-11 DIAGNOSIS — R260 Ataxic gait: Secondary | ICD-10-CM | POA: Diagnosis not present

## 2022-01-14 DIAGNOSIS — R260 Ataxic gait: Secondary | ICD-10-CM | POA: Diagnosis not present

## 2022-01-18 DIAGNOSIS — R260 Ataxic gait: Secondary | ICD-10-CM | POA: Diagnosis not present

## 2022-01-24 DIAGNOSIS — R260 Ataxic gait: Secondary | ICD-10-CM | POA: Diagnosis not present

## 2022-01-27 DIAGNOSIS — R260 Ataxic gait: Secondary | ICD-10-CM | POA: Diagnosis not present

## 2022-01-31 DIAGNOSIS — R260 Ataxic gait: Secondary | ICD-10-CM | POA: Diagnosis not present

## 2022-02-03 DIAGNOSIS — R260 Ataxic gait: Secondary | ICD-10-CM | POA: Diagnosis not present

## 2022-02-07 DIAGNOSIS — R260 Ataxic gait: Secondary | ICD-10-CM | POA: Diagnosis not present

## 2022-02-10 DIAGNOSIS — R260 Ataxic gait: Secondary | ICD-10-CM | POA: Diagnosis not present

## 2022-02-14 DIAGNOSIS — R260 Ataxic gait: Secondary | ICD-10-CM | POA: Diagnosis not present

## 2022-02-17 DIAGNOSIS — R260 Ataxic gait: Secondary | ICD-10-CM | POA: Diagnosis not present

## 2022-02-21 DIAGNOSIS — R260 Ataxic gait: Secondary | ICD-10-CM | POA: Diagnosis not present

## 2022-02-23 DIAGNOSIS — N3946 Mixed incontinence: Secondary | ICD-10-CM | POA: Diagnosis not present

## 2022-02-24 DIAGNOSIS — R260 Ataxic gait: Secondary | ICD-10-CM | POA: Diagnosis not present

## 2022-02-28 DIAGNOSIS — R260 Ataxic gait: Secondary | ICD-10-CM | POA: Diagnosis not present

## 2022-03-01 DIAGNOSIS — Z23 Encounter for immunization: Secondary | ICD-10-CM | POA: Diagnosis not present

## 2022-03-02 DIAGNOSIS — F319 Bipolar disorder, unspecified: Secondary | ICD-10-CM | POA: Diagnosis not present

## 2022-03-02 DIAGNOSIS — F411 Generalized anxiety disorder: Secondary | ICD-10-CM | POA: Diagnosis not present

## 2022-03-02 DIAGNOSIS — F339 Major depressive disorder, recurrent, unspecified: Secondary | ICD-10-CM | POA: Diagnosis not present

## 2022-03-03 DIAGNOSIS — R260 Ataxic gait: Secondary | ICD-10-CM | POA: Diagnosis not present

## 2022-03-07 DIAGNOSIS — R260 Ataxic gait: Secondary | ICD-10-CM | POA: Diagnosis not present

## 2022-03-09 DIAGNOSIS — R260 Ataxic gait: Secondary | ICD-10-CM | POA: Diagnosis not present

## 2022-03-14 DIAGNOSIS — R260 Ataxic gait: Secondary | ICD-10-CM | POA: Diagnosis not present

## 2022-03-17 DIAGNOSIS — R260 Ataxic gait: Secondary | ICD-10-CM | POA: Diagnosis not present

## 2022-03-21 DIAGNOSIS — R260 Ataxic gait: Secondary | ICD-10-CM | POA: Diagnosis not present

## 2022-03-24 DIAGNOSIS — R260 Ataxic gait: Secondary | ICD-10-CM | POA: Diagnosis not present

## 2022-03-28 DIAGNOSIS — R260 Ataxic gait: Secondary | ICD-10-CM | POA: Diagnosis not present

## 2022-03-29 DIAGNOSIS — E7800 Pure hypercholesterolemia, unspecified: Secondary | ICD-10-CM | POA: Diagnosis not present

## 2022-03-29 DIAGNOSIS — R809 Proteinuria, unspecified: Secondary | ICD-10-CM | POA: Diagnosis not present

## 2022-03-29 DIAGNOSIS — M21371 Foot drop, right foot: Secondary | ICD-10-CM | POA: Diagnosis not present

## 2022-03-29 DIAGNOSIS — I10 Essential (primary) hypertension: Secondary | ICD-10-CM | POA: Diagnosis not present

## 2022-03-29 DIAGNOSIS — Z794 Long term (current) use of insulin: Secondary | ICD-10-CM | POA: Diagnosis not present

## 2022-03-29 DIAGNOSIS — E1165 Type 2 diabetes mellitus with hyperglycemia: Secondary | ICD-10-CM | POA: Diagnosis not present

## 2022-03-29 DIAGNOSIS — E1140 Type 2 diabetes mellitus with diabetic neuropathy, unspecified: Secondary | ICD-10-CM | POA: Diagnosis not present

## 2022-03-30 DIAGNOSIS — R260 Ataxic gait: Secondary | ICD-10-CM | POA: Diagnosis not present

## 2022-04-04 DIAGNOSIS — R260 Ataxic gait: Secondary | ICD-10-CM | POA: Diagnosis not present

## 2022-04-07 DIAGNOSIS — R260 Ataxic gait: Secondary | ICD-10-CM | POA: Diagnosis not present

## 2022-04-11 DIAGNOSIS — Z0000 Encounter for general adult medical examination without abnormal findings: Secondary | ICD-10-CM | POA: Diagnosis not present

## 2022-04-11 DIAGNOSIS — Z79899 Other long term (current) drug therapy: Secondary | ICD-10-CM | POA: Diagnosis not present

## 2022-04-11 DIAGNOSIS — I10 Essential (primary) hypertension: Secondary | ICD-10-CM | POA: Diagnosis not present

## 2022-04-11 DIAGNOSIS — M8580 Other specified disorders of bone density and structure, unspecified site: Secondary | ICD-10-CM | POA: Diagnosis not present

## 2022-04-11 DIAGNOSIS — R413 Other amnesia: Secondary | ICD-10-CM | POA: Diagnosis not present

## 2022-04-11 DIAGNOSIS — R296 Repeated falls: Secondary | ICD-10-CM | POA: Diagnosis not present

## 2022-04-11 DIAGNOSIS — E1165 Type 2 diabetes mellitus with hyperglycemia: Secondary | ICD-10-CM | POA: Diagnosis not present

## 2022-04-11 DIAGNOSIS — E782 Mixed hyperlipidemia: Secondary | ICD-10-CM | POA: Diagnosis not present

## 2022-04-11 DIAGNOSIS — N6322 Unspecified lump in the left breast, upper inner quadrant: Secondary | ICD-10-CM | POA: Diagnosis not present

## 2022-04-11 DIAGNOSIS — N393 Stress incontinence (female) (male): Secondary | ICD-10-CM | POA: Diagnosis not present

## 2022-04-11 DIAGNOSIS — F339 Major depressive disorder, recurrent, unspecified: Secondary | ICD-10-CM | POA: Diagnosis not present

## 2022-04-11 DIAGNOSIS — R260 Ataxic gait: Secondary | ICD-10-CM | POA: Diagnosis not present

## 2022-04-14 DIAGNOSIS — R260 Ataxic gait: Secondary | ICD-10-CM | POA: Diagnosis not present

## 2022-04-18 DIAGNOSIS — R260 Ataxic gait: Secondary | ICD-10-CM | POA: Diagnosis not present

## 2022-04-19 DIAGNOSIS — Z1231 Encounter for screening mammogram for malignant neoplasm of breast: Secondary | ICD-10-CM | POA: Diagnosis not present

## 2022-04-19 DIAGNOSIS — Z1151 Encounter for screening for human papillomavirus (HPV): Secondary | ICD-10-CM | POA: Diagnosis not present

## 2022-04-19 DIAGNOSIS — N63 Unspecified lump in unspecified breast: Secondary | ICD-10-CM | POA: Diagnosis not present

## 2022-04-19 DIAGNOSIS — Z124 Encounter for screening for malignant neoplasm of cervix: Secondary | ICD-10-CM | POA: Diagnosis not present

## 2022-04-19 DIAGNOSIS — Z6822 Body mass index (BMI) 22.0-22.9, adult: Secondary | ICD-10-CM | POA: Diagnosis not present

## 2022-04-20 ENCOUNTER — Other Ambulatory Visit: Payer: Self-pay | Admitting: Obstetrics and Gynecology

## 2022-04-20 DIAGNOSIS — N6320 Unspecified lump in the left breast, unspecified quadrant: Secondary | ICD-10-CM

## 2022-04-21 DIAGNOSIS — R260 Ataxic gait: Secondary | ICD-10-CM | POA: Diagnosis not present

## 2022-04-25 DIAGNOSIS — R260 Ataxic gait: Secondary | ICD-10-CM | POA: Diagnosis not present

## 2022-04-28 DIAGNOSIS — R260 Ataxic gait: Secondary | ICD-10-CM | POA: Diagnosis not present

## 2022-05-03 DIAGNOSIS — R260 Ataxic gait: Secondary | ICD-10-CM | POA: Diagnosis not present

## 2022-05-05 DIAGNOSIS — R260 Ataxic gait: Secondary | ICD-10-CM | POA: Diagnosis not present

## 2022-05-10 ENCOUNTER — Ambulatory Visit
Admission: RE | Admit: 2022-05-10 | Discharge: 2022-05-10 | Disposition: A | Discharge status: Home / Self Care | Payer: Medicare Other | Source: Ambulatory Visit | Attending: Obstetrics and Gynecology | Admitting: Obstetrics and Gynecology

## 2022-05-10 DIAGNOSIS — N6002 Solitary cyst of left breast: Secondary | ICD-10-CM | POA: Diagnosis not present

## 2022-05-10 DIAGNOSIS — N6320 Unspecified lump in the left breast, unspecified quadrant: Secondary | ICD-10-CM

## 2022-05-10 DIAGNOSIS — R922 Inconclusive mammogram: Secondary | ICD-10-CM | POA: Diagnosis not present

## 2022-05-10 DIAGNOSIS — R260 Ataxic gait: Secondary | ICD-10-CM | POA: Diagnosis not present

## 2022-05-10 HISTORY — DX: Personal history of irradiation: Z92.3

## 2022-05-12 DIAGNOSIS — R260 Ataxic gait: Secondary | ICD-10-CM | POA: Diagnosis not present

## 2022-05-16 DIAGNOSIS — R260 Ataxic gait: Secondary | ICD-10-CM | POA: Diagnosis not present

## 2022-05-19 DIAGNOSIS — R260 Ataxic gait: Secondary | ICD-10-CM | POA: Diagnosis not present

## 2022-05-23 DIAGNOSIS — R260 Ataxic gait: Secondary | ICD-10-CM | POA: Diagnosis not present

## 2022-05-26 DIAGNOSIS — R260 Ataxic gait: Secondary | ICD-10-CM | POA: Diagnosis not present

## 2022-05-30 DIAGNOSIS — R260 Ataxic gait: Secondary | ICD-10-CM | POA: Diagnosis not present

## 2022-06-02 DIAGNOSIS — R260 Ataxic gait: Secondary | ICD-10-CM | POA: Diagnosis not present

## 2022-06-06 DIAGNOSIS — R260 Ataxic gait: Secondary | ICD-10-CM | POA: Diagnosis not present

## 2022-06-08 DIAGNOSIS — F411 Generalized anxiety disorder: Secondary | ICD-10-CM | POA: Diagnosis not present

## 2022-06-08 DIAGNOSIS — F339 Major depressive disorder, recurrent, unspecified: Secondary | ICD-10-CM | POA: Diagnosis not present

## 2022-06-08 DIAGNOSIS — F319 Bipolar disorder, unspecified: Secondary | ICD-10-CM | POA: Diagnosis not present

## 2022-06-09 DIAGNOSIS — R260 Ataxic gait: Secondary | ICD-10-CM | POA: Diagnosis not present

## 2022-06-13 DIAGNOSIS — R260 Ataxic gait: Secondary | ICD-10-CM | POA: Diagnosis not present

## 2022-06-20 DIAGNOSIS — R260 Ataxic gait: Secondary | ICD-10-CM | POA: Diagnosis not present

## 2022-06-23 DIAGNOSIS — R260 Ataxic gait: Secondary | ICD-10-CM | POA: Diagnosis not present

## 2022-06-27 DIAGNOSIS — R260 Ataxic gait: Secondary | ICD-10-CM | POA: Diagnosis not present

## 2022-06-30 DIAGNOSIS — R260 Ataxic gait: Secondary | ICD-10-CM | POA: Diagnosis not present

## 2022-07-04 DIAGNOSIS — R260 Ataxic gait: Secondary | ICD-10-CM | POA: Diagnosis not present

## 2022-07-07 DIAGNOSIS — R260 Ataxic gait: Secondary | ICD-10-CM | POA: Diagnosis not present

## 2022-07-11 DIAGNOSIS — R260 Ataxic gait: Secondary | ICD-10-CM | POA: Diagnosis not present

## 2022-07-14 DIAGNOSIS — R260 Ataxic gait: Secondary | ICD-10-CM | POA: Diagnosis not present

## 2022-07-18 DIAGNOSIS — R260 Ataxic gait: Secondary | ICD-10-CM | POA: Diagnosis not present

## 2022-07-21 DIAGNOSIS — R260 Ataxic gait: Secondary | ICD-10-CM | POA: Diagnosis not present

## 2022-07-25 DIAGNOSIS — R260 Ataxic gait: Secondary | ICD-10-CM | POA: Diagnosis not present

## 2022-07-28 DIAGNOSIS — R260 Ataxic gait: Secondary | ICD-10-CM | POA: Diagnosis not present

## 2022-08-01 DIAGNOSIS — R260 Ataxic gait: Secondary | ICD-10-CM | POA: Diagnosis not present

## 2022-08-04 DIAGNOSIS — R260 Ataxic gait: Secondary | ICD-10-CM | POA: Diagnosis not present

## 2022-08-08 DIAGNOSIS — R260 Ataxic gait: Secondary | ICD-10-CM | POA: Diagnosis not present

## 2022-08-11 DIAGNOSIS — R260 Ataxic gait: Secondary | ICD-10-CM | POA: Diagnosis not present

## 2022-08-15 DIAGNOSIS — R260 Ataxic gait: Secondary | ICD-10-CM | POA: Diagnosis not present

## 2022-08-18 DIAGNOSIS — R260 Ataxic gait: Secondary | ICD-10-CM | POA: Diagnosis not present

## 2022-08-22 DIAGNOSIS — R260 Ataxic gait: Secondary | ICD-10-CM | POA: Diagnosis not present

## 2022-08-25 DIAGNOSIS — R260 Ataxic gait: Secondary | ICD-10-CM | POA: Diagnosis not present

## 2022-08-29 DIAGNOSIS — R260 Ataxic gait: Secondary | ICD-10-CM | POA: Diagnosis not present

## 2022-09-01 DIAGNOSIS — R260 Ataxic gait: Secondary | ICD-10-CM | POA: Diagnosis not present

## 2022-09-05 DIAGNOSIS — R260 Ataxic gait: Secondary | ICD-10-CM | POA: Diagnosis not present

## 2022-09-07 DIAGNOSIS — F411 Generalized anxiety disorder: Secondary | ICD-10-CM | POA: Diagnosis not present

## 2022-09-07 DIAGNOSIS — F319 Bipolar disorder, unspecified: Secondary | ICD-10-CM | POA: Diagnosis not present

## 2022-09-07 DIAGNOSIS — F339 Major depressive disorder, recurrent, unspecified: Secondary | ICD-10-CM | POA: Diagnosis not present

## 2022-09-08 DIAGNOSIS — R260 Ataxic gait: Secondary | ICD-10-CM | POA: Diagnosis not present

## 2022-09-12 DIAGNOSIS — R260 Ataxic gait: Secondary | ICD-10-CM | POA: Diagnosis not present

## 2022-09-15 DIAGNOSIS — R260 Ataxic gait: Secondary | ICD-10-CM | POA: Diagnosis not present

## 2022-09-19 DIAGNOSIS — R260 Ataxic gait: Secondary | ICD-10-CM | POA: Diagnosis not present

## 2022-09-22 DIAGNOSIS — R260 Ataxic gait: Secondary | ICD-10-CM | POA: Diagnosis not present

## 2022-09-26 DIAGNOSIS — R260 Ataxic gait: Secondary | ICD-10-CM | POA: Diagnosis not present

## 2022-09-27 DIAGNOSIS — E1140 Type 2 diabetes mellitus with diabetic neuropathy, unspecified: Secondary | ICD-10-CM | POA: Diagnosis not present

## 2022-09-27 DIAGNOSIS — I10 Essential (primary) hypertension: Secondary | ICD-10-CM | POA: Diagnosis not present

## 2022-09-27 DIAGNOSIS — M21371 Foot drop, right foot: Secondary | ICD-10-CM | POA: Diagnosis not present

## 2022-09-27 DIAGNOSIS — E1165 Type 2 diabetes mellitus with hyperglycemia: Secondary | ICD-10-CM | POA: Diagnosis not present

## 2022-09-27 DIAGNOSIS — R809 Proteinuria, unspecified: Secondary | ICD-10-CM | POA: Diagnosis not present

## 2022-09-27 DIAGNOSIS — E7800 Pure hypercholesterolemia, unspecified: Secondary | ICD-10-CM | POA: Diagnosis not present

## 2022-09-27 DIAGNOSIS — Z794 Long term (current) use of insulin: Secondary | ICD-10-CM | POA: Diagnosis not present

## 2022-09-28 DIAGNOSIS — E119 Type 2 diabetes mellitus without complications: Secondary | ICD-10-CM | POA: Diagnosis not present

## 2022-09-28 DIAGNOSIS — Z961 Presence of intraocular lens: Secondary | ICD-10-CM | POA: Diagnosis not present

## 2022-09-28 DIAGNOSIS — H52203 Unspecified astigmatism, bilateral: Secondary | ICD-10-CM | POA: Diagnosis not present

## 2022-09-29 DIAGNOSIS — R260 Ataxic gait: Secondary | ICD-10-CM | POA: Diagnosis not present

## 2022-10-06 DIAGNOSIS — R260 Ataxic gait: Secondary | ICD-10-CM | POA: Diagnosis not present

## 2022-10-10 DIAGNOSIS — R260 Ataxic gait: Secondary | ICD-10-CM | POA: Diagnosis not present

## 2022-10-11 DIAGNOSIS — R413 Other amnesia: Secondary | ICD-10-CM | POA: Diagnosis not present

## 2022-10-11 DIAGNOSIS — I10 Essential (primary) hypertension: Secondary | ICD-10-CM | POA: Diagnosis not present

## 2022-10-11 DIAGNOSIS — E871 Hypo-osmolality and hyponatremia: Secondary | ICD-10-CM | POA: Diagnosis not present

## 2022-10-11 DIAGNOSIS — E1165 Type 2 diabetes mellitus with hyperglycemia: Secondary | ICD-10-CM | POA: Diagnosis not present

## 2022-10-11 DIAGNOSIS — F339 Major depressive disorder, recurrent, unspecified: Secondary | ICD-10-CM | POA: Diagnosis not present

## 2022-10-12 DIAGNOSIS — M8588 Other specified disorders of bone density and structure, other site: Secondary | ICD-10-CM | POA: Diagnosis not present

## 2022-10-12 DIAGNOSIS — N958 Other specified menopausal and perimenopausal disorders: Secondary | ICD-10-CM | POA: Diagnosis not present

## 2022-10-13 DIAGNOSIS — R260 Ataxic gait: Secondary | ICD-10-CM | POA: Diagnosis not present

## 2022-10-17 DIAGNOSIS — R260 Ataxic gait: Secondary | ICD-10-CM | POA: Diagnosis not present

## 2022-10-19 DIAGNOSIS — F411 Generalized anxiety disorder: Secondary | ICD-10-CM | POA: Diagnosis not present

## 2022-10-19 DIAGNOSIS — F339 Major depressive disorder, recurrent, unspecified: Secondary | ICD-10-CM | POA: Diagnosis not present

## 2022-10-19 DIAGNOSIS — F319 Bipolar disorder, unspecified: Secondary | ICD-10-CM | POA: Diagnosis not present

## 2022-10-20 DIAGNOSIS — R260 Ataxic gait: Secondary | ICD-10-CM | POA: Diagnosis not present

## 2022-10-24 DIAGNOSIS — R260 Ataxic gait: Secondary | ICD-10-CM | POA: Diagnosis not present

## 2022-10-27 DIAGNOSIS — R260 Ataxic gait: Secondary | ICD-10-CM | POA: Diagnosis not present

## 2022-10-31 DIAGNOSIS — R260 Ataxic gait: Secondary | ICD-10-CM | POA: Diagnosis not present

## 2022-11-03 DIAGNOSIS — R260 Ataxic gait: Secondary | ICD-10-CM | POA: Diagnosis not present

## 2022-11-07 DIAGNOSIS — R260 Ataxic gait: Secondary | ICD-10-CM | POA: Diagnosis not present

## 2022-11-09 DIAGNOSIS — R260 Ataxic gait: Secondary | ICD-10-CM | POA: Diagnosis not present

## 2022-11-14 DIAGNOSIS — R260 Ataxic gait: Secondary | ICD-10-CM | POA: Diagnosis not present

## 2022-11-17 DIAGNOSIS — R260 Ataxic gait: Secondary | ICD-10-CM | POA: Diagnosis not present

## 2022-11-21 DIAGNOSIS — R260 Ataxic gait: Secondary | ICD-10-CM | POA: Diagnosis not present

## 2022-11-24 DIAGNOSIS — R260 Ataxic gait: Secondary | ICD-10-CM | POA: Diagnosis not present

## 2022-11-28 DIAGNOSIS — R260 Ataxic gait: Secondary | ICD-10-CM | POA: Diagnosis not present

## 2022-12-01 DIAGNOSIS — R260 Ataxic gait: Secondary | ICD-10-CM | POA: Diagnosis not present

## 2022-12-05 DIAGNOSIS — R260 Ataxic gait: Secondary | ICD-10-CM | POA: Diagnosis not present

## 2022-12-08 DIAGNOSIS — R260 Ataxic gait: Secondary | ICD-10-CM | POA: Diagnosis not present

## 2022-12-12 DIAGNOSIS — R260 Ataxic gait: Secondary | ICD-10-CM | POA: Diagnosis not present

## 2022-12-14 DIAGNOSIS — F319 Bipolar disorder, unspecified: Secondary | ICD-10-CM | POA: Diagnosis not present

## 2022-12-14 DIAGNOSIS — F339 Major depressive disorder, recurrent, unspecified: Secondary | ICD-10-CM | POA: Diagnosis not present

## 2022-12-14 DIAGNOSIS — F411 Generalized anxiety disorder: Secondary | ICD-10-CM | POA: Diagnosis not present

## 2022-12-15 DIAGNOSIS — R260 Ataxic gait: Secondary | ICD-10-CM | POA: Diagnosis not present

## 2022-12-19 DIAGNOSIS — R260 Ataxic gait: Secondary | ICD-10-CM | POA: Diagnosis not present

## 2022-12-22 DIAGNOSIS — R260 Ataxic gait: Secondary | ICD-10-CM | POA: Diagnosis not present

## 2022-12-26 DIAGNOSIS — R260 Ataxic gait: Secondary | ICD-10-CM | POA: Diagnosis not present

## 2022-12-29 DIAGNOSIS — R260 Ataxic gait: Secondary | ICD-10-CM | POA: Diagnosis not present

## 2023-01-02 DIAGNOSIS — R260 Ataxic gait: Secondary | ICD-10-CM | POA: Diagnosis not present

## 2023-01-05 DIAGNOSIS — R260 Ataxic gait: Secondary | ICD-10-CM | POA: Diagnosis not present

## 2023-01-10 DIAGNOSIS — R260 Ataxic gait: Secondary | ICD-10-CM | POA: Diagnosis not present

## 2023-01-12 DIAGNOSIS — R260 Ataxic gait: Secondary | ICD-10-CM | POA: Diagnosis not present

## 2023-01-16 DIAGNOSIS — R260 Ataxic gait: Secondary | ICD-10-CM | POA: Diagnosis not present

## 2023-01-19 DIAGNOSIS — R260 Ataxic gait: Secondary | ICD-10-CM | POA: Diagnosis not present

## 2023-01-23 DIAGNOSIS — R260 Ataxic gait: Secondary | ICD-10-CM | POA: Diagnosis not present

## 2023-01-26 DIAGNOSIS — R260 Ataxic gait: Secondary | ICD-10-CM | POA: Diagnosis not present

## 2023-01-30 DIAGNOSIS — R260 Ataxic gait: Secondary | ICD-10-CM | POA: Diagnosis not present

## 2023-01-30 DIAGNOSIS — Z23 Encounter for immunization: Secondary | ICD-10-CM | POA: Diagnosis not present

## 2023-02-02 DIAGNOSIS — R260 Ataxic gait: Secondary | ICD-10-CM | POA: Diagnosis not present

## 2023-02-06 DIAGNOSIS — R260 Ataxic gait: Secondary | ICD-10-CM | POA: Diagnosis not present

## 2023-02-09 DIAGNOSIS — R260 Ataxic gait: Secondary | ICD-10-CM | POA: Diagnosis not present

## 2023-02-13 DIAGNOSIS — R260 Ataxic gait: Secondary | ICD-10-CM | POA: Diagnosis not present

## 2023-02-14 DIAGNOSIS — Z23 Encounter for immunization: Secondary | ICD-10-CM | POA: Diagnosis not present

## 2023-02-15 DIAGNOSIS — F411 Generalized anxiety disorder: Secondary | ICD-10-CM | POA: Diagnosis not present

## 2023-02-15 DIAGNOSIS — F319 Bipolar disorder, unspecified: Secondary | ICD-10-CM | POA: Diagnosis not present

## 2023-02-15 DIAGNOSIS — F339 Major depressive disorder, recurrent, unspecified: Secondary | ICD-10-CM | POA: Diagnosis not present

## 2023-02-16 DIAGNOSIS — R260 Ataxic gait: Secondary | ICD-10-CM | POA: Diagnosis not present

## 2023-02-20 DIAGNOSIS — R260 Ataxic gait: Secondary | ICD-10-CM | POA: Diagnosis not present

## 2023-02-23 DIAGNOSIS — R260 Ataxic gait: Secondary | ICD-10-CM | POA: Diagnosis not present

## 2023-02-27 DIAGNOSIS — R260 Ataxic gait: Secondary | ICD-10-CM | POA: Diagnosis not present

## 2023-03-02 DIAGNOSIS — R260 Ataxic gait: Secondary | ICD-10-CM | POA: Diagnosis not present

## 2023-03-06 DIAGNOSIS — R260 Ataxic gait: Secondary | ICD-10-CM | POA: Diagnosis not present

## 2023-03-09 DIAGNOSIS — R260 Ataxic gait: Secondary | ICD-10-CM | POA: Diagnosis not present

## 2023-03-13 DIAGNOSIS — R260 Ataxic gait: Secondary | ICD-10-CM | POA: Diagnosis not present

## 2023-03-15 DIAGNOSIS — N3946 Mixed incontinence: Secondary | ICD-10-CM | POA: Diagnosis not present

## 2023-03-16 DIAGNOSIS — R260 Ataxic gait: Secondary | ICD-10-CM | POA: Diagnosis not present

## 2023-03-20 DIAGNOSIS — R260 Ataxic gait: Secondary | ICD-10-CM | POA: Diagnosis not present

## 2023-03-23 DIAGNOSIS — R260 Ataxic gait: Secondary | ICD-10-CM | POA: Diagnosis not present

## 2023-03-27 DIAGNOSIS — R260 Ataxic gait: Secondary | ICD-10-CM | POA: Diagnosis not present

## 2023-03-29 DIAGNOSIS — R809 Proteinuria, unspecified: Secondary | ICD-10-CM | POA: Diagnosis not present

## 2023-03-29 DIAGNOSIS — Z794 Long term (current) use of insulin: Secondary | ICD-10-CM | POA: Diagnosis not present

## 2023-03-29 DIAGNOSIS — K7689 Other specified diseases of liver: Secondary | ICD-10-CM | POA: Diagnosis not present

## 2023-03-29 DIAGNOSIS — E1140 Type 2 diabetes mellitus with diabetic neuropathy, unspecified: Secondary | ICD-10-CM | POA: Diagnosis not present

## 2023-03-29 DIAGNOSIS — M21371 Foot drop, right foot: Secondary | ICD-10-CM | POA: Diagnosis not present

## 2023-03-29 DIAGNOSIS — E1165 Type 2 diabetes mellitus with hyperglycemia: Secondary | ICD-10-CM | POA: Diagnosis not present

## 2023-03-29 DIAGNOSIS — I10 Essential (primary) hypertension: Secondary | ICD-10-CM | POA: Diagnosis not present

## 2023-03-29 DIAGNOSIS — E7800 Pure hypercholesterolemia, unspecified: Secondary | ICD-10-CM | POA: Diagnosis not present

## 2023-03-30 DIAGNOSIS — R260 Ataxic gait: Secondary | ICD-10-CM | POA: Diagnosis not present

## 2023-04-03 DIAGNOSIS — R260 Ataxic gait: Secondary | ICD-10-CM | POA: Diagnosis not present

## 2023-04-10 DIAGNOSIS — R260 Ataxic gait: Secondary | ICD-10-CM | POA: Diagnosis not present

## 2023-04-13 DIAGNOSIS — R260 Ataxic gait: Secondary | ICD-10-CM | POA: Diagnosis not present

## 2023-04-17 DIAGNOSIS — R260 Ataxic gait: Secondary | ICD-10-CM | POA: Diagnosis not present

## 2023-04-20 DIAGNOSIS — R260 Ataxic gait: Secondary | ICD-10-CM | POA: Diagnosis not present

## 2023-04-24 DIAGNOSIS — R260 Ataxic gait: Secondary | ICD-10-CM | POA: Diagnosis not present

## 2023-04-26 DIAGNOSIS — F319 Bipolar disorder, unspecified: Secondary | ICD-10-CM | POA: Diagnosis not present

## 2023-04-26 DIAGNOSIS — R61 Generalized hyperhidrosis: Secondary | ICD-10-CM | POA: Diagnosis not present

## 2023-04-26 DIAGNOSIS — F339 Major depressive disorder, recurrent, unspecified: Secondary | ICD-10-CM | POA: Diagnosis not present

## 2023-04-26 DIAGNOSIS — E1165 Type 2 diabetes mellitus with hyperglycemia: Secondary | ICD-10-CM | POA: Diagnosis not present

## 2023-04-26 DIAGNOSIS — K5900 Constipation, unspecified: Secondary | ICD-10-CM | POA: Diagnosis not present

## 2023-04-26 DIAGNOSIS — Z0000 Encounter for general adult medical examination without abnormal findings: Secondary | ICD-10-CM | POA: Diagnosis not present

## 2023-04-26 DIAGNOSIS — R413 Other amnesia: Secondary | ICD-10-CM | POA: Diagnosis not present

## 2023-04-26 DIAGNOSIS — R296 Repeated falls: Secondary | ICD-10-CM | POA: Diagnosis not present

## 2023-04-26 DIAGNOSIS — N393 Stress incontinence (female) (male): Secondary | ICD-10-CM | POA: Diagnosis not present

## 2023-04-26 DIAGNOSIS — F411 Generalized anxiety disorder: Secondary | ICD-10-CM | POA: Diagnosis not present

## 2023-04-26 DIAGNOSIS — Z79899 Other long term (current) drug therapy: Secondary | ICD-10-CM | POA: Diagnosis not present

## 2023-04-26 DIAGNOSIS — I10 Essential (primary) hypertension: Secondary | ICD-10-CM | POA: Diagnosis not present

## 2023-04-26 DIAGNOSIS — E782 Mixed hyperlipidemia: Secondary | ICD-10-CM | POA: Diagnosis not present

## 2023-04-27 DIAGNOSIS — N390 Urinary tract infection, site not specified: Secondary | ICD-10-CM | POA: Diagnosis not present

## 2023-04-27 DIAGNOSIS — R260 Ataxic gait: Secondary | ICD-10-CM | POA: Diagnosis not present

## 2023-04-27 DIAGNOSIS — R61 Generalized hyperhidrosis: Secondary | ICD-10-CM | POA: Diagnosis not present

## 2023-05-01 DIAGNOSIS — R260 Ataxic gait: Secondary | ICD-10-CM | POA: Diagnosis not present

## 2023-06-16 IMAGING — CT CT HEAD W/O CM
4 series · 17 of 47 positions shown, 19 images · non-contrast
Comparison: MRI brain 05/28/2020.

CLINICAL DATA: Head trauma.

EXAM:
CT HEAD WITHOUT CONTRAST
TECHNIQUE: Contiguous axial images were obtained from the base of the skull
through the vertex without intravenous contrast.

[Series 2: head wo · axial · 0.44mm/px · z∈[-166,-51]mm · 7 of 31 slices shown, 9 images]
[im 4/31  brain]
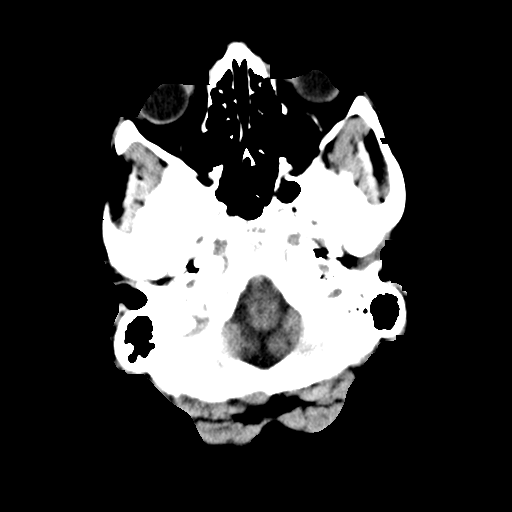
[im 4/31  bone]
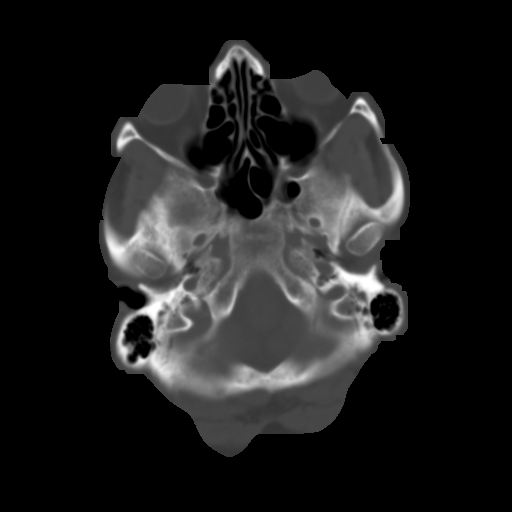
[im 8/31  brain]
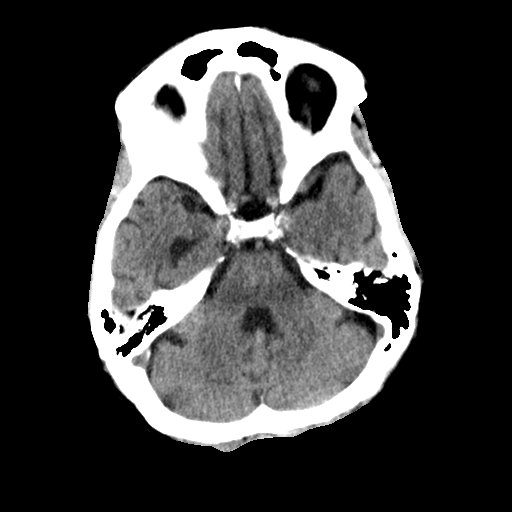
[im 12/31  brain]
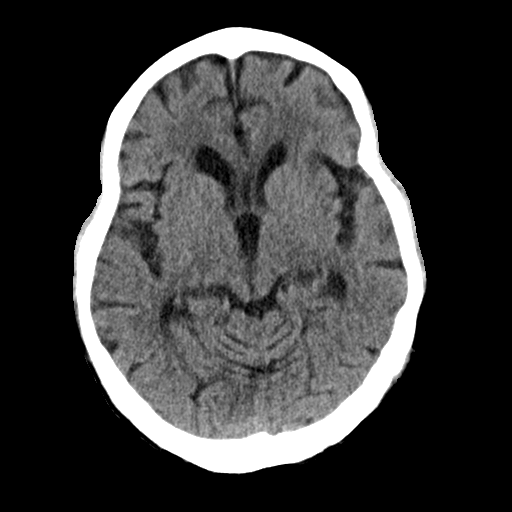
[im 16/31  brain]
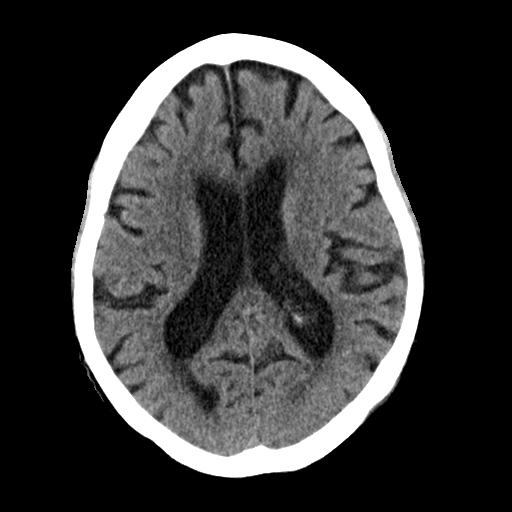
[im 19/31  brain]
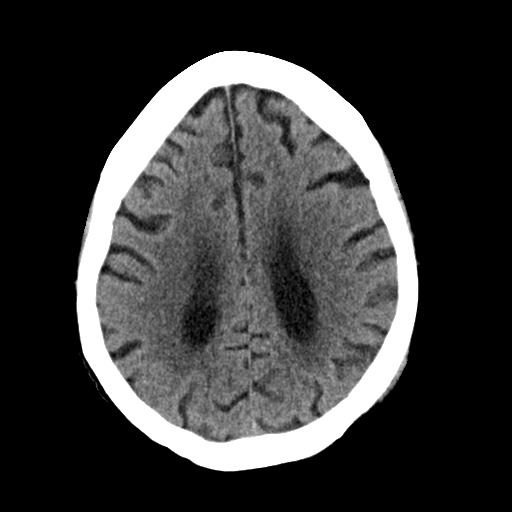
[im 19/31  bone]
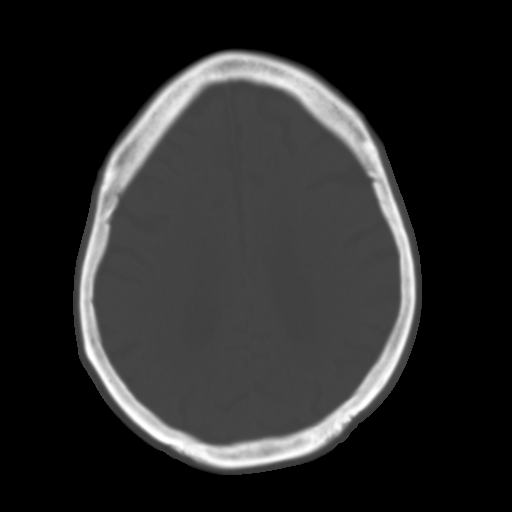
[im 23/31  brain]
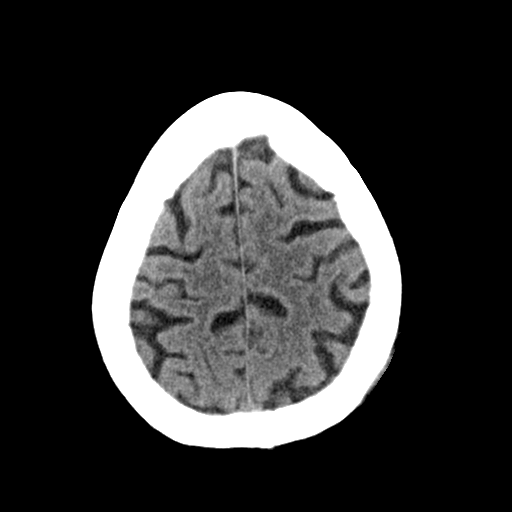
[im 27/31  brain]
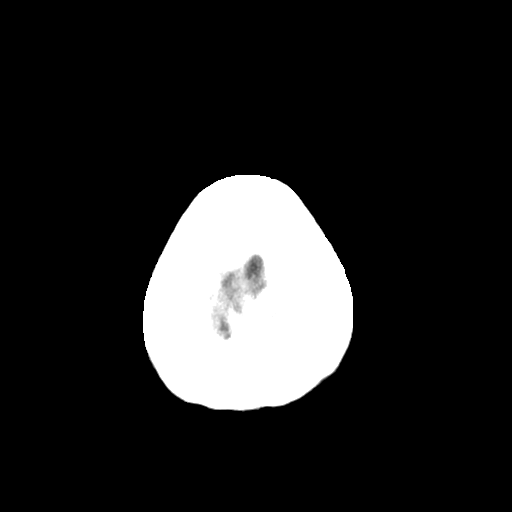

[Series 3: head bone · axial · 0.44mm/px · z∈[-167,-113]mm · 4 of 77 slices shown]
[im 8/77  bone]
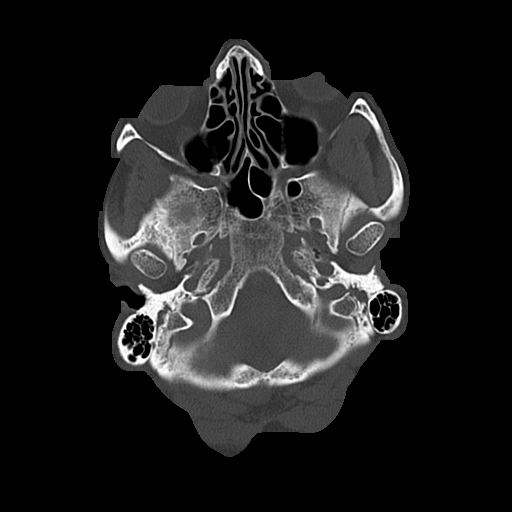
[im 16/77  bone]
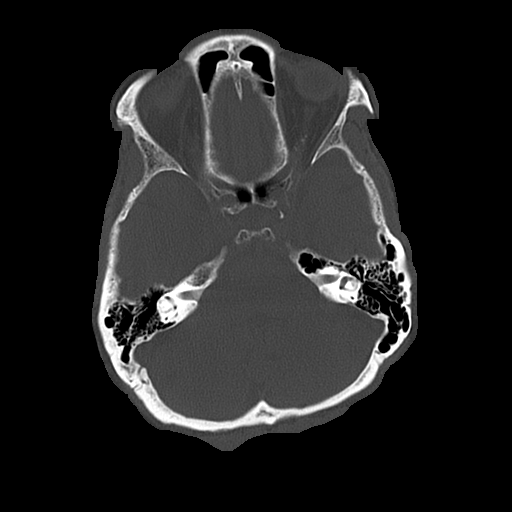
[im 23/77  bone]
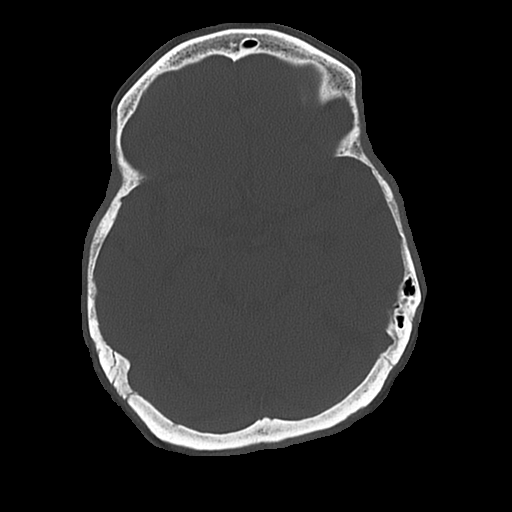
[im 35/77  bone]
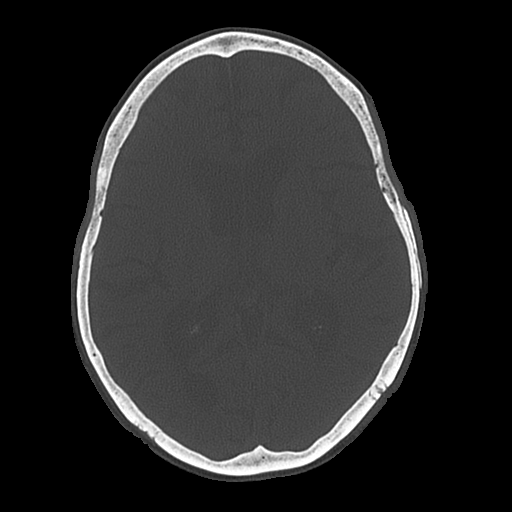

[Series 4: coronal soft · coronal · 0.38mm/px · 3 of 67 slices shown]
[im 23/67  brain]
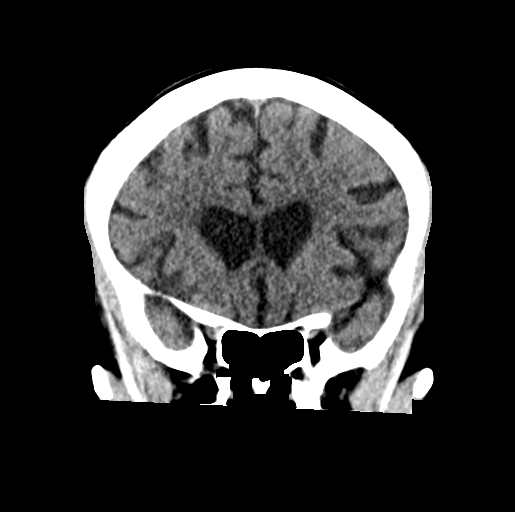
[im 30/67  brain]
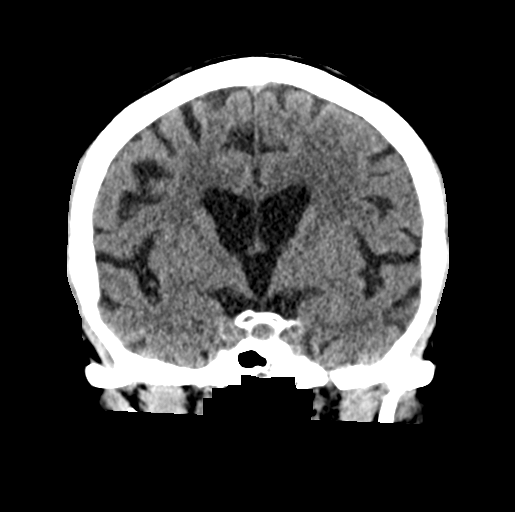
[im 37/67  brain]
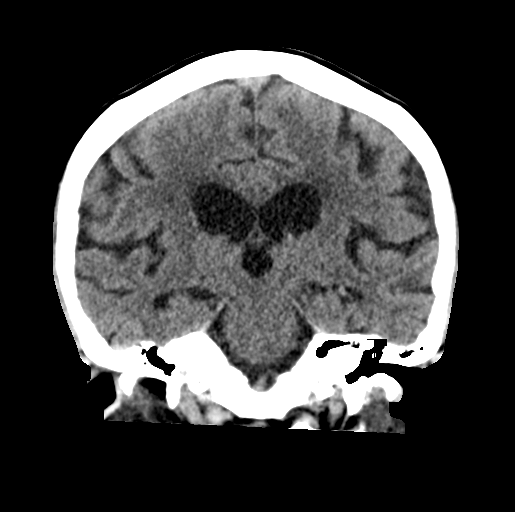

[Series 5: sagittal soft · sagittal · 0.38mm/px · 3 of 60 slices shown]
[im 20/60  brain]
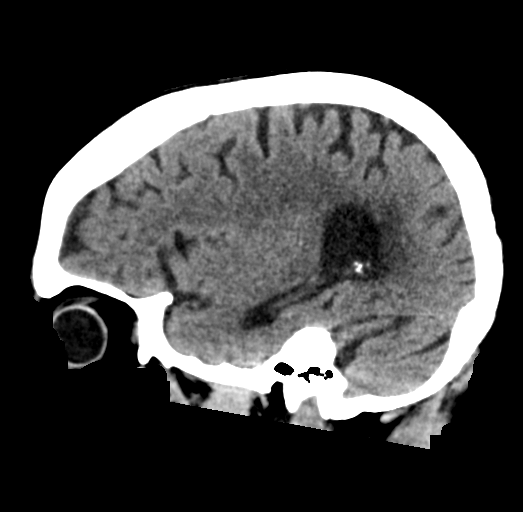
[im 30/60  brain]
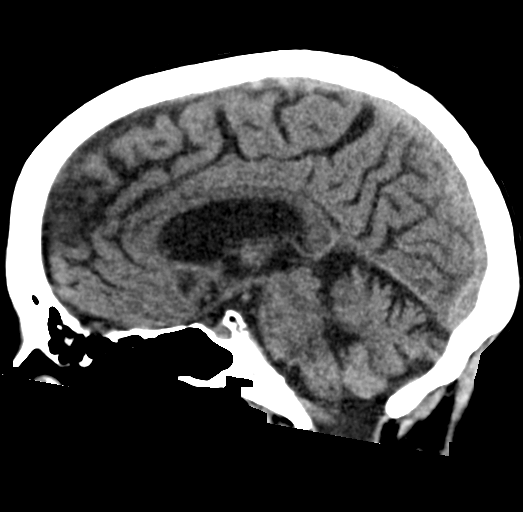
[im 40/60  brain]
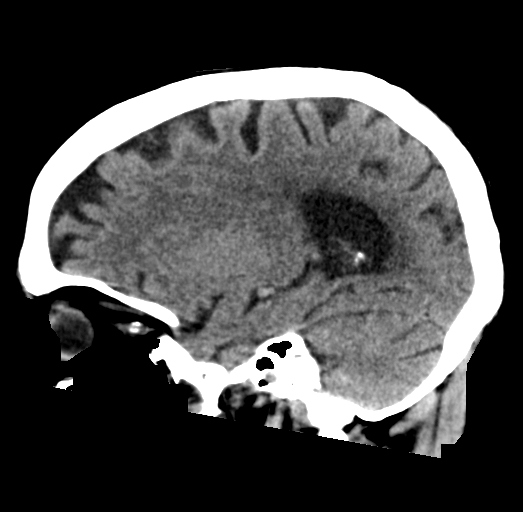

[17 of 47 positions shown; findings below may reference images not displayed]

FINDINGS: Brain: No evidence of acute infarction, hemorrhage, hydrocephalus,
extra-axial collection or mass lesion/mass effect. There is mild
diffuse atrophy. There is mild periventricular white matter
hypodensity, likely chronic small vessel ischemic change. This is
similar to the prior MRI he

Vascular: No hyperdense vessel or unexpected calcification.

Skull: Normal. Negative for fracture or focal lesion.

Sinuses/Orbits: No acute finding.

Other: There is left parietal scalp soft tissue swelling.
IMPRESSION: No acute intracranial abnormality.

## 2023-06-16 IMAGING — DX DG CHEST 2V
2 series · 3 of 3 positions shown · non-contrast
Comparison: None.

CLINICAL DATA: Fall

EXAM:
CHEST - 2 VIEW

[Series 2: chest lat · 0.14mm/px · 2 of 2 slices shown]
[im 1/2]
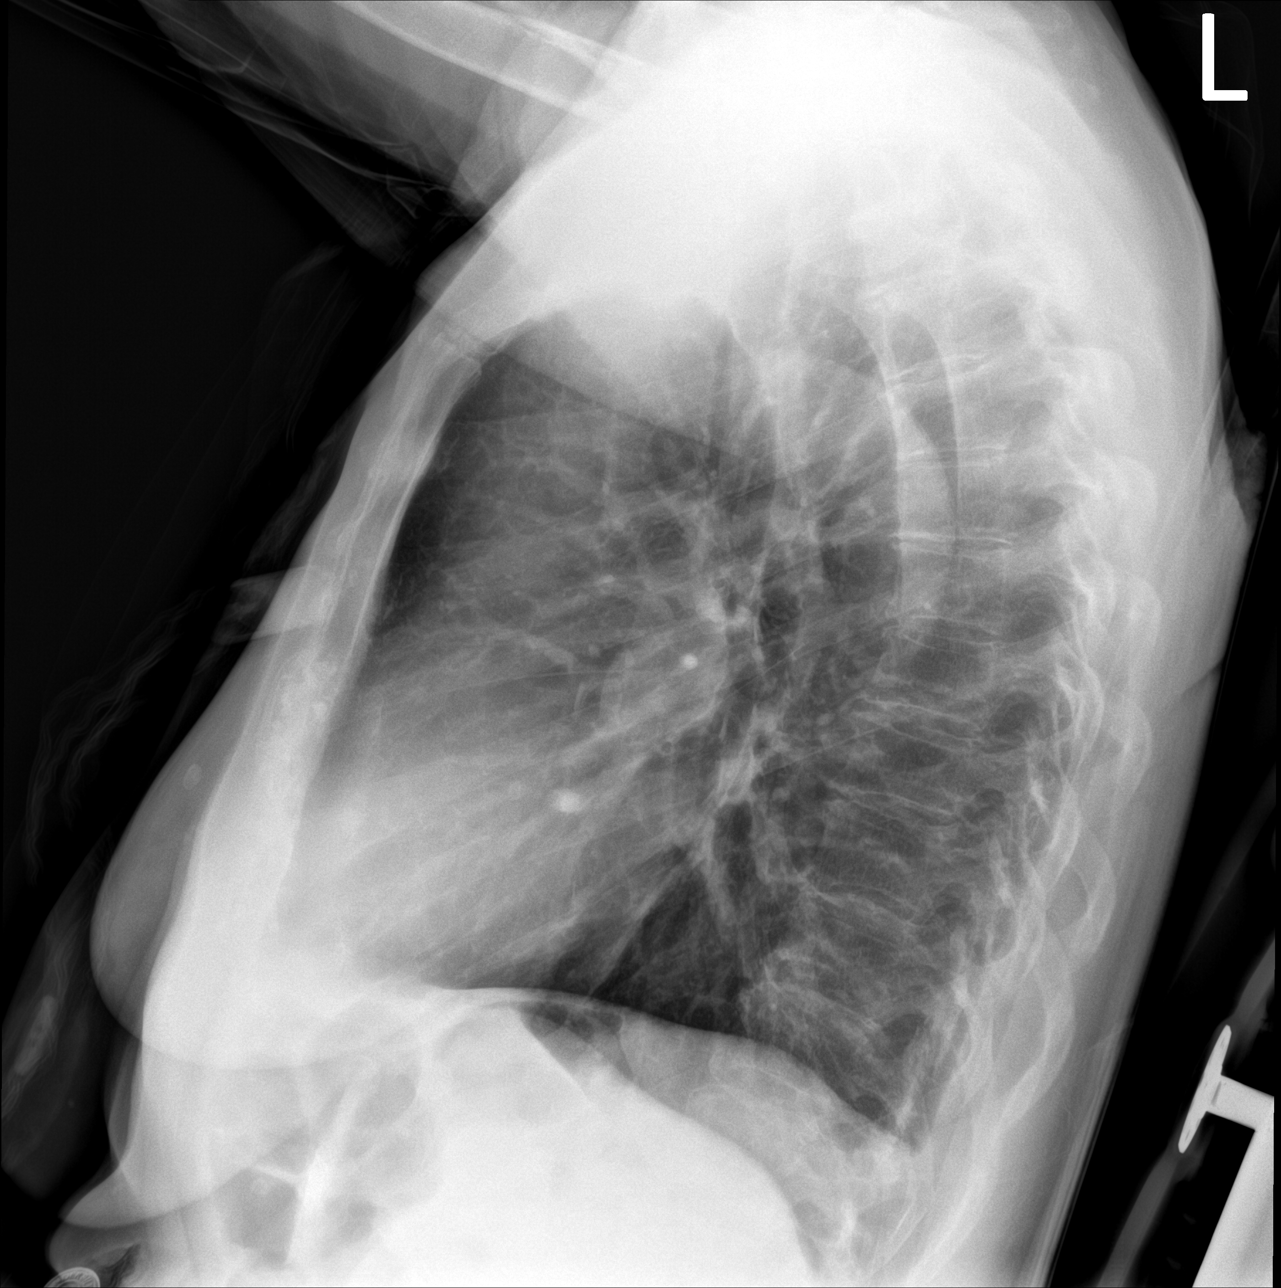
[im 2/2]
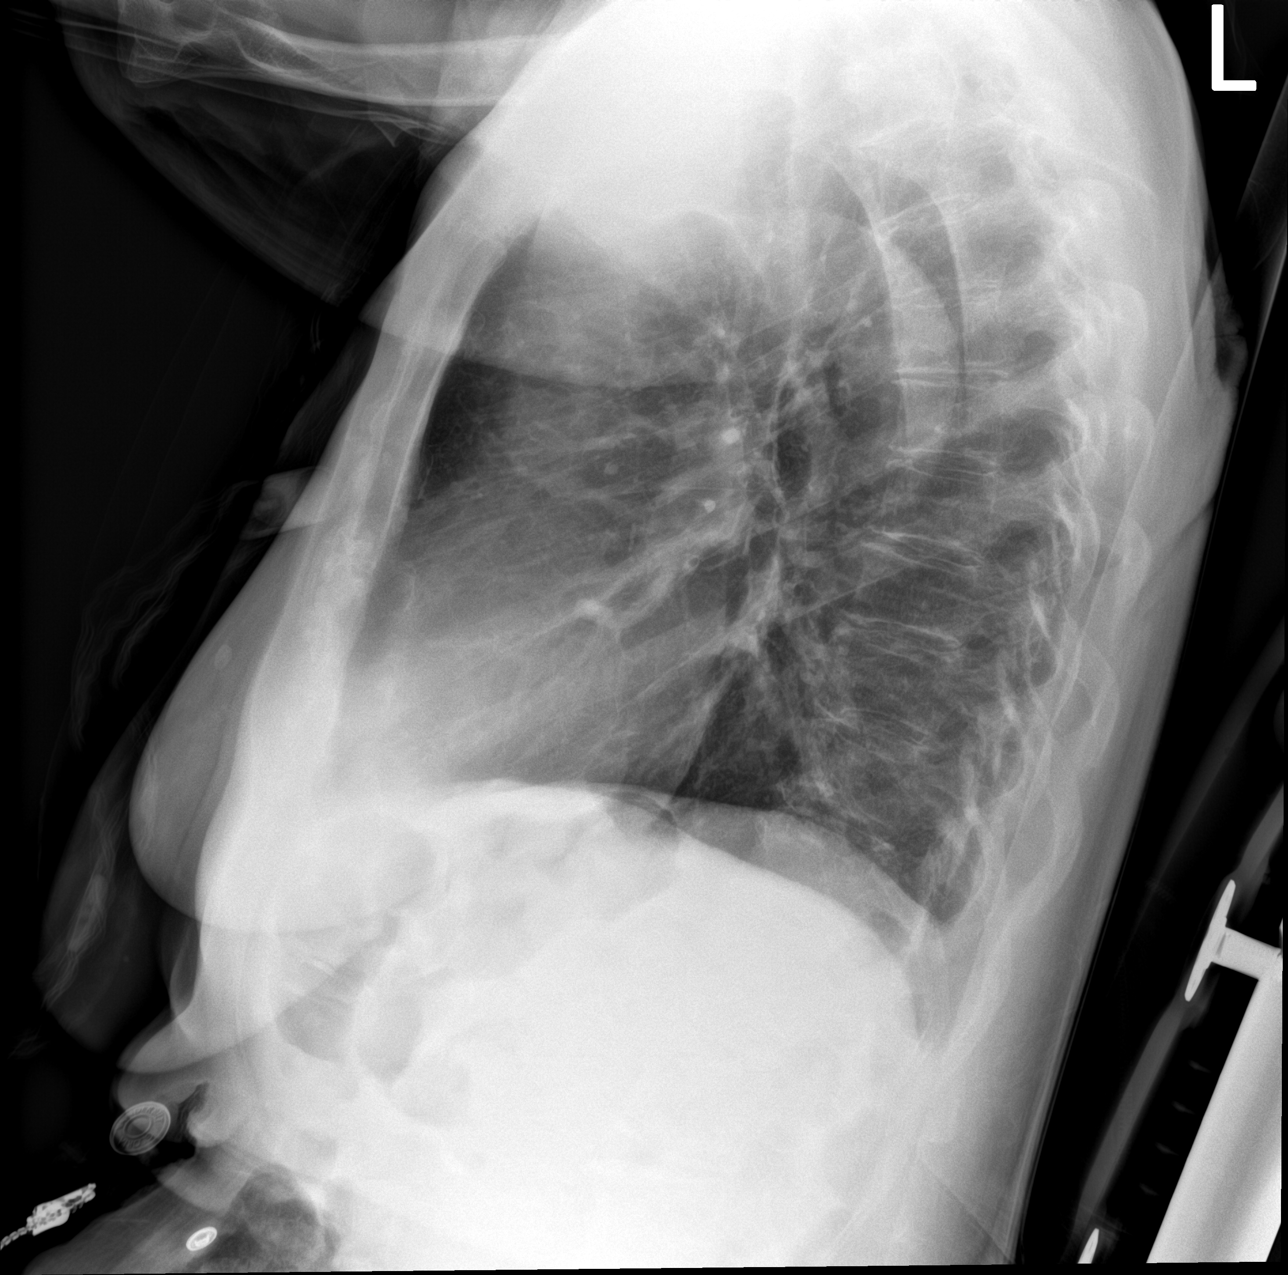

[chest ap]
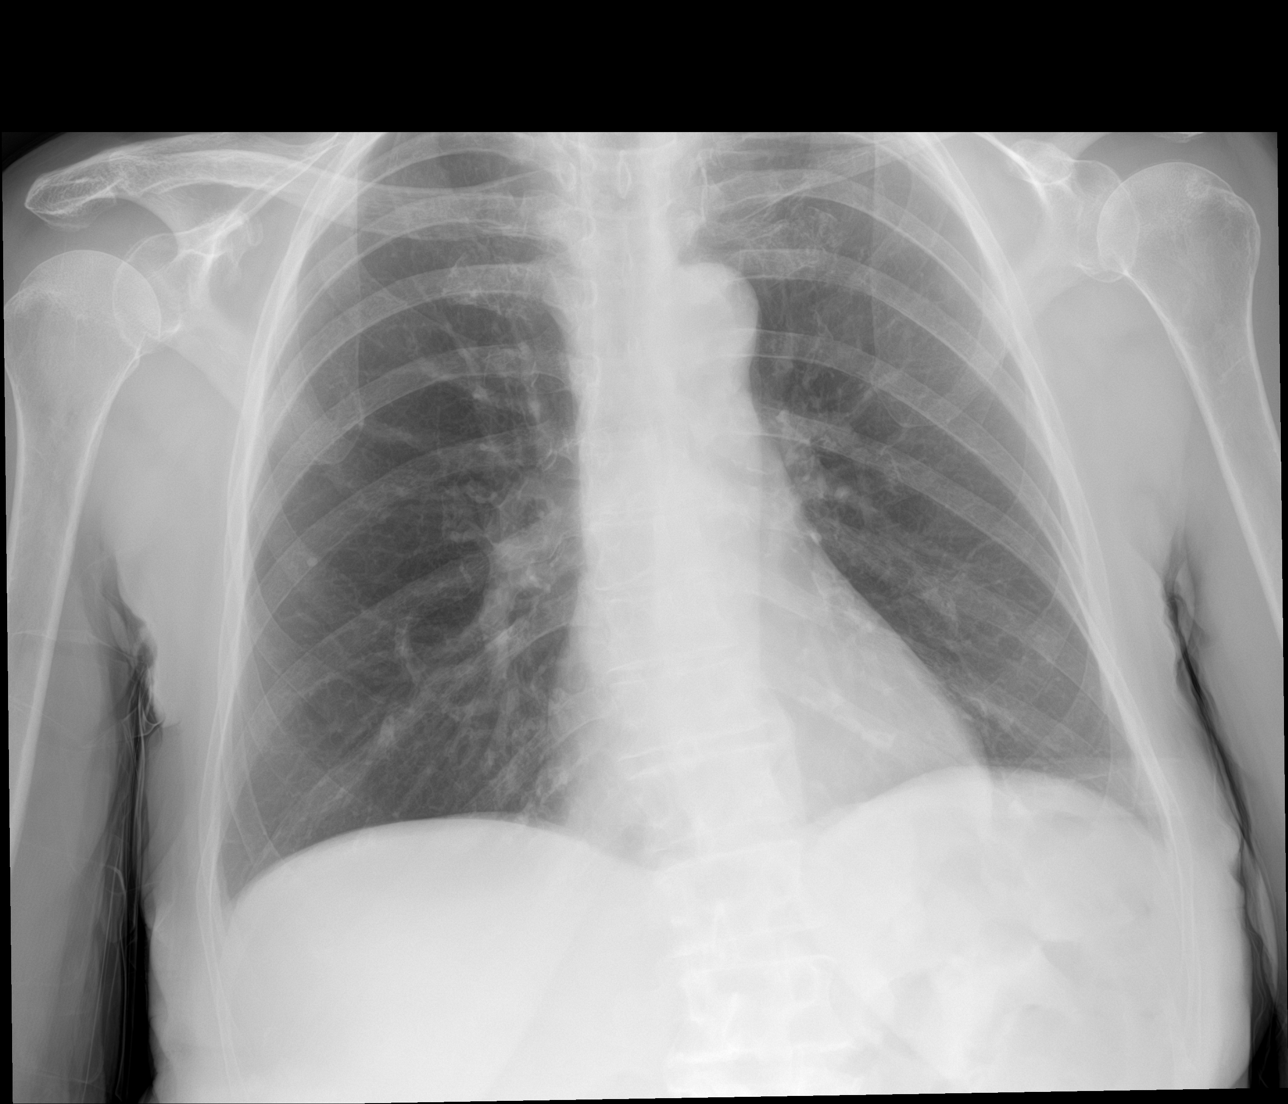

[3 of 3 positions shown; findings below may reference images not displayed]

FINDINGS: The heart size and mediastinal contours are within normal limits.
Both lungs are clear. The visualized skeletal structures are
unremarkable.
IMPRESSION: No active cardiopulmonary disease.

## 2023-06-16 IMAGING — DX DG SHOULDER 2+V*L*
3 series · 3 of 3 positions shown · non-contrast
Comparison: None.

CLINICAL DATA: Fall, injury

EXAM:
LEFT SHOULDER - 2+ VIEW

[shoulder grashey]
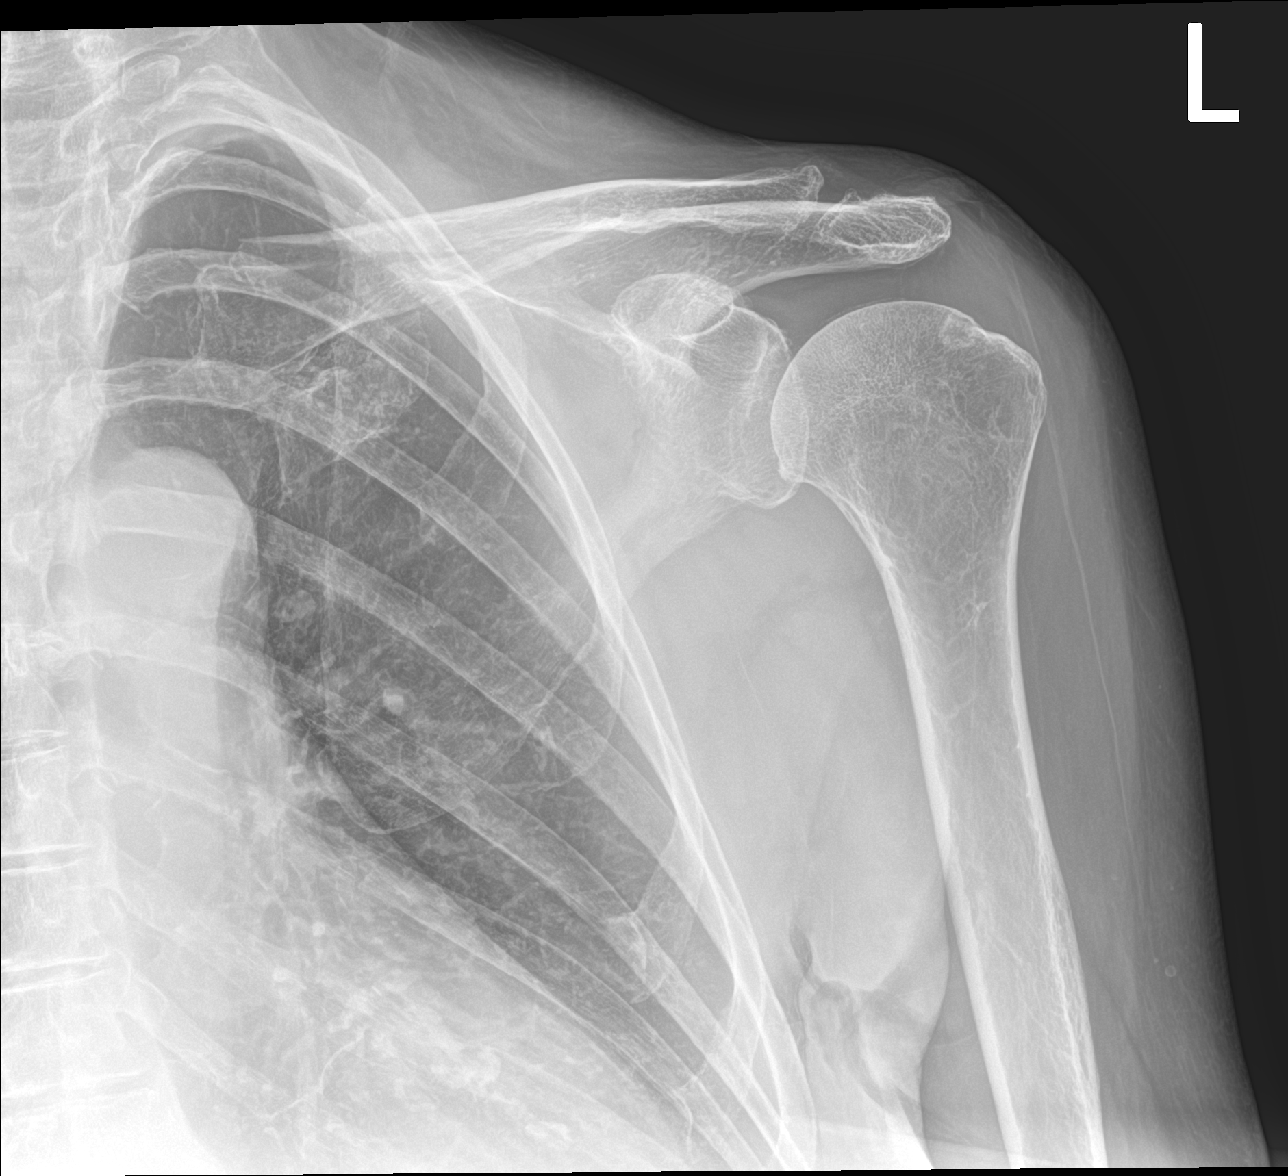

[shoulder y view]
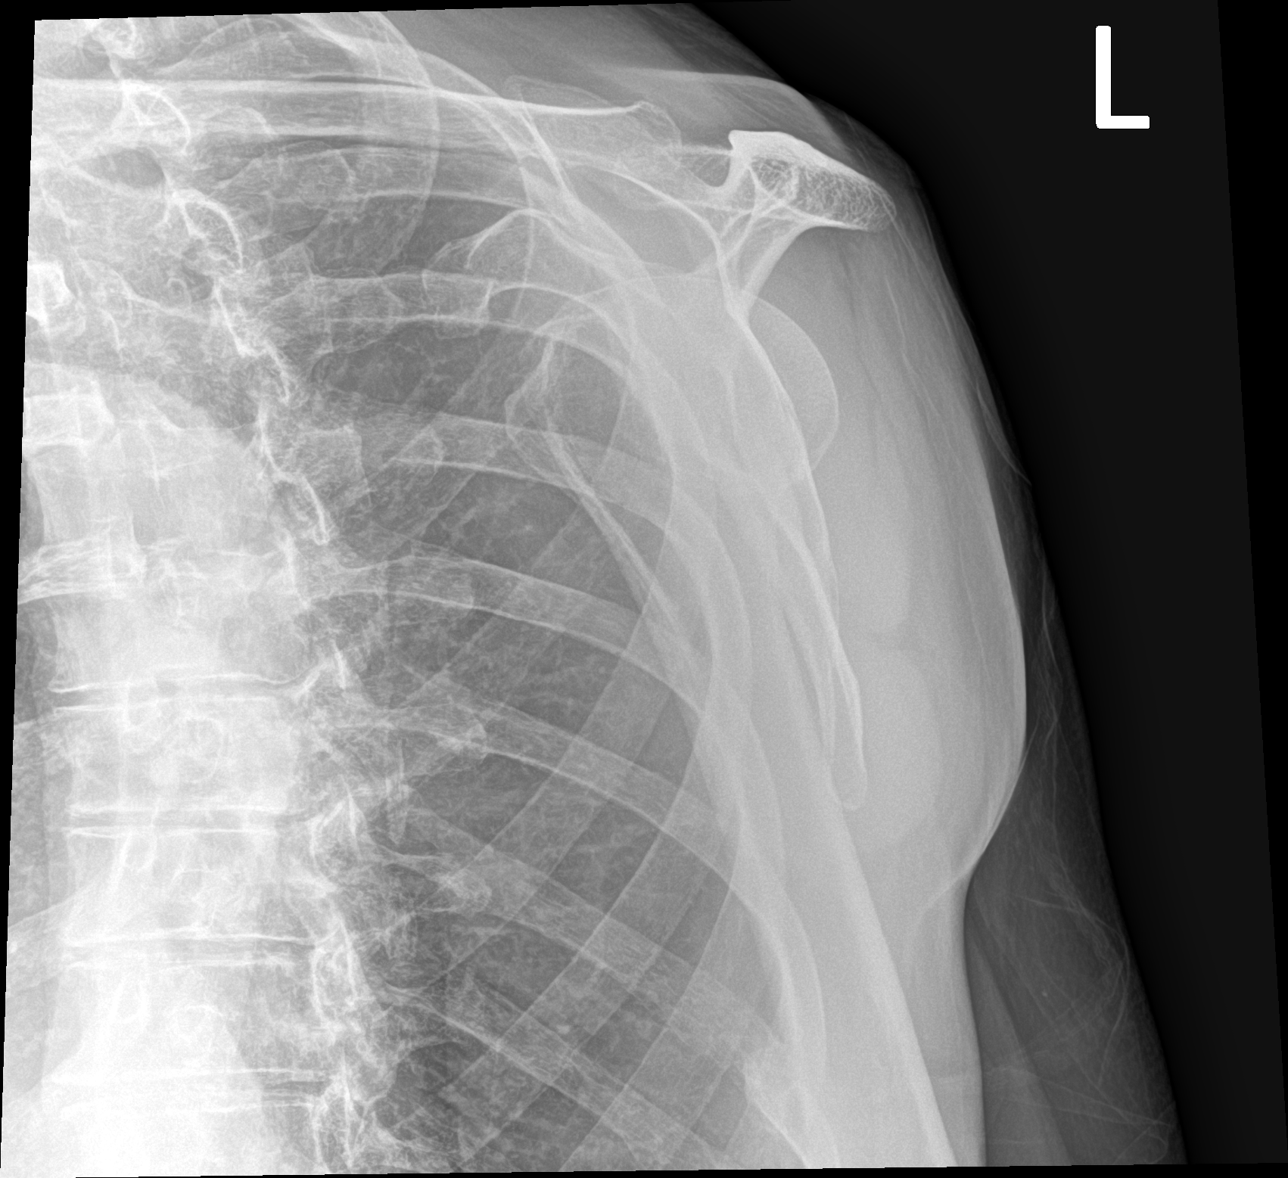

[shoulder axillary]
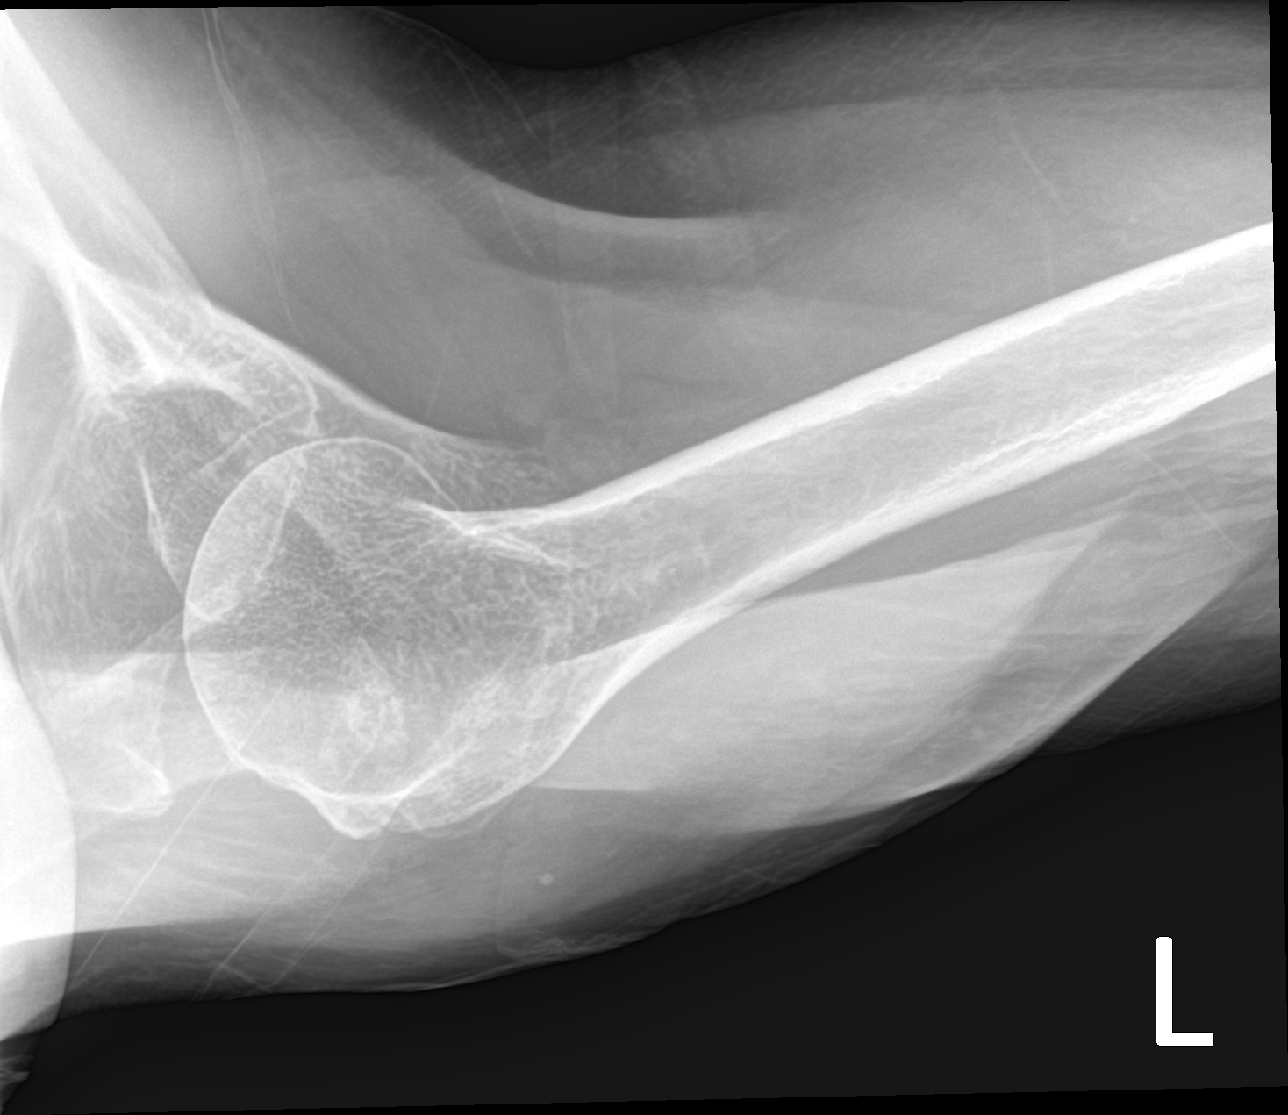

[3 of 3 positions shown; findings below may reference images not displayed]

FINDINGS: Mild AC joint degenerative change.  No fracture or malalignment.
IMPRESSION: No acute osseous abnormality
